# Patient Record
Sex: Male | Born: 1955
Health system: Southern US, Community
[De-identification: ages and names within clinical notes are randomized; demographics above are authoritative.]

## PROBLEM LIST (undated history)

## (undated) DIAGNOSIS — M4150 Other secondary scoliosis, site unspecified: Secondary | ICD-10-CM

## (undated) DIAGNOSIS — C439 Malignant melanoma of skin, unspecified: Secondary | ICD-10-CM

## (undated) DIAGNOSIS — M48061 Spinal stenosis, lumbar region without neurogenic claudication: Secondary | ICD-10-CM

## (undated) DIAGNOSIS — N200 Calculus of kidney: Secondary | ICD-10-CM

## (undated) DIAGNOSIS — K219 Gastro-esophageal reflux disease without esophagitis: Secondary | ICD-10-CM

## (undated) DIAGNOSIS — M48062 Spinal stenosis, lumbar region with neurogenic claudication: Secondary | ICD-10-CM

## (undated) DIAGNOSIS — IMO0002 Reserved for concepts with insufficient information to code with codable children: Secondary | ICD-10-CM

## (undated) DIAGNOSIS — Z87442 Personal history of urinary calculi: Secondary | ICD-10-CM

## (undated) DIAGNOSIS — N529 Male erectile dysfunction, unspecified: Secondary | ICD-10-CM

## (undated) DIAGNOSIS — K5790 Diverticulosis of intestine, part unspecified, without perforation or abscess without bleeding: Secondary | ICD-10-CM

## (undated) HISTORY — DX: Male erectile dysfunction, unspecified: N52.9

## (undated) HISTORY — PX: LUMBAR DISC SURGERY: SHX700

## (undated) HISTORY — DX: Reserved for concepts with insufficient information to code with codable children: IMO0002

## (undated) HISTORY — PX: MELANOMA EXCISION: SHX5266

## (undated) HISTORY — DX: Spinal stenosis, lumbar region with neurogenic claudication: M48.062

## (undated) HISTORY — DX: Spinal stenosis, lumbar region without neurogenic claudication: M48.061

## (undated) HISTORY — DX: Other secondary scoliosis, site unspecified: M41.50

## (undated) HISTORY — DX: Malignant melanoma of skin, unspecified: C43.9

## (undated) HISTORY — DX: Gastro-esophageal reflux disease without esophagitis: K21.9

## (undated) HISTORY — DX: Calculus of kidney: N20.0

## (undated) HISTORY — DX: Diverticulosis of intestine, part unspecified, without perforation or abscess without bleeding: K57.90

---

## 2004-10-05 ENCOUNTER — Encounter
Admission: RE | Admit: 2004-10-05 | Discharge: 2004-10-05 | Payer: Self-pay | Admitting: Physical Medicine and Rehabilitation

## 2005-04-06 ENCOUNTER — Emergency Department (HOSPITAL_COMMUNITY): Admission: EM | Admit: 2005-04-06 | Discharge: 2005-04-06 | Payer: Self-pay | Admitting: Emergency Medicine

## 2005-07-26 ENCOUNTER — Ambulatory Visit: Payer: Self-pay | Admitting: Gastroenterology

## 2005-08-09 ENCOUNTER — Ambulatory Visit: Payer: Self-pay | Admitting: Gastroenterology

## 2005-08-09 LAB — HM COLONOSCOPY

## 2006-02-05 ENCOUNTER — Ambulatory Visit: Payer: Self-pay | Admitting: Family Medicine

## 2006-03-26 HISTORY — PX: KNEE ARTHROSCOPY: SUR90

## 2006-10-04 ENCOUNTER — Encounter: Admission: RE | Admit: 2006-10-04 | Discharge: 2006-10-04 | Payer: Self-pay | Admitting: Orthopedic Surgery

## 2007-01-23 ENCOUNTER — Ambulatory Visit: Payer: Self-pay | Admitting: Family Medicine

## 2007-04-29 ENCOUNTER — Ambulatory Visit: Payer: Self-pay | Admitting: Family Medicine

## 2007-11-18 ENCOUNTER — Ambulatory Visit: Payer: Self-pay | Admitting: Family Medicine

## 2008-06-09 ENCOUNTER — Ambulatory Visit: Payer: Self-pay | Admitting: Family Medicine

## 2008-07-30 ENCOUNTER — Emergency Department (HOSPITAL_COMMUNITY): Admission: EM | Admit: 2008-07-30 | Discharge: 2008-07-30 | Payer: Self-pay | Admitting: Emergency Medicine

## 2008-08-02 ENCOUNTER — Ambulatory Visit: Payer: Self-pay | Admitting: Family Medicine

## 2008-11-15 ENCOUNTER — Ambulatory Visit: Payer: Self-pay | Admitting: Family Medicine

## 2009-03-11 ENCOUNTER — Ambulatory Visit: Payer: Self-pay | Admitting: Family Medicine

## 2009-04-20 ENCOUNTER — Ambulatory Visit: Payer: Self-pay | Admitting: Family Medicine

## 2010-04-19 ENCOUNTER — Ambulatory Visit
Admission: RE | Admit: 2010-04-19 | Discharge: 2010-04-19 | Payer: Self-pay | Source: Home / Self Care | Attending: Family Medicine | Admitting: Family Medicine

## 2010-07-04 LAB — CBC
HCT: 35.5 % — ABNORMAL LOW (ref 39.0–52.0)
Hemoglobin: 12.4 g/dL — ABNORMAL LOW (ref 13.0–17.0)
MCHC: 34.8 g/dL (ref 30.0–36.0)
MCV: 92.9 fL (ref 78.0–100.0)
Platelets: 288 10*3/uL (ref 150–400)
RBC: 3.82 MIL/uL — ABNORMAL LOW (ref 4.22–5.81)
RDW: 13.2 % (ref 11.5–15.5)
WBC: 12.9 10*3/uL — ABNORMAL HIGH (ref 4.0–10.5)

## 2010-07-04 LAB — URINALYSIS, ROUTINE W REFLEX MICROSCOPIC
Bilirubin Urine: NEGATIVE
Glucose, UA: NEGATIVE mg/dL
Ketones, ur: 40 mg/dL — AB
Leukocytes, UA: NEGATIVE
Nitrite: NEGATIVE
Protein, ur: NEGATIVE mg/dL
Specific Gravity, Urine: 1.023 (ref 1.005–1.030)
Urobilinogen, UA: 0.2 mg/dL (ref 0.0–1.0)
pH: 6 (ref 5.0–8.0)

## 2010-07-04 LAB — DIFFERENTIAL
Basophils Absolute: 0 10*3/uL (ref 0.0–0.1)
Basophils Relative: 0 % (ref 0–1)
Eosinophils Absolute: 0 10*3/uL (ref 0.0–0.7)
Eosinophils Relative: 0 % (ref 0–5)
Lymphocytes Relative: 8 % — ABNORMAL LOW (ref 12–46)
Lymphs Abs: 1 10*3/uL (ref 0.7–4.0)
Monocytes Absolute: 0.4 10*3/uL (ref 0.1–1.0)
Monocytes Relative: 3 % (ref 3–12)
Neutro Abs: 11.5 10*3/uL — ABNORMAL HIGH (ref 1.7–7.7)
Neutrophils Relative %: 89 % — ABNORMAL HIGH (ref 43–77)

## 2010-07-04 LAB — COMPREHENSIVE METABOLIC PANEL
ALT: 26 U/L (ref 0–53)
AST: 35 U/L (ref 0–37)
Albumin: 4.2 g/dL (ref 3.5–5.2)
Alkaline Phosphatase: 73 U/L (ref 39–117)
BUN: 13 mg/dL (ref 6–23)
CO2: 23 mEq/L (ref 19–32)
Calcium: 9 mg/dL (ref 8.4–10.5)
Chloride: 104 mEq/L (ref 96–112)
Creatinine, Ser: 1.31 mg/dL (ref 0.4–1.5)
GFR calc Af Amer: >60 mL/min (ref >60)
GFR calc non Af Amer: 57 mL/min — ABNORMAL LOW (ref >60)
Glucose, Bld: 144 mg/dL — ABNORMAL HIGH (ref 70–99)
Potassium: 4.3 mEq/L (ref 3.5–5.1)
Sodium: 134 mEq/L — ABNORMAL LOW (ref 135–145)
Total Bilirubin: 0.8 mg/dL (ref 0.3–1.2)
Total Protein: 6.3 g/dL (ref 6.0–8.3)

## 2010-07-04 LAB — URINE MICROSCOPIC-ADD ON

## 2011-01-18 ENCOUNTER — Encounter: Payer: Self-pay | Admitting: Family Medicine

## 2011-01-30 ENCOUNTER — Ambulatory Visit (INDEPENDENT_AMBULATORY_CARE_PROVIDER_SITE_OTHER): Payer: 59 | Admitting: Family Medicine

## 2011-01-30 ENCOUNTER — Encounter: Payer: Self-pay | Admitting: Family Medicine

## 2011-01-30 VITALS — BP 120/82 | HR 62 | Wt 186.0 lb

## 2011-01-30 DIAGNOSIS — M545 Low back pain: Secondary | ICD-10-CM

## 2011-01-30 MED ORDER — TRAMADOL HCL 50 MG PO TABS: 50.0000 mg | ORAL_TABLET | Freq: Four times a day (QID) | ORAL | Status: DC | PRN

## 2011-01-30 MED ORDER — CARISOPRODOL 350 MG PO TABS: 350.0000 mg | ORAL_TABLET | Freq: Three times a day (TID) | ORAL | Status: AC | PRN

## 2011-01-30 NOTE — Progress Notes (Signed)
  Subjective:    [name], male    DOB: 1956-02-06, 55 y.o.   MRN: [id number]  HPI Is here for evaluation of continued difficulty with a several month history of low back pain with radiation down his right leg. He has been through physical therapy. He also was seen by Dr. Charlett Blake and given a steroid dose pack. He continues to have back pain radiating down his right leg. He was diagnosed with scoliosis. The pain has gotten much worse in the last month he also complains of decreased sensation over the dorsum of his right foot but no motor weakness to he has a previous history of back surgery for left-sided HNP. In the last week his symptoms have been worse with increased pain with position changes. He continues on Celebrex but states it is not helping much.  Review of Systems     Objective:   Physical Exam Exam of the back shows no pain on palpation with pain on motion of his back. Negative straight leg raising. Decreased sensation over the dorsum of his foot. Ankle reflex on the right is normal in gone on the left. Normal strength.      Assessment & Plan:   1. Low back pain radiating to right leg  MR Lumbar Spine W Wo Contrast   I will also give him tramadol and soma. Probable referral to neurosurgery based on the MRI.

## 2011-02-08 ENCOUNTER — Ambulatory Visit
Admission: RE | Admit: 2011-02-08 | Discharge: 2011-02-08 | Disposition: A | Discharge status: Home / Self Care | Payer: 59 | Source: Ambulatory Visit | Attending: Family Medicine | Admitting: Family Medicine

## 2011-02-08 DIAGNOSIS — M545 Low back pain: Secondary | ICD-10-CM

## 2011-02-08 MED ORDER — GADOBENATE DIMEGLUMINE 529 MG/ML IV SOLN
17.0000 mL | Freq: Once | INTRAVENOUS | Status: AC | PRN
  Administered 2011-02-08: 17 mL via INTRAVENOUS

## 2011-02-10 NOTE — Progress Notes (Signed)
I can refer him to someone else,however most surgeons are reluctant to operate on someone who has already had an operation by another surgeon. Dr Dutch Quint is a good doc but if he wants, I'll refer to someone else and I'll talk to him if he wants

## 2011-03-22 ENCOUNTER — Telehealth: Payer: Self-pay | Admitting: Internal Medicine

## 2011-03-22 MED ORDER — CARISOPRODOL 350 MG PO TABS: 350.0000 mg | ORAL_TABLET | Freq: Three times a day (TID) | ORAL | Status: DC | PRN

## 2011-03-22 NOTE — Telephone Encounter (Signed)
Called soma in per jcl

## 2011-03-28 ENCOUNTER — Telehealth: Payer: Self-pay | Admitting: Internal Medicine

## 2011-03-28 MED ORDER — CARISOPRODOL 350 MG PO TABS: 350.0000 mg | ORAL_TABLET | Freq: Three times a day (TID) | ORAL | Status: AC | PRN

## 2011-03-28 MED ORDER — CELECOXIB 200 MG PO CAPS: 200.0000 mg | ORAL_CAPSULE | Freq: Two times a day (BID) | ORAL | Status: DC

## 2011-03-28 NOTE — Telephone Encounter (Signed)
CALLED MED IN  

## 2011-03-28 NOTE — Telephone Encounter (Signed)
CALLED MED IN PER JCL 

## 2011-03-28 NOTE — Telephone Encounter (Signed)
Call in both of these medications with 5 refills

## 2011-04-20 ENCOUNTER — Encounter: Payer: Self-pay | Admitting: Internal Medicine

## 2011-04-24 ENCOUNTER — Encounter: Payer: 59 | Admitting: Family Medicine

## 2011-04-26 ENCOUNTER — Encounter: Payer: Self-pay | Admitting: Family Medicine

## 2011-04-26 ENCOUNTER — Ambulatory Visit (INDEPENDENT_AMBULATORY_CARE_PROVIDER_SITE_OTHER): Payer: 59 | Admitting: Family Medicine

## 2011-04-26 VITALS — BP 122/80 | HR 76 | Ht >= 80 in | Wt 183.0 lb

## 2011-04-26 DIAGNOSIS — Z0000 Encounter for general adult medical examination without abnormal findings: Secondary | ICD-10-CM

## 2011-04-26 DIAGNOSIS — K219 Gastro-esophageal reflux disease without esophagitis: Secondary | ICD-10-CM

## 2011-04-26 DIAGNOSIS — G8929 Other chronic pain: Secondary | ICD-10-CM | POA: Insufficient documentation

## 2011-04-26 DIAGNOSIS — Z23 Encounter for immunization: Secondary | ICD-10-CM

## 2011-04-26 DIAGNOSIS — N529 Male erectile dysfunction, unspecified: Secondary | ICD-10-CM

## 2011-04-26 DIAGNOSIS — M549 Dorsalgia, unspecified: Secondary | ICD-10-CM

## 2011-04-26 DIAGNOSIS — Z87442 Personal history of urinary calculi: Secondary | ICD-10-CM

## 2011-04-26 LAB — POC HEMOCCULT BLD/STL (OFFICE/1-CARD/DIAGNOSTIC): Fecal Occult Blood, POC: NEGATIVE

## 2011-04-26 MED ORDER — CARISOPRODOL 350 MG PO TABS: 350.0000 mg | ORAL_TABLET | Freq: Four times a day (QID) | ORAL | Status: DC | PRN

## 2011-04-26 MED ORDER — CELECOXIB 200 MG PO CAPS: 200.0000 mg | ORAL_CAPSULE | Freq: Two times a day (BID) | ORAL | Status: DC

## 2011-04-26 MED ORDER — TADALAFIL 20 MG PO TABS: 20.0000 mg | ORAL_TABLET | Freq: Every day | ORAL | Status: DC | PRN

## 2011-04-26 NOTE — Progress Notes (Signed)
  Subjective:    [name], male    DOB: Jun 17, 1955, 56 y.o.   MRN: [id number]  HPI He is here for complete examination. He is having some URI symptoms with cough, nasal congestion, or rhinorrhea. His reflux is giving him very little difficulty. He would like to have a refill on his Cialis. He has a remote history of kidney stones. He continues to have difficulty with back pain and finds good relief with Celebrex and occasional use of soma. He continues on Propecia for his hair loss. Sees a dermatologist for this. Review of his record indicates his last lab work looked good including PSA   Review of Systems  Constitutional: Negative.   HENT: Negative.   Eyes: Negative.   Respiratory: Negative.   Gastrointestinal: Negative.   Genitourinary: Negative.   Musculoskeletal: Positive for back pain.  Neurological: Negative.   Hematological: Negative.   Psychiatric/Behavioral: Negative.        Objective:   Physical Exam BP 122/80  Pulse 76  Ht 6\' 8"  (2.032 m)  Wt 183 lb (83.008 kg)  BMI 20.10 kg/m2  General Appearance:    Alert, cooperative, no distress, appears stated age  Head:    Normocephalic, without obvious abnormality, atraumatic  Eyes:    PERRL, conjunctiva/corneas clear, EOM's intact, fundi    benign  Ears:    Normal TM's and external ear canals  Nose:   Nares normal, mucosa normal, no drainage or sinus   tenderness  Throat:   Lips, mucosa, and tongue normal; teeth and gums normal  Neck:   Supple, no lymphadenopathy;  thyroid:  no   enlargement/tenderness/nodules; no carotid   bruit or JVD  Back:    Spine nontender, no curvature, ROM normal, no CVA     tenderness  Lungs:     Clear to auscultation bilaterally without wheezes, rales or     ronchi; respirations unlabored  Chest Wall:    No tenderness or deformity   Heart:    Regular rate and rhythm, S1 and S2 normal, no murmur, rub   or gallop  Breast Exam:    No chest wall tenderness, masses or gynecomastia    Abdomen:     Soft, non-tender, nondistended, normoactive bowel sounds,    no masses, no hepatosplenomegaly  Genitalia:    Normal male external genitalia without lesions.  Testicles without masses.  No inguinal hernias.  Rectal:    Normal sphincter tone, no masses or tenderness; guaiac negative stool.  Prostate smooth, no nodules, not enlarged.  Extremities:   No clubbing, cyanosis or edema  Pulses:   2+ and symmetric all extremities  Skin:   Skin color, texture, turgor normal, no rashes or lesions  Lymph nodes:   Cervical, supraclavicular, and axillary nodes normal  Neurologic:   CNII-XII intact, normal strength, sensation and gait; reflexes 2+ and symmetric throughout          Psych:   Normal mood, affect, hygiene and grooming.           Assessment & Plan:   1. Routine general medical examination at a health care facility   2. GERD (gastroesophageal reflux disease)   3. ED (erectile dysfunction)   4. History of kidney stones   5. Chronic back pain   6  URI

## 2011-04-26 NOTE — Patient Instructions (Signed)
Continue taking good care of yourself

## 2011-05-17 ENCOUNTER — Encounter: Payer: Self-pay | Admitting: Family Medicine

## 2011-05-17 ENCOUNTER — Ambulatory Visit (INDEPENDENT_AMBULATORY_CARE_PROVIDER_SITE_OTHER): Payer: 59 | Admitting: Family Medicine

## 2011-05-17 VITALS — BP 120/82 | HR 80 | Temp 98.0°F | Wt 190.0 lb

## 2011-05-17 DIAGNOSIS — J209 Acute bronchitis, unspecified: Secondary | ICD-10-CM

## 2011-05-17 MED ORDER — AZITHROMYCIN 500 MG PO TABS: 500.0000 mg | ORAL_TABLET | Freq: Every day | ORAL | Status: AC

## 2011-05-17 NOTE — Progress Notes (Signed)
  Subjective:    [name], male    DOB: October 27, 1955, 56 y.o.   MRN: [id number]  HPI He has a 4 week history of sneezing,, hoarse voice, nasal congestion and dry cough. No fever, chills, headache or sore throat. He does not smoke.   Review of Systems     Objective:   Physical Exam alert and in no distress. Tympanic membranes and canals are normal. Throat is clear. Tonsils are normal. Neck is supple without adenopathy or thyromegaly. Cardiac exam shows a regular sinus rhythm without murmurs or gallops. Lungs are clear to auscultation.        Assessment & Plan:   1. Bronchitis, acute    I will treat him with azithromycin. He is to call if not entirely better.

## 2011-05-17 NOTE — Patient Instructions (Signed)
Take the antibiotic for 3 days but is still having difficulty 10 days down the road call me . Keep drinking plenty of fluids.

## 2011-09-24 ENCOUNTER — Other Ambulatory Visit: Payer: Self-pay | Admitting: Family Medicine

## 2011-11-06 ENCOUNTER — Other Ambulatory Visit: Payer: Self-pay | Admitting: Medical

## 2011-11-06 ENCOUNTER — Other Ambulatory Visit: Payer: Self-pay | Admitting: Family Medicine

## 2011-11-06 ENCOUNTER — Ambulatory Visit (INDEPENDENT_AMBULATORY_CARE_PROVIDER_SITE_OTHER): Payer: 59 | Admitting: Medical

## 2011-11-06 ENCOUNTER — Encounter: Payer: Self-pay | Admitting: Medical

## 2011-11-06 VITALS — BP 118/80 | HR 78 | Temp 98.3°F | Resp 16 | Wt 194.0 lb

## 2011-11-06 DIAGNOSIS — L0291 Cutaneous abscess, unspecified: Secondary | ICD-10-CM

## 2011-11-06 DIAGNOSIS — M7030 Other bursitis of elbow, unspecified elbow: Secondary | ICD-10-CM

## 2011-11-06 DIAGNOSIS — L0390 Cellulitis, unspecified: Secondary | ICD-10-CM

## 2011-11-06 DIAGNOSIS — M7020 Olecranon bursitis, unspecified elbow: Secondary | ICD-10-CM

## 2011-11-06 MED ORDER — SULFAMETHOXAZOLE-TRIMETHOPRIM 800-160 MG PO TABS: 1.0000 | ORAL_TABLET | Freq: Two times a day (BID) | ORAL | Status: AC

## 2011-11-06 NOTE — Progress Notes (Signed)
Subjective:   HPI  Joseph [name] is a 56 y.o. male who presents for c/o swollen elbow, low grade x 2wk, but worse in the last 2 days.  Was trimming hedges with hand clipper the last 2 days, and now the elbow is swollen and red.   Thinks its bursitis.   Has had similar in the past, thinks he was on steroid in the past for this.  Is swollen and tender, can't rest the elbow on anything.  Using nothing for symptoms.  He does use Celebrex routinely for joint pains but its not helping.  No other aggravating or relieving factors.  No recent trauma or injury.  He reports seeing dermatology in the last month for scalp lesions was treated with 2 rounds of doxycyline for staph infection.  No other c/o.  The following portions of the patient's history were reviewed and updated as appropriate: allergies, current medications, past family history, past medical history, past social history, past surgical history and problem list.  Past Medical History  Diagnosis Date  . DDD (degenerative disc disease)   . Myeloma   . GERD (gastroesophageal reflux disease)   . Alopecia   . ED (erectile dysfunction)   . Diverticulosis   . Renal stone     Allergies  Allergen Reactions  . Daypro (Oxaprozin)     GASTRIC PROBLEMS  . Methadone Itching     Review of Systems Gen: no fever, chills GI: no NVD MSK: did notice some drainage ROS reviewed and was negative other than noted in HPI or above.    Objective:   Physical Exam  General appearance: alert, no distress, WD/WN Skin: left elbow with scaling, generalized warmth and erythema MSK: swollen left elbow with fluctuance, pain with elbow flexion, tender over same area, forearm seems somewhat swollen compared to right, otherwise arm nontender, unremarkable Neuro: normal left arm strength and sensation LE pulses normal   Assessment and Plan :     Encounter Diagnoses  Name Primary?  . Bursitis of elbow Yes  . Cellulitis    Discussed case with Dr.  Susann Givens who also examined pt.  Discussed diagnosis, treatment options.   We suggested aspiration.   discussed risks/benefits and he agrees to aspiration.   Cleaned and prepped the left elbow in usual sterile fashion.  Use 18 gauge needle to aspirate approx 3cc of serous fluid from the left elbow fluctuance area over the bursa.   Covered with bandage and pt tolerated procedure well.   Procedure supervised by Dr. Susann Givens.  Begin Bactrim, use warm compresses, elevation, and compression with ACE wrap OTC.  wil send fluid for culture and cytology.  F/u in 2-3 days, call sooner if worse.

## 2011-11-07 LAB — SYNOVIAL CELL COUNT + DIFF, W/ CRYSTALS
Eosinophils-Synovial: 0 % (ref 0–1)
Lymphocytes-Synovial Fld: 9 % (ref 0–20)
Monocyte/Macrophage: 6 % — ABNORMAL LOW (ref 50–90)
Neutrophil, Synovial: 85 % — ABNORMAL HIGH (ref 0–25)
WBC, Synovial: 850 cu mm — ABNORMAL HIGH (ref 0–200)

## 2011-11-08 ENCOUNTER — Ambulatory Visit (INDEPENDENT_AMBULATORY_CARE_PROVIDER_SITE_OTHER): Payer: 59 | Admitting: Medical

## 2011-11-08 ENCOUNTER — Encounter: Payer: Self-pay | Admitting: Medical

## 2011-11-08 VITALS — BP 112/70 | HR 88 | Temp 98.0°F | Resp 16

## 2011-11-08 DIAGNOSIS — L0390 Cellulitis, unspecified: Secondary | ICD-10-CM

## 2011-11-08 DIAGNOSIS — M7020 Olecranon bursitis, unspecified elbow: Secondary | ICD-10-CM

## 2011-11-08 DIAGNOSIS — L0291 Cutaneous abscess, unspecified: Secondary | ICD-10-CM

## 2011-11-08 NOTE — Progress Notes (Signed)
Subjective:   HPI  Joseph [name] is a 56 y.o. male who presents for recheck.  I saw him 2 days ago for swollen left elbow suggesting cellulitis and inflamed bursa.  He has used heat a few times, has taken 5 doses of Bactrim now, but has not used compression or elevation.   He feels like it is somewhat improved.  still swollen and a little red.  No other aggravating or relieving factors.  No recent trauma or injury.   No other c/o.  The following portions of the patient's history were reviewed and updated as appropriate: allergies, current medications, past family history, past medical history, past social history, past surgical history and problem list.  Past Medical History  Diagnosis Date  . DDD (degenerative disc disease)   . Myeloma   . GERD (gastroesophageal reflux disease)   . Alopecia   . ED (erectile dysfunction)   . Diverticulosis   . Renal stone     Allergies  Allergen Reactions  . Daypro (Oxaprozin)     GASTRIC PROBLEMS  . Methadone Itching     Review of Systems Gen: no fever, chills GI: no NVD MSK: did notice some drainage ROS reviewed and was negative other than noted in HPI or above.    Objective:   Physical Exam  General appearance: alert, no distress, WD/WN Skin: left elbow with scaling, less warmth and erythema today, more pinkish appearing today MSK: moderately swollen left elbow with fluctuance over the olecranon bursa, mild pain with elbow flexion, tender over same area, forearm seems somewhat swollen compared to right, otherwise arm nontender, unremarkable Neuro: normal left arm strength and sensation LE pulses normal   Assessment and Plan :     Encounter Diagnoses  Name Primary?  Marland Kitchen Olecranon bursitis Yes  . Cellulitis    Improving.  I reviewed the cytology report.  Still awaiting culture.   c/t Bactrim, use warm compresses, elevation, and compression with ACE wrap OTC.  I applied the ACE wrap today.   If not improved in 24 hours, call back.   Otherwise, stay the course and f/u pending culture results or return if worse sooner.

## 2011-11-10 LAB — BODY FLUID CULTURE
Gram Stain: NONE SEEN
Gram Stain: NONE SEEN
Organism ID, Bacteria: NO GROWTH

## 2012-03-03 ENCOUNTER — Other Ambulatory Visit: Payer: Self-pay | Admitting: Family Medicine

## 2012-03-03 NOTE — Telephone Encounter (Signed)
Is this okay?

## 2012-03-03 NOTE — Telephone Encounter (Signed)
ok 

## 2012-03-04 NOTE — Telephone Encounter (Signed)
Called in med 

## 2012-03-29 ENCOUNTER — Other Ambulatory Visit: Payer: Self-pay | Admitting: Family Medicine

## 2012-03-31 NOTE — Telephone Encounter (Signed)
IS THIS OK 

## 2012-04-08 ENCOUNTER — Encounter: Payer: Self-pay | Admitting: Family Medicine

## 2012-04-08 ENCOUNTER — Ambulatory Visit (INDEPENDENT_AMBULATORY_CARE_PROVIDER_SITE_OTHER): Payer: 59 | Admitting: Family Medicine

## 2012-04-08 VITALS — BP 112/78 | HR 86 | Wt 203.0 lb

## 2012-04-08 DIAGNOSIS — M722 Plantar fascial fibromatosis: Secondary | ICD-10-CM

## 2012-04-08 NOTE — Progress Notes (Signed)
  Subjective:    [name], male    DOB: Nov 18, 1955, 57 y.o.   MRN: [id number]  HPI He complains of a two-week history of right heel pain. He murmurs no particular injury but did note that this occurred after he did some brisk walking for short period of time. He notes the pain is worse in the morning when he gets out of bed or if he sits for long period of time.  Review of Systems     Objective:   Physical Exam No tenderness to palpation of the Achilles tendon, retrocalcaneal area. Negative Homan's sign. Tenderness to palpation over the calcaneal spur. Good motion of the foot and forefoot.      Assessment & Plan:   1. Plantar fasciitis of right foot    instructions on the heel cord stretching, kneeding it in the calcaneal area, using heel cups and if no pubic arch supports. He will return here if continued difficulty.

## 2012-04-08 NOTE — Patient Instructions (Signed)

## 2012-09-30 ENCOUNTER — Encounter: Payer: Self-pay | Admitting: Medical

## 2012-09-30 ENCOUNTER — Ambulatory Visit (INDEPENDENT_AMBULATORY_CARE_PROVIDER_SITE_OTHER): Payer: 59 | Admitting: Medical

## 2012-09-30 VITALS — BP 110/80 | HR 78 | Temp 98.3°F | Resp 16 | Wt 202.0 lb

## 2012-09-30 DIAGNOSIS — J069 Acute upper respiratory infection, unspecified: Secondary | ICD-10-CM

## 2012-09-30 NOTE — Progress Notes (Signed)
Subjective:  Joseph [name] is a 57 y.o. male who presents for 3 wk hx/o cough, intermittent, some nasal congestion, some sneezing, phlegm in throat.  No fever, NVD, wheezing, SOB, ear pressure, sore throat.  Does have cat in the home.  Has had some sick contacts.   Treatment to date: none.   No other aggravating or relieving factors.  No other c/o.  Past Medical History  Diagnosis Date  . DDD (degenerative disc disease)   . Myeloma   . GERD (gastroesophageal reflux disease)   . Alopecia   . ED (erectile dysfunction)   . Diverticulosis   . Renal stone    ROS as in subjective  Objective:  Filed Vitals:   09/30/12 1438  BP: 110/80  Pulse: 78  Temp: 98.3 F (36.8 C)  Resp: 16    General appearance: Alert, WD/WN, no distress                             Skin: warm, no rash                           Head: no sinus tenderness                            Eyes: conjunctiva normal, corneas clear, PERRLA                            Ears: pearly TMs, external ear canals normal                          Nose: septum midline, turbinates normal, with mild erythema and crusted dry discharge             Mouth/throat: MMM, tongue normal, mild pharyngeal erythema                           Neck: supple, no adenopathy, no thyromegaly, nontender                          Heart: RRR, normal S1, S2, no murmurs                         Lungs: CTA bilaterally, no wheezes, rales, or rhonchi     Assessment and Plan: Encounter Diagnosis  Name Primary?  Marland Kitchen Upper respiratory infection Yes    Discussed diagnosis and treatment of URI.  Suggested symptomatic OTC remedies, begin OTC mucinex DM, nasal saline flush.  Call or return in 2-3 days if symptoms aren't resolving.

## 2012-10-13 ENCOUNTER — Other Ambulatory Visit: Payer: Self-pay | Admitting: Family Medicine

## 2012-12-12 ENCOUNTER — Other Ambulatory Visit: Payer: Self-pay | Admitting: Family Medicine

## 2012-12-15 NOTE — Telephone Encounter (Signed)
IS THIS OK 

## 2012-12-15 NOTE — Telephone Encounter (Signed)
Okay to renew this

## 2013-02-12 ENCOUNTER — Ambulatory Visit (INDEPENDENT_AMBULATORY_CARE_PROVIDER_SITE_OTHER): Payer: 59 | Admitting: Family Medicine

## 2013-02-12 ENCOUNTER — Encounter: Payer: Self-pay | Admitting: Family Medicine

## 2013-02-12 VITALS — BP 122/86 | HR 72 | Temp 97.9°F | Ht 77.5 in | Wt 203.0 lb

## 2013-02-12 DIAGNOSIS — Z23 Encounter for immunization: Secondary | ICD-10-CM

## 2013-02-12 DIAGNOSIS — J069 Acute upper respiratory infection, unspecified: Secondary | ICD-10-CM

## 2013-02-12 NOTE — Patient Instructions (Signed)
  Drink plenty of fluids Continue to use guaifenesin as expectorant to loosen phlegm. Use nasal saline to keep mucus membranes moist and prevent bleeding. Consider use of sinus rinses once or twice daily. Use decongestants only if you develop sinus pain. Okay to continue dextromethorphan as cough suppressant (Nyquil at bedtime is fine, but don't use along with the guaif-DM combination or you will be getting too much DM).  Call next week if symptoms persist/worsen  Return for re-evaluation if high fevers, shortness of breath, chest pain, or other new concerns develop

## 2013-02-12 NOTE — Progress Notes (Signed)
Chief Complaint  Patient presents with  . Cough    hoarseness, sneezing, has mucus that is yellow-green and sometimes blood tinged. Some real milky white stuff when he blows his nose.    He has been sick for a week with cough, yellow-green phlegm with occasional streak of blood.  He has had hoarse voice, sneezing, nasal congestion.  Nasal drainage is milky white along with yellow-green mucus., sometimes with bloody streaks.  +PND.  He has had pressure over his temples.  Denies fevers, ear pain, sore throat, wheezing, shortness of breath.  No myalgias.  No sinus pain.  Over the last week symptoms haven't improved much, hasn't worsened, some days are better than others.  Feels better today, slept well last night.  He has h/o pneumonia, last was a few years ago. +sick contacts at work  He has been taking Mucinex-DM during day, and Nyquil (generic, containing just doxylamine and dextromethorphan) at night  Past Medical History  Diagnosis Date  . DDD (degenerative disc disease)   . Melanoma     sees Dr. Margo Aye.  back and chest  . GERD (gastroesophageal reflux disease)   . Alopecia   . ED (erectile dysfunction)   . Diverticulosis   . Renal stone    Past Surgical History  Procedure Laterality Date  . Knee arthroscopy  2008    RIGHT  . Melanoma excision    . Lumbar disc surgery     History   Social History  . Marital Status: Married    Spouse Name: N/A    Number of Children: N/A  . Years of Education: N/A   Occupational History  . Not on file.   Social History Main Topics  . Smoking status: Former Smoker -- 1.00 packs/day    Quit date: 04/25/1994  . Smokeless tobacco: Not on file  . Alcohol Use: 2.4 oz/week    4 Cans of beer per week  . Drug Use: No  . Sexual Activity: Yes   Other Topics Concern  . Not on file   Social History Narrative  . No narrative on file     Current outpatient prescriptions:carisoprodol (SOMA) 350 MG tablet, TAKE 1 TABLET BY MOUTH 3 TIMES A DAY  AS NEEDED FOR MUSCLE SPASM, Disp: 30 tablet, Rfl: 2;  CELEBREX 200 MG capsule, TAKE 1 CAPSULE BY MOUTH TWICE DAILY, Disp: 60 capsule, Rfl: 5;  cholecalciferol (VITAMIN D) 1000 UNITS tablet, Take 1,000 Units by mouth daily., Disp: , Rfl:  dextromethorphan (DELSYM) 30 MG/5ML liquid, Take 30 mg by mouth at bedtime as needed for cough., Disp: , Rfl: ;  dextromethorphan-guaiFENesin (MUCINEX DM) 30-600 MG per 12 hr tablet, Take 1 tablet by mouth 2 (two) times daily., Disp: , Rfl: ;  finasteride (PROPECIA) 1 MG tablet, Take 1 mg by mouth daily. 1/2 TABLET DAILY , Disp: , Rfl: ;  fish oil-omega-3 fatty acids 1000 MG capsule, Take 2 g by mouth daily.  , Disp: , Rfl:  glucosamine-chondroitin 500-400 MG tablet, Take 1 tablet by mouth 3 (three) times daily.  , Disp: , Rfl: ;  Multiple Vitamins-Minerals (MULTIVITAMIN WITH MINERALS) tablet, Take 1 tablet by mouth daily.  , Disp: , Rfl: ;  selenium 50 MCG TABS, Take 50 mcg by mouth daily.  , Disp: , Rfl: ;  vitamin C (ASCORBIC ACID) 500 MG tablet, Take 500 mg by mouth daily., Disp: , Rfl: ;  vitamin E 100 UNIT capsule, Take 100 Units by mouth daily., Disp: , Rfl:  ferrous sulfate  325 (65 FE) MG tablet, Take 325 mg by mouth daily with breakfast.  , Disp: , Rfl:  (not taking iron)   Allergies  Allergen Reactions  . Daypro [Oxaprozin]     GASTRIC PROBLEMS  . Methadone Itching   ROS:  No fevers, chills, chest pain, shortness of breath, nausea, vomiting, diarrhea or abdominal pain. No skin rashes, chest pain, palpitations, edema or other concerns.  PHYSICAL EXAM:  BP 122/86  Pulse 72  Temp(Src) 97.9 F (36.6 C) (Oral)  Ht 6' 5.5" (1.969 m)  Wt 203 lb (92.08 kg)  BMI 23.75 kg/m2  Well developed, pleasant male with slightly hoarse voice, no coughing, in no distress HEENT: PERRL, EOMI, conjunctiva clear.  TM's and EAC's normal. Nasal mucosa mildly edematous, with irritation and recent bleed at septum on right.  Thick white mucus in left nares.  Sinuses nontender.   OP clear Neck: no lymphadenopathy, thyromegaly Heart: regular rate and rhythm without murmur Lungs: clear bilaterally, no wheezes, rales or ronchi.  No wheeze with forced expiration Skin: no rash Neuro: alert and oriented.  Cranial nerves intact. Normal strength, gait  ASSESSMENT/PLAN:  Acute upper respiratory infections of unspecified site  Need for prophylactic vaccination and inoculation against influenza - Plan: Flu Vaccine QUAD 36+ mos PF IM (Fluarix)  Drink plenty of fluids Continue to use guaifenesin as expectorant to loosen phlegm. Use nasal saline to keep mucus membranes moist and prevent bleeding. Consider use of sinus rinses once or twice daily. Use decongestants only if you develop sinus pain. Okay to continue dextromethorphan as cough suppressant (Nyquil at bedtime is fine, but don't use along with the guaif-DM combination or you will be getting too much DM).  Call next week if symptoms persist/worsen  Return for re-evaluation if high fevers, shortness of breath, chest pain, or other new concerns develop

## 2013-03-23 ENCOUNTER — Ambulatory Visit (INDEPENDENT_AMBULATORY_CARE_PROVIDER_SITE_OTHER): Payer: 59 | Admitting: Medical

## 2013-03-23 ENCOUNTER — Encounter: Payer: Self-pay | Admitting: Medical

## 2013-03-23 VITALS — BP 130/80 | HR 68 | Temp 98.0°F | Resp 16 | Wt 203.0 lb

## 2013-03-23 DIAGNOSIS — R05 Cough: Secondary | ICD-10-CM

## 2013-03-23 MED ORDER — ALBUTEROL SULFATE HFA 108 (90 BASE) MCG/ACT IN AERS: 2.0000 | INHALATION_SPRAY | Freq: Four times a day (QID) | RESPIRATORY_TRACT | Status: DC | PRN

## 2013-03-23 NOTE — Progress Notes (Signed)
Subjective: Here for cough x 2 wk.  Was seen here the week of Thanksgiving for cough, got over this, then about 2 weeks later started getting cough again.  He is a nonsmoker. Currently he is having cough throughout the day, at times bad cough spells, some nasal congestion, occasional sneezing. Does get up some phlegm.  He denies fever, sore throat, ear pain, nausea or vomiting. At times feels a little wheezy.  Denies chest pain, leg swelling.  No sick contacts. 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  Past Medical History  Diagnosis Date  . DDD (degenerative disc disease)   . Melanoma     sees Dr. Margo Aye.  back and chest  . GERD (gastroesophageal reflux disease)   . Alopecia   . ED (erectile dysfunction)   . Diverticulosis   . Renal stone    Review of systems as in subjective  Objective: Filed Vitals:   03/23/13 1153  BP: 130/80  Pulse: 68  Temp: 98 F (36.7 C)  Resp: 16    General appearance: alert, no distress, WD/WN HEENT: normocephalic, sclerae anicteric, TMs pearly, nares patent, no discharge or erythema, pharynx normal Oral cavity: MMM, no lesions Neck: supple, no lymphadenopathy, no thyromegaly, no masses Heart: RRR, normal S1, S2, no murmurs Lungs: CTA bilaterally, no wheezes, rhonchi, or rales Pulses: 2+ symmetric, upper and lower extremities, normal cap refill Extremities: No edema  Assessment: Encounter Diagnosis  Name Primary?  . Cough Yes    Plan: We discussed the differential for cough. At this time no obvious pneumonia, lung exam is clear, and based on environmental exposures, this could be allergen induced.  He will continue over-the-counter antihistamine for the next few weeks, he can still use Mucinex the next several days, increase water intake, and short-term will have him use a trial of albuterol inhaler.  Will treat presumptively for allergic bronchitis/reactive airway.  I gave him an inhaler and demonstrated  proper use today. He is to call back by Friday give me an update, however if worse in the meantime, call or return.

## 2013-03-23 NOTE — Addendum Note (Signed)
Addended by: Jac Canavan on: 03/23/2013 01:36 PM   Modules accepted: Orders

## 2013-04-14 ENCOUNTER — Other Ambulatory Visit: Payer: Self-pay | Admitting: Family Medicine

## 2013-04-16 NOTE — Telephone Encounter (Signed)
Is this okay to fill? 

## 2013-08-31 ENCOUNTER — Ambulatory Visit (INDEPENDENT_AMBULATORY_CARE_PROVIDER_SITE_OTHER): Payer: 59 | Admitting: Family Medicine

## 2013-08-31 DIAGNOSIS — R209 Unspecified disturbances of skin sensation: Secondary | ICD-10-CM

## 2013-08-31 DIAGNOSIS — R201 Hypoesthesia of skin: Secondary | ICD-10-CM

## 2013-08-31 DIAGNOSIS — IMO0002 Reserved for concepts with insufficient information to code with codable children: Secondary | ICD-10-CM

## 2013-08-31 DIAGNOSIS — T07XXXA Unspecified multiple injuries, initial encounter: Secondary | ICD-10-CM

## 2013-08-31 NOTE — Patient Instructions (Signed)
Treat the abrasions normally. Keep benign on the thumb and index anger sensations and if they don't improve over the next several weeks come back
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
## 2013-10-14 ENCOUNTER — Other Ambulatory Visit: Payer: Self-pay | Admitting: Family Medicine

## 2013-10-14 NOTE — Telephone Encounter (Signed)
Is this okay to refill? 

## 2013-11-30 ENCOUNTER — Other Ambulatory Visit: Payer: Self-pay | Admitting: Family Medicine

## 2013-12-01 NOTE — Telephone Encounter (Signed)
Okay to renew with 5 refills

## 2013-12-01 NOTE — Telephone Encounter (Signed)
IS THIS OKAY 

## 2014-01-05 ENCOUNTER — Ambulatory Visit (INDEPENDENT_AMBULATORY_CARE_PROVIDER_SITE_OTHER): Payer: 59 | Admitting: Family Medicine

## 2014-01-05 ENCOUNTER — Encounter: Payer: Self-pay | Admitting: Family Medicine

## 2014-01-05 VITALS — BP 120/80 | HR 80 | Wt 209.0 lb

## 2014-01-05 DIAGNOSIS — M7022 Olecranon bursitis, left elbow: Secondary | ICD-10-CM

## 2014-01-05 MED ORDER — AMOXICILLIN-POT CLAVULANATE 875-125 MG PO TABS: 1.0000 | ORAL_TABLET | Freq: Two times a day (BID) | ORAL | Status: DC

## 2014-01-05 NOTE — Patient Instructions (Signed)
Take the antibiotic and if it doesn't go away call me

## 2014-01-05 NOTE — Progress Notes (Signed)
   Subjective:    [name], male    DOB: [birth year], 58 y.o.   MRN: [id number]  HPI He is here for evaluation of left elbow pain and swelling for the last week. He does state that the swelling has diminished slightly. No history of injury.   Review of Systems     Objective:   Physical Exam Left olecranon bursa is warm, red and slightly tender. Full motion of the elbow.       Assessment & Plan:  Olecranon bursitis, left - Plan: amoxicillin-clavulanate (AUGMENTIN) 875-125 MG per tablet  I will initially treat with an antibiotic. If the lesion does not continue to shrink in size, I warned him that surgical intervention will be needed. He is comfortable with that. He will call if it does not continue to resolve.

## 2014-04-19 ENCOUNTER — Other Ambulatory Visit: Payer: Self-pay | Admitting: Family Medicine

## 2014-04-19 NOTE — Telephone Encounter (Signed)
Is this okay?

## 2014-05-24 ENCOUNTER — Ambulatory Visit (INDEPENDENT_AMBULATORY_CARE_PROVIDER_SITE_OTHER): Payer: 59 | Admitting: Family Medicine

## 2014-05-24 ENCOUNTER — Encounter: Payer: Self-pay | Admitting: Family Medicine

## 2014-05-24 VITALS — BP 116/80 | HR 92 | Wt 205.0 lb

## 2014-05-24 DIAGNOSIS — R7302 Impaired glucose tolerance (oral): Secondary | ICD-10-CM | POA: Insufficient documentation

## 2014-05-24 LAB — POCT GLYCOSYLATED HEMOGLOBIN (HGB A1C): Hemoglobin A1C: 5.8

## 2014-05-24 NOTE — Addendum Note (Signed)
Addended by: Randel Books on: 05/24/2014 12:27 PM   Modules accepted: Orders

## 2014-05-24 NOTE — Progress Notes (Signed)
   Subjective:    [name], male    DOB: [birth year], 59 y.o.   MRN: [id number]  HPI He is here for consult concerning recent biometric testing which did show elevated blood pressure and blood sugar. The blood sugar testing was done roughly one hour after he ate a high carbohydrate meal. Review of his record and occasional blood pressure readings.   Review of Systems     Objective:   Physical Exam Alert and in no distress. Blood pressure today is normal. Fasting blood sugar was 108. Hemoglobin A1c is 5.8.       Assessment & Plan:  Glucose intolerance (impaired glucose tolerance)  I explained that his blood pressure is entirely normal and no further intervention necessary. Discussed his slightly elevated hemoglobin A1c in regard to risk for diabetes. Recommend monitoring this. Also encouraged him to become more physically active.

## 2014-08-30 ENCOUNTER — Other Ambulatory Visit: Payer: Self-pay | Admitting: Family Medicine

## 2014-08-30 ENCOUNTER — Other Ambulatory Visit: Payer: Self-pay

## 2014-08-30 NOTE — Telephone Encounter (Signed)
Is this okay?

## 2014-08-30 NOTE — Telephone Encounter (Signed)
Okay to renew

## 2014-10-19 ENCOUNTER — Ambulatory Visit (INDEPENDENT_AMBULATORY_CARE_PROVIDER_SITE_OTHER): Payer: 59 | Admitting: Family Medicine

## 2014-10-19 ENCOUNTER — Encounter: Payer: Self-pay | Admitting: Family Medicine

## 2014-10-19 VITALS — BP 120/80 | HR 60 | Wt 191.0 lb

## 2014-10-19 DIAGNOSIS — N528 Other male erectile dysfunction: Secondary | ICD-10-CM

## 2014-10-19 DIAGNOSIS — G4726 Circadian rhythm sleep disorder, shift work type: Secondary | ICD-10-CM

## 2014-10-19 MED ORDER — ZOLPIDEM TARTRATE 10 MG PO TABS: 10.0000 mg | ORAL_TABLET | Freq: Every evening | ORAL | Status: DC | PRN

## 2014-10-19 MED ORDER — TADALAFIL 20 MG PO TABS: 20.0000 mg | ORAL_TABLET | Freq: Every day | ORAL | Status: DC | PRN

## 2014-10-19 NOTE — Progress Notes (Signed)
   Subjective:    [name], male    DOB: 1956-03-21, 59 y.o.   MRN: [id number]  HPI He recently had a job change and now he is working one week on and one-week off. The schedule necessitates him sometimes being awake at all hours of the evening interfering with his sleep for the next day or so. The weekends are usually less of a concern still potentially him being awake for long periods of time. He would also like a refill on his Cialis. He has given this in the past and finds it does work well.  Review of Systems     Objective:   Physical Exam Alert and in no distress otherwise not examined. Medication list was reviewed.       Assessment & Plan:  Sleep disorder, shift work - Plan: zolpidem (AMBIEN) 10 MG tablet  Other male erectile dysfunction - Plan: tadalafil (CIALIS) 20 MG tablet  I discussed the use of Ambien and possible side effects including insomnia. He will let me know how this works. We also discussed the possible use of other meds if needed.

## 2014-10-22 ENCOUNTER — Other Ambulatory Visit: Payer: Self-pay | Admitting: Family Medicine

## 2014-10-22 NOTE — Telephone Encounter (Signed)
Is this okay to refill? 

## 2014-11-22 ENCOUNTER — Encounter: Payer: Self-pay | Admitting: Family Medicine

## 2014-11-22 ENCOUNTER — Ambulatory Visit (INDEPENDENT_AMBULATORY_CARE_PROVIDER_SITE_OTHER): Payer: 59 | Admitting: Family Medicine

## 2014-11-22 ENCOUNTER — Ambulatory Visit
Admission: RE | Admit: 2014-11-22 | Discharge: 2014-11-22 | Disposition: A | Discharge status: Home / Self Care | Payer: 59 | Source: Ambulatory Visit | Attending: Family Medicine | Admitting: Family Medicine

## 2014-11-22 VITALS — BP 130/80 | HR 68 | Temp 97.5°F | Wt 189.6 lb

## 2014-11-22 DIAGNOSIS — R634 Abnormal weight loss: Secondary | ICD-10-CM

## 2014-11-22 DIAGNOSIS — G8929 Other chronic pain: Secondary | ICD-10-CM

## 2014-11-22 DIAGNOSIS — K219 Gastro-esophageal reflux disease without esophagitis: Secondary | ICD-10-CM

## 2014-11-22 DIAGNOSIS — M542 Cervicalgia: Secondary | ICD-10-CM

## 2014-11-22 DIAGNOSIS — M549 Dorsalgia, unspecified: Secondary | ICD-10-CM | POA: Diagnosis not present

## 2014-11-22 DIAGNOSIS — Z23 Encounter for immunization: Secondary | ICD-10-CM

## 2014-11-22 LAB — COMPREHENSIVE METABOLIC PANEL
ALT: 21 U/L (ref 9–46)
AST: 28 U/L (ref 10–35)
Albumin: 4.2 g/dL (ref 3.6–5.1)
Alkaline Phosphatase: 71 U/L (ref 40–115)
BUN: 17 mg/dL (ref 7–25)
CO2: 22 mmol/L (ref 20–31)
Calcium: 8.9 mg/dL (ref 8.6–10.3)
Chloride: 105 mmol/L (ref 98–110)
Creat: 1.02 mg/dL (ref 0.70–1.25)
Glucose, Bld: 94 mg/dL (ref 65–99)
Potassium: 4.5 mmol/L (ref 3.5–5.3)
Sodium: 136 mmol/L (ref 135–146)
Total Bilirubin: 0.3 mg/dL (ref 0.2–1.2)
Total Protein: 6.5 g/dL (ref 6.1–8.1)

## 2014-11-22 LAB — CBC WITH DIFFERENTIAL/PLATELET
Basophils Absolute: 0.1 10*3/uL (ref 0.0–0.1)
Basophils Relative: 1 % (ref 0–1)
Eosinophils Absolute: 0.1 10*3/uL (ref 0.0–0.7)
Eosinophils Relative: 2 % (ref 0–5)
HCT: 38.1 % — ABNORMAL LOW (ref 39.0–52.0)
Hemoglobin: 13 g/dL (ref 13.0–17.0)
Lymphocytes Relative: 22 % (ref 12–46)
Lymphs Abs: 1.3 10*3/uL (ref 0.7–4.0)
MCH: 31.2 pg (ref 26.0–34.0)
MCHC: 34.1 g/dL (ref 30.0–36.0)
MCV: 91.4 fL (ref 78.0–100.0)
MPV: 10.3 fL (ref 8.6–12.4)
Monocytes Absolute: 0.9 10*3/uL (ref 0.1–1.0)
Monocytes Relative: 14 % — ABNORMAL HIGH (ref 3–12)
Neutro Abs: 3.7 10*3/uL (ref 1.7–7.7)
Neutrophils Relative %: 61 % (ref 43–77)
Platelets: 328 10*3/uL (ref 150–400)
RBC: 4.17 MIL/uL — ABNORMAL LOW (ref 4.22–5.81)
RDW: 13.5 % (ref 11.5–15.5)
WBC: 6.1 10*3/uL (ref 4.0–10.5)

## 2014-11-22 MED ORDER — TRAMADOL HCL 50 MG PO TABS: 50.0000 mg | ORAL_TABLET | Freq: Three times a day (TID) | ORAL | Status: DC | PRN

## 2014-11-22 NOTE — Progress Notes (Signed)
Subjective:    [name], male    DOB: March 19, 1956, 59 y.o.   MRN: [id number]  HPI He is here for a 2 week history of bilateral neck pain, non-radiating and worse with movement. He has several other complaints today as well. He states his neck pain started after he was doing some yard work that involved a lot of digging in the ground and lifting. He reports the pain is worse with certain movements. He does not report any numbness tingling or weakness to the upper extremities. Has been taking SOMA, tylenol, Celebrex, heat therapy, and a home traction device with minimal some pain relief. He has also continued to do yard work and states he trimmed some hedges yesterday and he thinks this may have flared up his neck pain even more.  Denies fever, chills, fatigue, GI or GU issues. He does report about a 20 pound unintentional weight loss over past 9 months or so. States he has a new job, increased stress and has been eating "poorly". He reports having good energy level and being very active. States he has a "good" appetite but also has some nausea and stomach feeling queasy after eating. This has been ongoing for several months, he cannot pinpoint date it began. He continues to take his reflux medication and states he thinks this is helping.  He reports a having chronic back pain and does not have anything out of his normal pain today in his back.  He also reports taking Ambien for sleep and states he is only able to get 5 hours of sleep using this medication. Would like to discuss sleep medications further.   Reviewed allergies, medications, past medical, social and family history.    Review of Systems Pertinent positives and negatives in the history of present illness, systems otherwise negative.    Objective:   Physical Exam  Constitutional: He is oriented to person, place, and time. He appears well-developed and well-nourished. No distress.  HENT:  Head: Normocephalic and  atraumatic.  Neck: Normal range of motion. Neck supple. No muscular tenderness present. No edema, no erythema and normal range of motion present.  Pulmonary/Chest: Effort normal and breath sounds normal.  Lymphadenopathy:    He has no cervical adenopathy.    He has no axillary adenopathy.  Neurological: He is alert and oriented to person, place, and time. He has normal strength. He displays no atrophy and no tremor. No sensory deficit.  Skin: Skin is warm, dry and intact. No rash noted. No pallor.  Psychiatric: He has a normal mood and affect. His speech is normal and behavior is normal. Judgment and thought content normal. Cognition and memory are normal.          Assessment & Plan:  Neck pain, bilateral posterior - Plan: DG Cervical Spine Complete  Loss of weight - Plan: CBC with Differential/Platelet, Comprehensive metabolic panel  Need for prophylactic vaccination and inoculation against influenza - Plan: CANCELED: Flu Vaccine QUAD 36+ mos PF IM (Fluarix & Fluzone Quad PF)  Chronic back pain  Gastroesophageal reflux disease, esophagitis presence not specified  Suspect that his neck pain is due to musculoskeletal issues but will get an x-ray of his neck.  He will try tramadol for his neck pain in addition to his usual pain medication for chronic pain. Encouraged him to also continue heating for 20 minutes and to add some stretches for his neck, which I demonstrated in the office. Advised him to lay off the  traction temporarily. Since he has a 20 pound unexplained weight loss over the past few months, we will pursue an explanation for this and start by ordering some blood work. He is also due for his colonoscopy next year and discussed that it might be good to go ahead and schedule this. We also discussed the stress he is under with his job even though it is a good stress, it is still stress. He states he has a good support network and has a happy home life with his wife. Recommend that  he continue taking his PPI for reflux.   Advised him that no adjustment would be made today to his sleep medications and that he should follow up for this with Dr. Redmond School. He will not get a flu shot here since he gets this for free at his job.

## 2014-11-22 NOTE — Patient Instructions (Addendum)
We will see what your x-ray and lab results look like. Try applying moist heat for 20 minutes 2-3 times per day followed by slow controlled stretching of your neck. Take the Tramadol with food as prescribed. Schedule your complete physical as soon as convenient. You are also due for your colonoscopy in the next few months.  You received your flu shot today.

## 2014-11-23 NOTE — Addendum Note (Signed)
Addended by: Minette Headland A on: 11/23/2014 04:12 PM   Modules accepted: Orders

## 2014-11-30 ENCOUNTER — Telehealth: Payer: Self-pay | Admitting: Family Medicine

## 2014-11-30 ENCOUNTER — Other Ambulatory Visit: Payer: Self-pay | Admitting: Family Medicine

## 2014-11-30 ENCOUNTER — Telehealth: Payer: Self-pay

## 2014-11-30 ENCOUNTER — Encounter: Payer: Self-pay | Admitting: Physician Assistant

## 2014-11-30 MED ORDER — TRAMADOL HCL 50 MG PO TABS: 50.0000 mg | ORAL_TABLET | Freq: Three times a day (TID) | ORAL | Status: DC | PRN

## 2014-11-30 NOTE — Telephone Encounter (Signed)
Pt is wanting to take lyrica as he googled that and thinks it would benefit to his neck pain

## 2014-11-30 NOTE — Telephone Encounter (Signed)
Please let him know that I discussed him with Dr. Redmond School and he should definitely go to Physical therapy, we will refer today. Let him know we will call in pain medicine Voltaren, take it with food. He can start using traction if he wants.  He also needs to schedule his colonoscopy since he has this weight loss issue.

## 2014-11-30 NOTE — Telephone Encounter (Signed)
Pt is going to try [facility] at [facility] first for 3-4 weeks and if not better then we can refer to neurosurgery for second opinion

## 2014-11-30 NOTE — Telephone Encounter (Signed)
Needs referral to Dr. Tomasita Crumble office on [address]. Phone# [phone]. Needs the referral for his neck problems, he saw Vickie for this on 08/29. He needs the recent neck x-ray from [facility] imaging and any notes from his visit with [place] on the 29th, and a demographics sheet sent to them as well.

## 2014-11-30 NOTE — Telephone Encounter (Signed)
Pt was agreeable with sticking with tramadol and doing PT first and then if PT is not working try neurosurgery. Called out med to [facility] and [facility]

## 2014-11-30 NOTE — Telephone Encounter (Signed)
Pt states that Tramadol is not working well . Can he get more pain med. Maybe Prednisone or something? Also would like to take the physical therapy referral now as well. Can he try his traction machine that he has at home? This has worked in the past for him.

## 2014-11-30 NOTE — Telephone Encounter (Signed)
Also, I see he has gastric upset with NSAIDS in past. Please clarify this with him before I will prescribe Voltaren.

## 2014-11-30 NOTE — Telephone Encounter (Signed)
I consulted with Dr. Redmond School, he and I are in agreement that Lyrica is not indicated for the type of pain he is having. Lyrica is for nerve root or "neuropathic" pain and unless his symptoms have changed, he did not appear have this type of pain. If he has numbness, tingling or weakness then we recommend that he comes in and sees Dr. Redmond School for this. Otherwise, we will call in more Tramadol and he can try physical therapy. We are willing to refer him back to Dr. Ellene Route, neuro if he wants after trying PT

## 2014-11-30 NOTE — Telephone Encounter (Signed)
Please take care of this. Thanks  

## 2014-11-30 NOTE — Telephone Encounter (Signed)
I will refill his tramadol. Tell him we will do this instead of adding another NSAID. He can keep taking his Celebrex.

## 2014-11-30 NOTE — Telephone Encounter (Signed)
i have faxed over referral info to [facility] @ [phone] @ [place] [address] and put on there to get him in as soon as possible. Pt wants to know can he take the voltaren and tramadol together or can he take voltaren and celexbrex together. He just has an intolerance to the Daypro in his allergies. Pt states the tramadol helps some if he take 3 a day. Pt is going to call today about his colonoscopy. Please advise.

## 2014-12-01 NOTE — Telephone Encounter (Signed)
Pt has an appt with [facility] on September 12th @ 11:00am with Dr. Gaylan Gerold. Address: [address] [place], [place]. Phone # [phone]

## 2014-12-01 NOTE — Telephone Encounter (Signed)
Pt has an appt with [facility] on September 12th @ 11:00am with Dr. Gaylan Gerold. Address: [address] [place], [place]. Phone # [phone]

## 2014-12-09 ENCOUNTER — Telehealth: Payer: Self-pay | Admitting: Family Medicine

## 2014-12-09 NOTE — Telephone Encounter (Signed)
Have him come in for an appointment. Explain that I him reluctant to put him on long-term use of codeine

## 2014-12-09 NOTE — Telephone Encounter (Signed)
Pt called and was seen for neck pain and was prescibed tramadol, he was wanting to see if he could get something else that the tramadol wasn't helping as good, needs something to help control his pain better, is in pt for the neck pain, pt can be reached at [phone], pt uses [facility] [address]

## 2014-12-10 ENCOUNTER — Encounter: Payer: Self-pay | Admitting: Family Medicine

## 2014-12-10 ENCOUNTER — Ambulatory Visit (INDEPENDENT_AMBULATORY_CARE_PROVIDER_SITE_OTHER): Payer: 59 | Admitting: Family Medicine

## 2014-12-10 VITALS — BP 124/80 | HR 74 | Ht 77.0 in | Wt 190.0 lb

## 2014-12-10 DIAGNOSIS — M5481 Occipital neuralgia: Secondary | ICD-10-CM

## 2014-12-10 MED ORDER — TRAMADOL HCL 50 MG PO TABS: 50.0000 mg | ORAL_TABLET | Freq: Three times a day (TID) | ORAL | Status: DC | PRN

## 2014-12-10 NOTE — Progress Notes (Signed)
   Subjective:    [name], male    DOB: 1955-07-03, 59 y.o.   MRN: [id number]  HPI He has a previous history of difficulty with intermittent chronic neck pain. Over the last several weeks he has noted increased difficulty with neck pain usually at the base of the skull that does radiate to the forehead and occasionally distally. He has been taking Celebrex, tramadol, heat and stretching as well as going to physical therapy. He has received very little relief of his symptoms. No numbness, tingling or weakness in his arms.   Review of Systems     Objective:   Physical Exam Alert and in no distress. Some is comfort is noted at the [facility] of the skull. Normal motor sensory DTRs.       Assessment & Plan:  Bilateral occipital neuralgia - Plan: traMADol (ULTRAM) 50 MG tablet, Ambulatory referral to Neurology Since he is really not responded to other therapies, I think possible injections might help.To continue with heat and stretching as well as tramadol until he can be seen.

## 2014-12-17 ENCOUNTER — Ambulatory Visit (INDEPENDENT_AMBULATORY_CARE_PROVIDER_SITE_OTHER): Payer: 59 | Admitting: Physician Assistant

## 2014-12-17 ENCOUNTER — Encounter: Payer: Self-pay | Admitting: Physician Assistant

## 2014-12-17 VITALS — BP 100/70 | HR 76 | Ht 77.0 in | Wt 187.8 lb

## 2014-12-17 DIAGNOSIS — R630 Anorexia: Secondary | ICD-10-CM | POA: Diagnosis not present

## 2014-12-17 DIAGNOSIS — R634 Abnormal weight loss: Secondary | ICD-10-CM

## 2014-12-17 DIAGNOSIS — R109 Unspecified abdominal pain: Secondary | ICD-10-CM

## 2014-12-17 MED ORDER — NA SULFATE-K SULFATE-MG SULF 17.5-3.13-1.6 GM/177ML PO SOLN: 1.0000 | ORAL | Status: DC

## 2014-12-17 NOTE — Patient Instructions (Signed)
You have been scheduled for a CT scan of the abdomen and pelvis at [facility] ([address]---this is in the same building as [facility]).   You are scheduled on 12/23/14 at 11 am. You should arrive 15 minutes prior to your appointment time for registration. Please follow the written instructions below on the day of your exam:  WARNING: IF YOU ARE ALLERGIC TO IODINE/X-RAY DYE, PLEASE NOTIFY RADIOLOGY IMMEDIATELY AT [phone]! YOU WILL BE GIVEN A 13 HOUR PREMEDICATION PREP.  1) Do not eat or drink anything after 7 am (4 hours prior to your test) 2) You have been given 2 bottles of oral contrast to drink. The solution may taste better if refrigerated, but do NOT add ice or any other liquid to this solution. Shake well before drinking.    Drink 1 bottle of contrast @ 9 am (2 hours prior to your exam)  Drink 1 bottle of contrast @ 10 am (1 hour prior to your exam)  You may take any medications as prescribed with a small amount of water except for the following: Metformin, Glucophage, Glucovance, Avandamet, Riomet, Fortamet, Actoplus Met, Janumet, Glumetza or Metaglip. The above medications must be held the day of the exam AND 48 hours after the exam.  The purpose of you drinking the oral contrast is to aid in the visualization of your intestinal tract. The contrast solution may cause some diarrhea. Before your exam is started, you will be given a small amount of fluid to drink. Depending on your individual set of symptoms, you may also receive an intravenous injection of x-ray contrast/dye. Plan on being at [facility] for 30 minutes or long, depending on the type of exam you are having performed.  This test typically takes 30-45 minutes to complete.  If you have any questions regarding your exam or if you need to reschedule, you may call the CT department at [phone] between the hours of 8:00 am and 5:00 pm,  Monday-Friday.  ________________________________________________________________________  Joseph [name] have been scheduled for a colonoscopy. Please follow written instructions given to you at your visit today.  Please pick up your prep supplies at the pharmacy within the next 1-3 days. If you use inhalers (even only as needed), please bring them with you on the day of your procedure. Your physician has requested that you go to www.startemmi.com and enter the access code given to you at your visit today. This web site gives a general overview about your procedure. However, you should still follow specific instructions given to you by our office regarding your preparation for the procedure.

## 2014-12-17 NOTE — Progress Notes (Signed)
[name], male   DOB: [birth year], 59 y.o.   MRN: [id number]   Subjective:    [name], male    DOB: 04-08-1955, 59 y.o.   MRN: [id number]  HPI  Joseph [name] is a pleasant 59 year old white male referred today by Joseph [name] for evaluation of recent weight loss of 20 pounds over the past 9 months. Patient known to Joseph [name] from remote colonoscopy done in 2007 which was normal with the exception of a single diverticulum. Patient has had problems with chronic back pain and is currently dealing with cervical disc pain. 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 He says that has altered his sleep pattern and eating quite a bit. He says when he is working is very busy frequently skips meals-has been more active on his week soft. He hasn't changed what he has been eating significantly however. His appetite is okay. 6-8 weeks ago he had some sharp pains in his left abdomen which were brief but recurred for about a week particularly noticeable with and after bowel movements. He hasn't had any pain in the past couple of weeks. He denies any diarrhea melena or hematochezia occasionally sees a small amount of bright red blood on the tissue. No dysphagia or odynophagia regular heartburn or indigestion. He has on occasion had some cramping in his upper abdomen after eating. Recent labs done 11/22/2014 CBC normal and CMET within normal limits.  Review of Systems Pertinent positive and negative review of systems were noted in the above HPI section.  All other review of systems was otherwise negative.  Outpatient Encounter Prescriptions as of 12/17/2014  Medication Sig  . carisoprodol (SOMA) 350 MG tablet TAKE 1 TABLET 3 TIMES A DAY AS NEEDED FOR MUSCLE SPASM  . celecoxib (CELEBREX) 200 MG capsule TAKE 1 CAPSULE BY MOUTH TWICE DAILY  . cholecalciferol (VITAMIN D) 1000 UNITS tablet  Take 1,000 Units by mouth daily.  . ferrous sulfate 325 (65 FE) MG tablet Take 325 mg by mouth daily with breakfast.    . finasteride (PROPECIA) 1 MG tablet Take 1 mg by mouth daily. 1/4 TABLET DAILY  . fish oil-omega-3 fatty acids 1000 MG capsule Take 2 g by mouth daily.    Marland Kitchen glucosamine-chondroitin 500-400 MG tablet Take 1 tablet by mouth 3 (three) times daily.    . Multiple Vitamins-Minerals (MULTIVITAMIN WITH MINERALS) tablet Take 1 tablet by mouth daily.    . PredniSONE 5 MG TBEC Take 1 tablet by mouth daily.  Marland Kitchen selenium 50 MCG TABS Take 50 mcg by mouth daily.    . tadalafil (CIALIS) 20 MG tablet Take 1 tablet (20 mg total) by mouth daily as needed for erectile dysfunction.  . traMADol (ULTRAM) 50 MG tablet Take 1 tablet (50 mg total) by mouth every 8 (eight) hours as needed. (Patient taking differently: Take 1-2 tablets by mouth every 6-8 hours)  . vitamin B-12 (CYANOCOBALAMIN) 1000 MCG tablet Take 1,000 mcg by mouth daily.  . vitamin C (ASCORBIC ACID) 500 MG tablet Take 500 mg by mouth daily as needed.   . vitamin E 100 UNIT capsule Take 100 Units by mouth daily.  . Na Sulfate-K Sulfate-Mg Sulf (SUPREP BOWEL PREP) SOLN Take 1 kit by mouth as directed.  . zolpidem (AMBIEN) 10 MG tablet Take 1 tablet (10 mg total) by mouth at bedtime  as needed for sleep.   No facility-administered encounter medications on file as of 12/17/2014.   Allergies  Allergen Reactions  . Daypro [Oxaprozin]     GASTRIC PROBLEMS  . Methadone Itching   Patient Active Problem List   Diagnosis Date Noted  . Glucose intolerance (impaired glucose tolerance) 05/24/2014  . Chronic back pain 04/26/2011  . History of kidney stones 04/26/2011  . ED (erectile dysfunction) 04/26/2011  . GERD (gastroesophageal reflux disease) 04/26/2011   Social History   Social History  . Marital Status: Married    Spouse Name: N/A  . Number of Children: N/A  . Years of Education: N/A   Occupational History  . Not on file.    Social History Main Topics  . Smoking status: Former Smoker -- 1.00 packs/day    Quit date: 04/25/1994  . Smokeless tobacco: Never Used  . Alcohol Use: 2.4 oz/week    4 Cans of beer per week     Comment: less than 1 drink daily  . Drug Use: No  . Sexual Activity: Yes   Other Topics Concern  . Not on file   Social History Narrative    Joseph [name] family history includes Bladder Cancer in his father; Celiac disease in his mother; Multiple myeloma in his father; Transient ischemic attack in his father. There is no history of Colon cancer, Esophageal cancer, Liver disease, Kidney disease, or Colon polyps.      Objective:    Filed Vitals:   12/17/14 1028  BP: 100/70  Pulse: 76    Physical Exam   well-developed white male in no acute distress, pleasant blood pressure 100/70 pulse 76 height 6 foot 5 weight 187, BMI 22.2. HEENT ;nontraumatic normocephalic EOMI PERRLA sclera anicteric, Cardiovascular; regular rate and rhythm with S1-S2 no murmur or gallop, Pulmonary; clear bilaterally, Abdomen ;and basically nontender there is no palpable mass or hepatosplenomegaly bowel sounds are present, Rectal; exam not done, Extremities ;no clubbing cyanosis or edema skin warm and dry, Neuropsych; mood and affect appropriate      Assessment & Plan:   #1 59 yo male with unintentional 20 pound weight loss over 9 months Possible this is due to change in work schedule ,but with significant weight loss need to r/o occult malignancy  #2 recent intermittent LLQ pain #3 chronic back pain  Plan; Ct abd/pelvis with contrast Schedule for colonoscopy with Joseph [name]- procedure discussed in detail with pt and he is agreeable to proceed. Further plans pending results of above   Joseph Ferguson PA-C 12/17/2014   Cc: Joseph Lung, MD

## 2014-12-21 ENCOUNTER — Other Ambulatory Visit: Payer: Self-pay | Admitting: Family Medicine

## 2014-12-22 NOTE — Telephone Encounter (Signed)
Called in med to pharmacy  

## 2014-12-22 NOTE — Telephone Encounter (Signed)
Okay to renew

## 2014-12-22 NOTE — Telephone Encounter (Signed)
Is this okay to refill? 

## 2014-12-23 ENCOUNTER — Ambulatory Visit (INDEPENDENT_AMBULATORY_CARE_PROVIDER_SITE_OTHER)
Admission: RE | Admit: 2014-12-23 | Discharge: 2014-12-23 | Disposition: A | Discharge status: Home / Self Care | Payer: 59 | Source: Ambulatory Visit | Attending: Physician Assistant | Admitting: Physician Assistant

## 2014-12-23 DIAGNOSIS — R634 Abnormal weight loss: Secondary | ICD-10-CM | POA: Diagnosis not present

## 2014-12-23 DIAGNOSIS — R109 Unspecified abdominal pain: Secondary | ICD-10-CM

## 2014-12-23 DIAGNOSIS — R630 Anorexia: Secondary | ICD-10-CM

## 2014-12-23 MED ORDER — IOHEXOL 300 MG/ML  SOLN
100.0000 mL | Freq: Once | INTRAMUSCULAR | Status: AC | PRN
  Administered 2014-12-23: 100 mL via INTRAVENOUS

## 2014-12-31 ENCOUNTER — Other Ambulatory Visit: Payer: Self-pay | Admitting: Family Medicine

## 2014-12-31 NOTE — Telephone Encounter (Signed)
DR.LALONDE IS ON VACATION SO I WENT AHEAD AND CALLED IN

## 2015-01-03 NOTE — Progress Notes (Signed)
Reviewed and agree with management. Robert D. Kaplan, M.D., FACG  

## 2015-01-05 ENCOUNTER — Other Ambulatory Visit: Payer: Self-pay | Admitting: Family Medicine

## 2015-01-06 ENCOUNTER — Encounter: Payer: 59 | Admitting: Gastroenterology

## 2015-01-06 NOTE — Telephone Encounter (Signed)
Is this okay to call in? 

## 2015-01-06 NOTE — Telephone Encounter (Signed)
Last OV note with Dr. Redmond School said something about this pain medication buying him time til he can be seen.  Who is he suppose to be seeing, physical therapy? Specialist?    Dr. Redmond School is out until Monday, and I got the refill request, so trying to determine if refill appropriate.

## 2015-01-06 NOTE — Telephone Encounter (Signed)
He has an appt with neuro the 27th. Should I refill it

## 2015-01-07 NOTE — Telephone Encounter (Signed)
Call out #[phone] 

## 2015-01-10 NOTE — Telephone Encounter (Signed)
Recv'd another RF request for Tramadol

## 2015-01-11 NOTE — Telephone Encounter (Signed)
Called in.

## 2015-01-17 ENCOUNTER — Ambulatory Visit (INDEPENDENT_AMBULATORY_CARE_PROVIDER_SITE_OTHER): Payer: 59 | Admitting: Family Medicine

## 2015-01-17 ENCOUNTER — Encounter: Payer: Self-pay | Admitting: Family Medicine

## 2015-01-17 VITALS — BP 124/88 | HR 83 | Ht 77.0 in | Wt 189.0 lb

## 2015-01-17 DIAGNOSIS — I868 Varicose veins of other specified sites: Secondary | ICD-10-CM | POA: Diagnosis not present

## 2015-01-17 DIAGNOSIS — Z87442 Personal history of urinary calculi: Secondary | ICD-10-CM

## 2015-01-17 DIAGNOSIS — Z0000 Encounter for general adult medical examination without abnormal findings: Secondary | ICD-10-CM | POA: Diagnosis not present

## 2015-01-17 DIAGNOSIS — M549 Dorsalgia, unspecified: Secondary | ICD-10-CM

## 2015-01-17 DIAGNOSIS — G8929 Other chronic pain: Secondary | ICD-10-CM

## 2015-01-17 DIAGNOSIS — Z23 Encounter for immunization: Secondary | ICD-10-CM | POA: Diagnosis not present

## 2015-01-17 DIAGNOSIS — Z85820 Personal history of malignant melanoma of skin: Secondary | ICD-10-CM | POA: Diagnosis not present

## 2015-01-17 DIAGNOSIS — N5201 Erectile dysfunction due to arterial insufficiency: Secondary | ICD-10-CM | POA: Diagnosis not present

## 2015-01-17 DIAGNOSIS — K219 Gastro-esophageal reflux disease without esophagitis: Secondary | ICD-10-CM

## 2015-01-17 DIAGNOSIS — I491 Atrial premature depolarization: Secondary | ICD-10-CM | POA: Insufficient documentation

## 2015-01-17 DIAGNOSIS — Z1159 Encounter for screening for other viral diseases: Secondary | ICD-10-CM | POA: Diagnosis not present

## 2015-01-17 DIAGNOSIS — I8390 Asymptomatic varicose veins of unspecified lower extremity: Secondary | ICD-10-CM

## 2015-01-17 DIAGNOSIS — R7302 Impaired glucose tolerance (oral): Secondary | ICD-10-CM | POA: Diagnosis not present

## 2015-01-17 DIAGNOSIS — I8393 Asymptomatic varicose veins of bilateral lower extremities: Secondary | ICD-10-CM | POA: Insufficient documentation

## 2015-01-17 LAB — LIPID PANEL
Cholesterol: 173 mg/dL (ref 125–200)
HDL: 38 mg/dL — ABNORMAL LOW (ref >=40)
LDL Cholesterol: 115 mg/dL (ref <130)
Total CHOL/HDL Ratio: 4.6 Ratio (ref <=5.0)
Triglycerides: 101 mg/dL (ref <150)
VLDL: 20 mg/dL (ref <30)

## 2015-01-17 LAB — POCT URINALYSIS DIPSTICK
Bilirubin, UA: NEGATIVE
Blood, UA: NEGATIVE
Glucose, UA: NEGATIVE
Ketones, UA: NEGATIVE
Leukocytes, UA: NEGATIVE
Nitrite, UA: NEGATIVE
Protein, UA: NEGATIVE
Spec Grav, UA: 1.02
Urobilinogen, UA: NEGATIVE
pH, UA: 6

## 2015-01-17 NOTE — Progress Notes (Signed)
Subjective:    [name], male    DOB: May 19, 1955, 59 y.o.   MRN: [id number]  HPI He is here for a complete examination. He continues in physical therapy to help with his neck pain. He also has an underlying history of chronic back pain which has been giving him very little difficulty. He also has a history of varicose veins and has been wearing support hose for several years concerning this. He sees dermatology regularly for evaluation of a previous history of melanoma. He does have reflux disease but has this under good control. He's had no nausea, vomiting, abdominal pain. He does have underlying ED and gets good results with his present medication. Does hav e a previous history of kidney stones and apparently does have a 2 mm stone. His work and home life are going well. Family and social history as well as health maintenance and immunizations were reviewed.  he is scheduled for colonoscopy to help further evaluate for recent weight loss.  Review of Systems  All other systems reviewed and are negative.      Objective:   Physical Exam BP 124/88 mmHg  Pulse 83  Ht 6\' 5"  (1.956 m)  Wt 189 lb (85.73 kg)  BMI 22.41 kg/m2  SpO2 97%  General Appearance:    Alert, cooperative, no distress, appears stated age  Head:    Normocephalic, without obvious abnormality, atraumatic  Eyes:    PERRL, conjunctiva/corneas clear, EOM's intact, fundi    benign  Ears:    Normal TM's and external ear canals  Nose:   Nares normal, mucosa normal, no drainage or sinus   tenderness  Throat:   Lips, mucosa, and tongue normal; teeth and gums normal  Neck:   Supple, no lymphadenopathy;  thyroid:  no   enlargement/tenderness/nodules; no carotid   bruit or JVD  Back:    Spine nontender, no curvature, ROM normal, no CVA     tenderness  Lungs:     Clear to auscultation bilaterally without wheezes, rales or     ronchi; respirations unlabored  Chest Wall:    No tenderness or deformity   Heart:     , irregular rhythm with pauses were noted S1 and S2 normal, no murmur, rub   or gallop  Breast Exam:    No chest wall tenderness, masses or gynecomastia  Abdomen:     Soft, non-tender, nondistended, normoactive bowel sounds,    no masses, no hepatosplenomegaly        Extremities:   No clubbing, cyanosis or edema  Pulses:   2+ and symmetric all extremities  Skin:   Skin color, texture, turgor normal, no rashes or lesions  Lymph nodes:   Cervical, supraclavicular, and axillary nodes normal  Neurologic:   CNII-XII intact, normal strength, sensation and gait; reflexes 2+ and symmetric throughout          Psych:   Normal mood, affect, hygiene and grooming.    EKG shows PACs      Assessment & Plan:  Routine general medical examination at a health care facility - Plan: POCT Urinalysis Dipstick, Lipid panel  Varicose veins  Gastroesophageal reflux disease without esophagitis  Glucose intolerance (impaired glucose tolerance)  Erectile dysfunction due to arterial insufficiency  Chronic back pain  History of kidney stones  Need for Zostavax administration - Plan: Varicella-zoster vaccine subcutaneous  Need for prophylactic vaccination and inoculation against influenza - Plan: Flu Vaccine QUAD 36+ mos IM  Need for hepatitis  C screening test - Plan: Hepatitis C Antibody  Premature atrial contractions - Plan: EKG 12-Lead  History of melanoma  he will continue to be followed by dermatology. Encouraged him to continue with physical therapy to help with his neck and his back. Continue on present medications. I explained that the EKG changes are of no great concern and did get a copy to take with him to the colonoscopy. Encouraged him to remain physically active. Also discussed possibility of retirement which she is considering in the next several years.

## 2015-01-18 ENCOUNTER — Ambulatory Visit (AMBULATORY_SURGERY_CENTER): Payer: 59 | Admitting: Gastroenterology

## 2015-01-18 ENCOUNTER — Encounter: Payer: Self-pay | Admitting: Gastroenterology

## 2015-01-18 VITALS — BP 148/98 | HR 78 | Temp 96.7°F | Resp 15 | Ht 77.0 in | Wt 187.0 lb

## 2015-01-18 DIAGNOSIS — D129 Benign neoplasm of anus and anal canal: Secondary | ICD-10-CM

## 2015-01-18 DIAGNOSIS — D128 Benign neoplasm of rectum: Secondary | ICD-10-CM

## 2015-01-18 DIAGNOSIS — R634 Abnormal weight loss: Secondary | ICD-10-CM | POA: Diagnosis not present

## 2015-01-18 DIAGNOSIS — K621 Rectal polyp: Secondary | ICD-10-CM

## 2015-01-18 DIAGNOSIS — R1032 Left lower quadrant pain: Secondary | ICD-10-CM

## 2015-01-18 LAB — HEPATITIS C ANTIBODY: HCV Ab: NEGATIVE

## 2015-01-18 MED ORDER — SODIUM CHLORIDE 0.9 % IV SOLN: 500.0000 mL | INTRAVENOUS | Status: DC

## 2015-01-18 NOTE — Progress Notes (Signed)
Called to room to assist during endoscopic procedure.  Patient ID and intended procedure confirmed with present staff. Received instructions for my participation in the procedure from the performing physician.  

## 2015-01-18 NOTE — Progress Notes (Signed)
Patient awakening,vss,report to rn 

## 2015-01-18 NOTE — Patient Instructions (Signed)
YOU HAD AN ENDOSCOPIC PROCEDURE TODAY AT THE [facility]:   Refer to the procedure report that was given to you for any specific questions about what was found during the examination.  If the procedure report does not answer your questions, please call your gastroenterologist to clarify.  If you requested that your care partner not be given the details of your procedure findings, then the procedure report has been included in a sealed envelope for you to review at your convenience later.  YOU SHOULD EXPECT: Some feelings of bloating in the abdomen. Passage of more gas than usual.  Walking can help get rid of the air that was put into your GI tract during the procedure and reduce the bloating. If you had a lower endoscopy (such as a colonoscopy or flexible sigmoidoscopy) you may notice spotting of blood in your stool or on the toilet paper. If you underwent a bowel prep for your procedure, you may not have a normal bowel movement for a few days.  Please Note:  You might notice some irritation and congestion in your nose or some drainage.  This is from the oxygen used during your procedure.  There is no need for concern and it should clear up in a day or so.  SYMPTOMS TO REPORT IMMEDIATELY:   Following lower endoscopy (colonoscopy or flexible sigmoidoscopy):  Excessive amounts of blood in the stool  Significant tenderness or worsening of abdominal pains  Swelling of the abdomen that is new, acute  Fever of 100F or higher   For urgent or emergent issues, a gastroenterologist can be reached at any hour by calling [phone].   DIET: Your first meal following the procedure should be a small meal and then it is ok to progress to your normal diet. Heavy or fried foods are harder to digest and may make you feel nauseous or bloated.  Likewise, meals heavy in dairy and vegetables can increase bloating.  Drink plenty of fluids but you should avoid alcoholic beverages for 24  hours.  ACTIVITY:  You should plan to take it easy for the rest of today and you should NOT DRIVE or use heavy machinery until tomorrow (because of the sedation medicines used during the test).    FOLLOW UP: Our staff will call the number listed on your records the next business day following your procedure to check on you and address any questions or concerns that you may have regarding the information given to you following your procedure. If we do not reach you, we will leave a message.  However, if you are feeling well and you are not experiencing any problems, there is no need to return our call.  We will assume that you have returned to your regular daily activities without incident.  If any biopsies were taken you will be contacted by phone or by letter within the next 1-3 weeks.  Please call us at [phone] if you have not heard about the biopsies in 3 weeks.    SIGNATURES/CONFIDENTIALITY: You and/or your care partner have signed paperwork which will be entered into your electronic medical record.  These signatures attest to the fact that that the information above on your After Visit Summary has been reviewed and is understood.  Full responsibility of the confidentiality of this discharge information lies with you and/or your care-partner. 

## 2015-01-18 NOTE — Op Note (Signed)
[place]  [address] [place], [address]   COLONOSCOPY PROCEDURE REPORT  PATIENT: [name], Joseph  MR#: [id number] BIRTHDATE: July 15, 1955 , 81  yrs. old GENDER: male ENDOSCOPIST: Ladene Artist, MD, [facility] REFERRED LK:JZPH Redmond School, M.D. PROCEDURE DATE:  01/18/2015 PROCEDURE:   Colonoscopy, diagnostic and Colonoscopy with biopsy First Screening Colonoscopy - Avg.  risk and is 50 yrs.  old or older - No.  Prior Negative Screening - Now for repeat screening. N/A  History of Adenoma - Now for follow-up colonoscopy & has been > or = to 3 yrs.  N/A  Polyps removed today? Yes ASA CLASS:   Class II INDICATIONS:  Weight loss, LLQ pain. MEDICATIONS: Monitored anesthesia care and Propofol 250 mg IV DESCRIPTION OF PROCEDURE:   After the risks benefits and alternatives of the procedure were thoroughly explained, informed consent was obtained.  The digital rectal exam revealed no abnormalities of the rectum.   The LB [id number]-[id number]  endoscope was introduced through the anus and advanced to the cecum, which was identified by both the appendix and ileocecal valve. No adverse events experienced.   The quality of the prep was excellent. (Suprep was used)  The instrument was then slowly withdrawn as the colon was fully examined. Estimated blood loss is zero unless otherwise noted in this procedure report.    COLON FINDINGS: A sessile polyp measuring 5 mm in size was found in the rectum.  A polypectomy was performed with cold forceps.  The resection was complete, the polyp tissue was completely retrieved and sent to histology.   The colonic mucosa appeared normal at the splenic flexure, in the transverse colon, sigmoid colon, descending colon, at the ileocecal valve, cecum, hepatic flexure, and in the ascending colon.  Retroflexed views revealed no abnormalities. The time to cecum = 2.0 Withdrawal time = 11.4   The scope was withdrawn and the procedure  completed. COMPLICATIONS: There were no immediate complications.  ENDOSCOPIC IMPRESSION: 1.   Sessile polyp in the rectum; polypectomy performed with cold forceps 2.   The colonic mucosa otherwise appeared normal  RECOMMENDATIONS: 1.  Await pathology results 2.  Repeat colonoscopy in 5 years if polyp adenomatous; otherwise 10 years 3.  High fiber diet with liberal fluid intake.  eSigned:  Ladene Artist, MD, [facility] 01/18/2015 8:32 AM

## 2015-01-19 ENCOUNTER — Telehealth: Payer: Self-pay | Admitting: *Deleted

## 2015-01-19 NOTE — Telephone Encounter (Signed)
  Follow up Call-  Call back number 01/18/2015 01/18/2015 01/18/2015  Post procedure Call Back phone  # [phone] -  Permission to leave phone message - - Yes     No answer and answering machine did not pick up to leave message.

## 2015-01-20 ENCOUNTER — Encounter: Payer: Self-pay | Admitting: Neurology

## 2015-01-20 ENCOUNTER — Ambulatory Visit (INDEPENDENT_AMBULATORY_CARE_PROVIDER_SITE_OTHER): Payer: 59 | Admitting: Neurology

## 2015-01-20 VITALS — BP 124/70 | HR 94 | Ht 77.0 in | Wt 189.0 lb

## 2015-01-20 DIAGNOSIS — M5090 Cervical disc disorder, unspecified, unspecified cervical region: Secondary | ICD-10-CM

## 2015-01-20 DIAGNOSIS — R51 Headache: Secondary | ICD-10-CM

## 2015-01-20 DIAGNOSIS — G4486 Cervicogenic headache: Secondary | ICD-10-CM

## 2015-01-20 MED ORDER — NORTRIPTYLINE HCL 10 MG PO CAPS: 10.0000 mg | ORAL_CAPSULE | Freq: Every day | ORAL | Status: DC

## 2015-01-20 NOTE — Patient Instructions (Signed)
1.  Start nortriptyline 10mg  at bedtime.  Call in 4 weeks with update regarding headache.   2.  Stop tramadol 3.  Will get report of cervical MRI.  Please bring disc to me for review 4.  Follow up with Dr. Arnoldo Morale 5.  Follow up in 3 months

## 2015-01-20 NOTE — Progress Notes (Signed)
NEUROLOGY CONSULTATION NOTE  Dhruva Orndoff Sprick MRN: [id number] DOB: [birth year]  Referring provider: Dr. Redmond School Primary care provider: Dr. Redmond School  Reason for consult:  Headache, neck pain  HISTORY OF PRESENT ILLNESS: Joseph [name] is a 59 year old right-handed male who presents for bilateral occipital neuralgia.  History obtained by patient and PCP note.  Cervical spine plain films personally reviewed.  In August, he began experiencing bilateral posterior neck pain.  It radiates up to the upper cervical region and down to between the shoulder blades.  He describes it as an aching pain.  About 3 days a week, it is accompanied by a headache in a band-like distribution.  He also reports burning sensation at the C7 spinal process.  He reports that a week prior to onset, he performed manual labor in the yard, such as chopping wood and pushing a wheel barrel of dirt.  He has undergone 5 weeks of PT with moderate relief.  The neck pain seems a little worse after a session.  Sometimes he notes numbness in the left arm and hand.  He has tried tramadol and Soma which were ineffective.  He was recently prescribed cyclobenzaparine and hydrocodone.  Plain films of cervical spine demonstrated multilevel degenerative disc disease with multiple osseous neural foraminal narrowing, most pronounced at C4-5, C5-6 and C6-7.  Craniocervical junctuion was unremarkable.  He saw his orthopedist who performed an MRI of the cervical spine, which is not available to me.  It reportedly showed stenosis and pinched nerves.  He was referred to Dr. Arnoldo Morale, a neurosurgeon.   He has history of lumbar disc disease and has undergone surgery.    PAST MEDICAL HISTORY: Past Medical History  Diagnosis Date  . DDD (degenerative disc disease)   . Melanoma [facility])     sees Dr. Nevada Crane.  back and chest  . GERD (gastroesophageal reflux disease)   . Alopecia   . ED (erectile dysfunction)   . Diverticulosis   . Renal stone      PAST SURGICAL HISTORY: Past Surgical History  Procedure Laterality Date  . Knee arthroscopy  2008    RIGHT  . Melanoma excision    . Lumbar disc surgery      MEDICATIONS: Current Outpatient Prescriptions on File Prior to Visit  Medication Sig Dispense Refill  . carisoprodol (SOMA) 350 MG tablet TAKE 1 TABLET 3 TIMES A DAY AS NEEDED FOR MUSCLE SPASM 30 tablet 5  . celecoxib (CELEBREX) 200 MG capsule TAKE 1 CAPSULE BY MOUTH TWICE DAILY 60 capsule 5  . cholecalciferol (VITAMIN D) 1000 UNITS tablet Take 1,000 Units by mouth daily.    . cyclobenzaprine (FLEXERIL) 10 MG tablet Take 10 mg by mouth 2 (two) times daily as needed for muscle spasms.    . ferrous sulfate 325 (65 FE) MG tablet Take 325 mg by mouth daily with breakfast.      . finasteride (PROPECIA) 1 MG tablet Take 1 mg by mouth daily. 1/4 TABLET DAILY    . fish oil-omega-3 fatty acids 1000 MG capsule Take 2 g by mouth daily.      Marland Kitchen glucosamine-chondroitin 500-400 MG tablet Take 1 tablet by mouth 3 (three) times daily.      . Multiple Vitamins-Minerals (MULTIVITAMIN WITH MINERALS) tablet Take 1 tablet by mouth daily.      Marland Kitchen selenium 50 MCG TABS Take 50 mcg by mouth daily.      . tadalafil (CIALIS) 20 MG tablet Take 1 tablet (20 mg total) by  mouth daily as needed for erectile dysfunction. 10 tablet 11  . vitamin B-12 (CYANOCOBALAMIN) 1000 MCG tablet Take 1,000 mcg by mouth daily.    . vitamin C (ASCORBIC ACID) 500 MG tablet Take 500 mg by mouth daily as needed.     . vitamin E 100 UNIT capsule Take 100 Units by mouth daily.    Marland Kitchen zolpidem (AMBIEN) 10 MG tablet TAKE 1 TABLET BY MOUTH AT BEDTIME AS NEEDED FOR SLEEP 30 tablet 1   No current facility-administered medications on file prior to visit.    ALLERGIES: Allergies  Allergen Reactions  . Daypro [Oxaprozin]     GASTRIC PROBLEMS  . Methadone Itching    FAMILY HISTORY: Family History  Problem Relation Age of Onset  . Transient ischemic attack Father   . Celiac  disease Mother   . Multiple myeloma Father   . Colon cancer Neg Hx   . Esophageal cancer Neg Hx   . Bladder Cancer Father   . Liver disease Neg Hx   . Kidney disease Neg Hx   . Colon polyps Neg Hx   . Scoliosis Mother     SOCIAL HISTORY: Social History   Social History  . Marital Status: Married    Spouse Name: N/A  . Number of Children: N/A  . Years of Education: N/A   Occupational History  . Not on file.   Social History Main Topics  . Smoking status: Former Smoker -- 1.00 packs/day    Quit date: 04/25/1994  . Smokeless tobacco: Never Used  . Alcohol Use: 2.4 oz/week    4 Cans of beer per week     Comment: less than 1 drink daily  . Drug Use: No  . Sexual Activity: Yes   Other Topics Concern  . Not on file   Social History Narrative    REVIEW OF SYSTEMS: Constitutional: No fevers, chills, or sweats, no generalized fatigue, change in appetite Eyes: No visual changes, double vision, eye pain Ear, nose and throat: No hearing loss, ear pain, nasal congestion, sore throat Cardiovascular: No chest pain, palpitations Respiratory:  No shortness of breath at rest or with exertion, wheezes GastrointestinaI: No nausea, vomiting, diarrhea, abdominal pain, fecal incontinence Genitourinary:  No dysuria, urinary retention or frequency Musculoskeletal:  No neck pain, back pain Integumentary: No rash, pruritus, skin lesions Neurological: as above Psychiatric: No depression, insomnia, anxiety Endocrine: No palpitations, fatigue, diaphoresis, mood swings, change in appetite, change in weight, increased thirst Hematologic/Lymphatic:  No anemia, purpura, petechiae. Allergic/Immunologic: no itchy/runny eyes, nasal congestion, recent allergic reactions, rashes  PHYSICAL EXAM: Filed Vitals:   01/20/15 0750  BP: 124/70  Pulse: 94   General: No acute distress.  Patient appears well-groomed.  Head:  Normocephalic/atraumatic Eyes:  fundi unremarkable, without vessel changes,  exudates, hemorrhages or papilledema. Neck: supple, no paraspinal tenderness, full range of motion Back: No paraspinal tenderness Heart: regular rate and rhythm Lungs: Clear to auscultation bilaterally. Vascular: No carotid bruits. Neurological Exam: Mental status: alert and oriented to person, place, and time, recent and remote memory intact, fund of knowledge intact, attention and concentration intact, speech fluent and not dysarthric, language intact. Cranial nerves: CN I: not tested CN II: pupils equal, round and reactive to light, visual fields intact, fundi unremarkable, without vessel changes, exudates, hemorrhages or papilledema. CN III, IV, VI:  full range of motion, no nystagmus, no ptosis CN V: facial sensation intact CN VII: upper and lower face symmetric CN VIII: hearing intact CN IX, X: gag intact,  uvula midline CN XI: sternocleidomastoid and trapezius muscles intact CN XII: tongue midline Bulk & Tone: normal, no fasciculations. Motor:  5/5 throughout  Sensation:  Pinprick sensation intact.  Decreased vibration sensation in toes. Deep Tendon Reflexes:  2+ throughout, toes downgoing.  Finger to nose testing:  Without dysmetria.  Heel to shin:  Without dysmetria.  Gait:  Normal station and stride.  Able to turn and tandem walk. Romberg negative.  IMPRESSION: Cervicogenic headache Cervical disc disease  PLAN: 1.  I asked for the images of recent cervical MRI for review 2.  He should follow up with Dr. Arnoldo Morale, as scheduled.  One option would be interventional pain management. 3.  We will try nortriptyline 44m at bedtime.  He will call in 4 weeks with update.  Advised to stop Tramadol as it is ineffective. 4.  Follow up in 3 months.  Thank you for allowing me to take part in the care of this patient.  AMetta Clines DO  CC:  JJill Alexanders MD

## 2015-01-27 ENCOUNTER — Encounter: Payer: 59 | Admitting: Gastroenterology

## 2015-01-27 ENCOUNTER — Encounter: Payer: Self-pay | Admitting: Gastroenterology

## 2015-01-31 ENCOUNTER — Other Ambulatory Visit: Payer: Self-pay

## 2015-01-31 ENCOUNTER — Other Ambulatory Visit: Payer: Self-pay | Admitting: Family Medicine

## 2015-01-31 NOTE — Telephone Encounter (Signed)
Okay to renew

## 2015-01-31 NOTE — Telephone Encounter (Signed)
Is this okay?

## 2015-02-07 ENCOUNTER — Other Ambulatory Visit: Payer: Self-pay

## 2015-02-07 ENCOUNTER — Other Ambulatory Visit: Payer: Self-pay | Admitting: Family Medicine

## 2015-02-07 NOTE — Telephone Encounter (Signed)
Is this okay?

## 2015-02-20 ENCOUNTER — Other Ambulatory Visit: Payer: Self-pay | Admitting: Family Medicine

## 2015-02-21 ENCOUNTER — Other Ambulatory Visit: Payer: Self-pay

## 2015-02-21 NOTE — Telephone Encounter (Signed)
Okay to renew

## 2015-02-21 NOTE — Telephone Encounter (Signed)
Is this okay?

## 2015-03-14 ENCOUNTER — Other Ambulatory Visit: Payer: Self-pay | Admitting: Family Medicine

## 2015-03-14 NOTE — Telephone Encounter (Signed)
Is this ok to refill?  

## 2015-03-14 NOTE — Telephone Encounter (Signed)
Called in.

## 2015-03-14 NOTE — Telephone Encounter (Signed)
Okay to renew

## 2015-03-16 ENCOUNTER — Other Ambulatory Visit: Payer: Self-pay | Admitting: Neurology

## 2015-03-17 NOTE — Telephone Encounter (Signed)
01/20/15 05/05/15 

## 2015-03-22 ENCOUNTER — Telehealth: Payer: Self-pay

## 2015-03-22 NOTE — Telephone Encounter (Signed)
Imaging CD mailed back to patient.

## 2015-03-23 ENCOUNTER — Other Ambulatory Visit: Payer: Self-pay | Admitting: Family Medicine

## 2015-03-23 NOTE — Telephone Encounter (Signed)
Ok to refill 

## 2015-03-23 NOTE — Telephone Encounter (Signed)
Okay to renew

## 2015-04-08 ENCOUNTER — Other Ambulatory Visit: Payer: Self-pay | Admitting: Family Medicine

## 2015-04-08 NOTE — Telephone Encounter (Signed)
Okay to call in. 

## 2015-04-08 NOTE — Telephone Encounter (Signed)
Is this okay to call in? 

## 2015-04-11 NOTE — Telephone Encounter (Signed)
OK 

## 2015-04-11 NOTE — Telephone Encounter (Signed)
Check with the pharmacy and see whether he is gotten all 3 refills

## 2015-04-12 ENCOUNTER — Other Ambulatory Visit: Payer: Self-pay | Admitting: *Deleted

## 2015-04-12 MED ORDER — TRAMADOL HCL 50 MG PO TABS: 50.0000 mg | ORAL_TABLET | ORAL | Status: DC | PRN

## 2015-04-22 ENCOUNTER — Other Ambulatory Visit: Payer: Self-pay | Admitting: Family Medicine

## 2015-04-22 ENCOUNTER — Other Ambulatory Visit: Payer: Self-pay | Admitting: Neurology

## 2015-04-22 NOTE — Telephone Encounter (Signed)
Is this okay to refill? 

## 2015-04-22 NOTE — Telephone Encounter (Signed)
Last OV: 01/20/15 Next OV: 05/05/15 We will try nortriptyline 10mg  at bedtime

## 2015-04-25 ENCOUNTER — Other Ambulatory Visit: Payer: Self-pay | Admitting: Family Medicine

## 2015-05-05 ENCOUNTER — Ambulatory Visit (INDEPENDENT_AMBULATORY_CARE_PROVIDER_SITE_OTHER): Payer: 59 | Admitting: Neurology

## 2015-05-05 ENCOUNTER — Encounter: Payer: Self-pay | Admitting: Neurology

## 2015-05-05 VITALS — BP 108/60 | HR 90 | Ht 77.0 in | Wt 185.0 lb

## 2015-05-05 DIAGNOSIS — M5090 Cervical disc disorder, unspecified, unspecified cervical region: Secondary | ICD-10-CM | POA: Diagnosis not present

## 2015-05-05 DIAGNOSIS — R51 Headache: Secondary | ICD-10-CM

## 2015-05-05 DIAGNOSIS — G4486 Cervicogenic headache: Secondary | ICD-10-CM

## 2015-05-05 MED ORDER — NORTRIPTYLINE HCL 25 MG PO CAPS: 25.0000 mg | ORAL_CAPSULE | Freq: Every day | ORAL | Status: DC

## 2015-05-05 NOTE — Patient Instructions (Signed)
1.  Increase nortriptyline to 25mg  at bedtime.  Call in 4 weeks with update. 2.  Try not to use tramadol while on nortriptyline 3.  Continue pain management as per your other doctors (hydrocodone, Soma, cyclobenzaprine) 4.  Follow up

## 2015-05-05 NOTE — Progress Notes (Signed)
NEUROLOGY FOLLOW UP OFFICE NOTE  Joseph [name] [id number]  HISTORY OF PRESENT ILLNESS: Joseph [name] is a 60 year old right-handed male who follows up for cervicogenic headache with cervical spondylosis.  Imaging of cervical MRI and orthopedist notes reviewed.  UPDATE: For headache, he was started on nortriptyline 90m at bedtime.  For neck pain, he takes cyclobenzaprine, hydrocodone, Soma and Tramadol.  He was evaluated by neurosurgery who sent him to pain management.  He received an epidural which was helpful for a little bit, but got worse after he strained himself shoveling snow.  Headaches occur about 3 days per week.  HISTORY: In August, he began experiencing bilateral posterior neck pain.  It radiates up to the upper cervical region and down to between the shoulder blades.  He describes it as an aching pain.  About 3 days a week, it is accompanied by a headache in a band-like distribution.  He also reports burning sensation at the C7 spinal process.  He reports that a week prior to onset, he performed manual labor in the yard, such as chopping wood and pushing a wheel barrel of dirt.  He has undergone 5 weeks of PT with moderate relief.  The neck pain seems a little worse after a session.  Sometimes he notes numbness in the left arm and hand.  He has tried tramadol and Soma which were ineffective.  He was recently prescribed cyclobenzaparine and hydrocodone.  Plain films of cervical spine demonstrated multilevel degenerative disc disease with multiple osseous neural foraminal narrowing, most pronounced at C4-5, C5-6 and C6-7.  Craniocervical junctuion was unremarkable.  MRI of cervical spine from 01/14/15.  It reveals multilevel spondylosis moderate left C3-4 foraminal stenosis, moderate to severe bilateral C5-6 foraminal narrowing and severe left and moderate right C6-7 foraminal stenosis.  There is mild central stenosis with moderate left central stenosis at C6-7.    PAST MEDICAL  HISTORY: Past Medical History  Diagnosis Date  . DDD (degenerative disc disease)   . Melanoma ([facility]     sees Dr. HNevada Crane  back and chest  . GERD (gastroesophageal reflux disease)   . Alopecia   . ED (erectile dysfunction)   . Diverticulosis   . Renal stone     MEDICATIONS: Current Outpatient Prescriptions on File Prior to Visit  Medication Sig Dispense Refill  . carisoprodol (SOMA) 350 MG tablet TAKE 1 TABLET 3 TIMES A DAY AS NEEDED FOR MUSCLE SPASM 30 tablet 1  . celecoxib (CELEBREX) 200 MG capsule TAKE 1 CAPSULE BY MOUTH TWICE DAILY 60 capsule 5  . cholecalciferol (VITAMIN D) 1000 UNITS tablet Take 1,000 Units by mouth daily.    . cyclobenzaprine (FLEXERIL) 10 MG tablet Take 10 mg by mouth 2 (two) times daily as needed for muscle spasms.    . ferrous sulfate 325 (65 FE) MG tablet Take 325 mg by mouth daily with breakfast.      . finasteride (PROPECIA) 1 MG tablet Take 1 mg by mouth daily. 1/4 TABLET DAILY    . glucosamine-chondroitin 500-400 MG tablet Take 1 tablet by mouth 3 (three) times daily.      .Marland KitchenHYDROcodone-acetaminophen (NORCO/VICODIN) 5-325 MG tablet     . Multiple Vitamins-Minerals (MULTIVITAMIN WITH MINERALS) tablet Take 1 tablet by mouth daily.      .Marland Kitchenselenium 50 MCG TABS Take 50 mcg by mouth daily.      . tadalafil (CIALIS) 20 MG tablet Take 1 tablet (20 mg total) by mouth daily as needed for  erectile dysfunction. 10 tablet 11  . traMADol (ULTRAM) 50 MG tablet Take 1-2 tablets (50-100 mg total) by mouth every 4 (four) hours as needed. for pain 30 tablet 3  . vitamin B-12 (CYANOCOBALAMIN) 1000 MCG tablet Take 1,000 mcg by mouth daily.    . vitamin C (ASCORBIC ACID) 500 MG tablet Take 500 mg by mouth daily as needed.     . vitamin E 100 UNIT capsule Take 100 Units by mouth daily.    Marland Kitchen zolpidem (AMBIEN) 10 MG tablet TAKE 1 TABLET BY MOUTH AT BEDTIME AS NEEDED FOR SLEEP 30 tablet 1  . fish oil-omega-3 fatty acids 1000 MG capsule Take 2 g by mouth daily. Reported on 05/05/2015      No current facility-administered medications on file prior to visit.    ALLERGIES: Allergies  Allergen Reactions  . Daypro [Oxaprozin]     GASTRIC PROBLEMS  . Methadone Itching    FAMILY HISTORY: Family History  Problem Relation Age of Onset  . Transient ischemic attack Father   . Celiac disease Mother   . Multiple myeloma Father   . Colon cancer Neg Hx   . Esophageal cancer Neg Hx   . Bladder Cancer Father   . Liver disease Neg Hx   . Kidney disease Neg Hx   . Colon polyps Neg Hx   . Scoliosis Mother     SOCIAL HISTORY: Social History   Social History  . Marital Status: Married    Spouse Name: N/A  . Number of Children: N/A  . Years of Education: N/A   Occupational History  . Not on file.   Social History Main Topics  . Smoking status: Former Smoker -- 1.00 packs/day    Quit date: 04/25/1994  . Smokeless tobacco: Never Used  . Alcohol Use: 2.4 oz/week    4 Cans of beer per week     Comment: less than 1 drink daily  . Drug Use: No  . Sexual Activity: Yes   Other Topics Concern  . Not on file   Social History Narrative    REVIEW OF SYSTEMS: Constitutional: No fevers, chills, or sweats, no generalized fatigue, change in appetite Eyes: No visual changes, double vision, eye pain Ear, nose and throat: No hearing loss, ear pain, nasal congestion, sore throat Cardiovascular: No chest pain, palpitations Respiratory:  No shortness of breath at rest or with exertion, wheezes GastrointestinaI: No nausea, vomiting, diarrhea, abdominal pain, fecal incontinence Genitourinary:  No dysuria, urinary retention or frequency Musculoskeletal:  Neck pain Integumentary: No rash, pruritus, skin lesions Neurological: as above Psychiatric: No depression, insomnia, anxiety Endocrine: No palpitations, fatigue, diaphoresis, mood swings, change in appetite, change in weight, increased thirst Hematologic/Lymphatic:  No anemia, purpura, petechiae. Allergic/Immunologic: no  itchy/runny eyes, nasal congestion, recent allergic reactions, rashes  PHYSICAL EXAM: Filed Vitals:   05/05/15 1508  BP: 108/60  Pulse: 90   General: No acute distress.  Patient appears well-groomed.  normal body habitus. Head:  Normocephalic/atraumatic Eyes:  Fundoscopic exam unremarkable without vessel changes, exudates, hemorrhages or papilledema. Neck: supple, no paraspinal tenderness, full range of motion Heart:  Regular rate and rhythm Lungs:  Clear to auscultation bilaterally Back: No paraspinal tenderness Neurological Exam: alert and oriented to person, place, and time. Attention span and concentration intact, recent and remote memory intact, fund of knowledge intact.  Speech fluent and not dysarthric, language intact.  CN II-XII intact. Fundoscopic exam unremarkable without vessel changes, exudates, hemorrhages or papilledema.  Bulk and tone normal, muscle strength  5/5 throughout.  Sensation to light touch, temperature and vibration intact.  Deep tendon reflexes 2+ throughout, toes downgoing.  Finger to nose and heel to shin testing intact.  Gait normal, Romberg negative.  IMPRESSION: Cervicogenic headache Cervical spondylosis with stenosis  PLAN: Increase nortriptyline to 18m at bedtime Advised to stop Tramadol while on nortriptyline and use the other pain relievers instead. Follow up in 4 months.  He is to call in 4 weeks with update.  15 minutes spent face to face with patient, over 50% spent discussing management.  AMetta Clines DO  CC:  JJill Alexanders MD

## 2015-05-23 ENCOUNTER — Other Ambulatory Visit: Payer: Self-pay | Admitting: Family Medicine

## 2015-06-01 ENCOUNTER — Telehealth: Payer: Self-pay

## 2015-06-01 MED ORDER — NORTRIPTYLINE HCL 50 MG PO CAPS: 50.0000 mg | ORAL_CAPSULE | Freq: Every day | ORAL | Status: DC

## 2015-06-01 NOTE — Telephone Encounter (Signed)
Detailed message left on Vm for pt at his requested during previous telephone call. RX sent in.

## 2015-06-01 NOTE — Telephone Encounter (Signed)
Yes.  Increase to 50mg  at bedtime with another update in 4 weeks.

## 2015-06-01 NOTE — Telephone Encounter (Signed)
Pt called with 4 week update on nortriptyline 25 mg. It was increased from 10 mg. States he feels like medication is helping, but he is still experiencing 2-3 headaches a week. Would like to know if possible to increase further. Please advise.   [phone] - Vm okay.

## 2015-06-06 ENCOUNTER — Other Ambulatory Visit: Payer: Self-pay | Admitting: Family Medicine

## 2015-06-06 NOTE — Telephone Encounter (Signed)
Okay to renew

## 2015-06-06 NOTE — Telephone Encounter (Signed)
Is this okay to refill? Just filled on 04/25/15 for #30 with 1 refill.

## 2015-07-21 ENCOUNTER — Other Ambulatory Visit: Payer: Self-pay | Admitting: Family Medicine

## 2015-07-21 NOTE — Telephone Encounter (Signed)
Is this okay to refill? 

## 2015-07-22 NOTE — Telephone Encounter (Signed)
ok 

## 2015-08-01 ENCOUNTER — Other Ambulatory Visit: Payer: Self-pay | Admitting: Neurology

## 2015-08-29 ENCOUNTER — Other Ambulatory Visit: Payer: Self-pay | Admitting: Family Medicine

## 2015-08-29 NOTE — Telephone Encounter (Signed)
Is this okay to refill? 

## 2015-08-29 NOTE — Telephone Encounter (Signed)
Okay to renew

## 2015-08-29 NOTE — Telephone Encounter (Signed)
Called in.

## 2015-09-12 ENCOUNTER — Encounter: Payer: Self-pay | Admitting: Neurology

## 2015-09-12 ENCOUNTER — Ambulatory Visit (INDEPENDENT_AMBULATORY_CARE_PROVIDER_SITE_OTHER): Payer: 59 | Admitting: Neurology

## 2015-09-12 VITALS — BP 118/84 | HR 120 | Ht 77.0 in | Wt 189.9 lb

## 2015-09-12 DIAGNOSIS — R51 Headache: Secondary | ICD-10-CM | POA: Diagnosis not present

## 2015-09-12 DIAGNOSIS — G4486 Cervicogenic headache: Secondary | ICD-10-CM

## 2015-09-12 DIAGNOSIS — M5090 Cervical disc disorder, unspecified, unspecified cervical region: Secondary | ICD-10-CM | POA: Diagnosis not present

## 2015-09-12 NOTE — Progress Notes (Signed)
NEUROLOGY FOLLOW UP OFFICE NOTE  Joseph [name] [id number]  HISTORY OF PRESENT ILLNESS: Joseph [name] is a 60 year old right-handed male who follows up for cervicogenic headache with cervical spondylosis.    UPDATE: For headache, he is taking nortriptyline 91m at bedtime.  For neck pain, he takes cyclobenzaprine, hydrocodone, and Soma.  He uses TENS unit and home stretching exercises.  Headaches have resolved but still notes pain between his shoulder blades.  HISTORY: In August, he began experiencing bilateral posterior neck pain.  It radiates up to the upper cervical region and down to between the shoulder blades.  He describes it as an aching pain.  About 3 days a week, it is accompanied by a headache in a band-like distribution.  He also reports burning sensation at the C7 spinal process.  He reports that a week prior to onset, he performed manual labor in the yard, such as chopping wood and pushing a wheel barrel of dirt.  He has undergone 5 weeks of PT with moderate relief.  The neck pain seems a little worse after a session.  Sometimes he notes numbness in the left arm and hand.  He has tried tramadol and Soma which were ineffective.  He was recently prescribed cyclobenzaparine and hydrocodone.  Plain films of cervical spine demonstrated multilevel degenerative disc disease with multiple osseous neural foraminal narrowing, most pronounced at C4-5, C5-6 and C6-7.  Craniocervical junctuion was unremarkable.  MRI of cervical spine from 01/14/15.  It reveals multilevel spondylosis moderate left C3-4 foraminal stenosis, moderate to severe bilateral C5-6 foraminal narrowing and severe left and moderate right C6-7 foraminal stenosis.  There is mild central stenosis with moderate left central stenosis at C6-7.   He was evaluated by spine surgeon who sent him to pain management.  He received an epidural which was helpful  PAST MEDICAL HISTORY: Past Medical History  Diagnosis Date  . DDD  (degenerative disc disease)   . Melanoma ([facility]     sees Dr. HNevada Crane  back and chest  . GERD (gastroesophageal reflux disease)   . Alopecia   . ED (erectile dysfunction)   . Diverticulosis   . Renal stone     MEDICATIONS: Current Outpatient Prescriptions on File Prior to Visit  Medication Sig Dispense Refill  . carisoprodol (SOMA) 350 MG tablet TAKE 1 TABLET 3 TIMES A DAY AS NEEDED FOR MUSCLE SPASMS 30 tablet 1  . celecoxib (CELEBREX) 200 MG capsule TAKE 1 CAPSULE BY MOUTH TWICE DAILY 60 capsule 5  . cholecalciferol (VITAMIN D) 1000 UNITS tablet Take 1,000 Units by mouth daily.    . cyclobenzaprine (FLEXERIL) 10 MG tablet Take 10 mg by mouth 2 (two) times daily as needed for muscle spasms.    . ferrous sulfate 325 (65 FE) MG tablet Take 325 mg by mouth daily with breakfast.      . finasteride (PROPECIA) 1 MG tablet Take 1 mg by mouth daily. 1/4 TABLET DAILY    . fish oil-omega-3 fatty acids 1000 MG capsule Take 2 g by mouth daily. Reported on 05/05/2015    . glucosamine-chondroitin 500-400 MG tablet Take 1 tablet by mouth 3 (three) times daily.      .Marland KitchenHYDROcodone-acetaminophen (NORCO/VICODIN) 5-325 MG tablet     . Multiple Vitamins-Minerals (MULTIVITAMIN WITH MINERALS) tablet Take 1 tablet by mouth daily.      . nortriptyline (PAMELOR) 50 MG capsule TAKE 1 CAPSULE (50 MG TOTAL) BY MOUTH AT BEDTIME. 30 capsule 3  . selenium 50  MCG TABS Take 50 mcg by mouth daily.      . tadalafil (CIALIS) 20 MG tablet Take 1 tablet (20 mg total) by mouth daily as needed for erectile dysfunction. 10 tablet 11  . traMADol (ULTRAM) 50 MG tablet Take 1-2 tablets (50-100 mg total) by mouth every 4 (four) hours as needed. for pain 30 tablet 3  . vitamin B-12 (CYANOCOBALAMIN) 1000 MCG tablet Take 1,000 mcg by mouth daily.    . vitamin C (ASCORBIC ACID) 500 MG tablet Take 500 mg by mouth daily as needed.     . vitamin E 100 UNIT capsule Take 100 Units by mouth daily.    Marland Kitchen zolpidem (AMBIEN) 10 MG tablet TAKE 1 TABLET  AT BEDTIME 30 tablet 0   No current facility-administered medications on file prior to visit.    ALLERGIES: Allergies  Allergen Reactions  . Daypro [Oxaprozin]     GASTRIC PROBLEMS  . Methadone Itching    FAMILY HISTORY: Family History  Problem Relation Age of Onset  . Transient ischemic attack Father   . Celiac disease Mother   . Multiple myeloma Father   . Colon cancer Neg Hx   . Esophageal cancer Neg Hx   . Bladder Cancer Father   . Liver disease Neg Hx   . Kidney disease Neg Hx   . Colon polyps Neg Hx   . Scoliosis Mother     SOCIAL HISTORY: Social History   Social History  . Marital Status: Married    Spouse Name: N/A  . Number of Children: N/A  . Years of Education: N/A   Occupational History  . Not on file.   Social History Main Topics  . Smoking status: Former Smoker -- 1.00 packs/day    Quit date: 04/25/1994  . Smokeless tobacco: Never Used  . Alcohol Use: 2.4 oz/week    4 Cans of beer per week     Comment: less than 1 drink daily  . Drug Use: No  . Sexual Activity: Yes   Other Topics Concern  . Not on file   Social History Narrative    REVIEW OF SYSTEMS: Constitutional: No fevers, chills, or sweats, no generalized fatigue, change in appetite Eyes: No visual changes, double vision, eye pain Ear, nose and throat: No hearing loss, ear pain, nasal congestion, sore throat Cardiovascular: No chest pain, palpitations Respiratory:  No shortness of breath at rest or with exertion, wheezes GastrointestinaI: No nausea, vomiting, diarrhea, abdominal pain, fecal incontinence Genitourinary:  No dysuria, urinary retention or frequency Musculoskeletal:  No neck pain, back pain Integumentary: No rash, pruritus, skin lesions Neurological: as above Psychiatric: No depression, insomnia, anxiety Endocrine: No palpitations, fatigue, diaphoresis, mood swings, change in appetite, change in weight, increased thirst Hematologic/Lymphatic:  No purpura,  petechiae. Allergic/Immunologic: no itchy/runny eyes, nasal congestion, recent allergic reactions, rashes  PHYSICAL EXAM: Filed Vitals:   09/12/15 1426  BP: 118/84  Pulse: 120   General: No acute distress.  Patient appears well-groomed.  normal body habitus. Head:  Normocephalic/atraumatic Eyes:  Fundi examined but not visualized Neck: supple, no paraspinal tenderness, full range of motion Heart:  Regular rate and rhythm Lungs:  Clear to auscultation bilaterally Back: No paraspinal tenderness Neurological Exam: alert and oriented to person, place, and time. Attention span and concentration intact, recent and remote memory intact, fund of knowledge intact.  Speech fluent and not dysarthric, language intact.  CN II-XII intact. Bulk and tone normal, muscle strength 5/5 throughout.  Sensation to light touch, temperature and  vibration intact.  Deep tendon reflexes 2+ throughout, toes downgoing.  Finger to nose and heel to shin testing intact.  Gait normal, Romberg negative.  IMPRESSION: Cervicogenic headache Cervical spondylosis with stenosis  PLAN: 1.  Nortriptyline 29m at bedtime 2.  Treatment of neck pain with muscle relaxants, TENS, and stretching exercises as prescribed by other doctors. 3.  Follow up in 6 months.  15 minutes spent face to face with patient, over 50% spent discussing management.  AMetta Clines DO  CC:  JJill Alexanders MD

## 2015-09-12 NOTE — Patient Instructions (Addendum)
1.  Continue nortriptyline 50mg  at bedtime 2.  Continue muscle relaxants, TENS unit and home exercises for upper back and neck as prescribed by your other doctors. 3.  Try to maintain good posture. 4.  Follow up in 6 months.

## 2015-09-21 ENCOUNTER — Other Ambulatory Visit: Payer: Self-pay | Admitting: Family Medicine

## 2015-09-21 ENCOUNTER — Other Ambulatory Visit: Payer: Self-pay

## 2015-09-21 NOTE — Telephone Encounter (Signed)
Called in per jcl 

## 2015-09-21 NOTE — Telephone Encounter (Signed)
Is this okay to refill? 

## 2015-09-21 NOTE — Telephone Encounter (Signed)
Okay to renew

## 2015-10-06 ENCOUNTER — Other Ambulatory Visit: Payer: Self-pay | Admitting: Family Medicine

## 2015-10-07 NOTE — Telephone Encounter (Signed)
Is this ok to refill? Last refilled 08/29/15

## 2015-10-07 NOTE — Telephone Encounter (Signed)
Phoned in.

## 2015-10-07 NOTE — Telephone Encounter (Signed)
ok 

## 2015-10-17 ENCOUNTER — Other Ambulatory Visit: Payer: Self-pay | Admitting: Family Medicine

## 2015-10-17 NOTE — Telephone Encounter (Signed)
Is this okay to refill? 

## 2015-10-28 ENCOUNTER — Encounter (HOSPITAL_COMMUNITY): Payer: Self-pay

## 2015-10-28 ENCOUNTER — Emergency Department (HOSPITAL_COMMUNITY): Payer: 59

## 2015-10-28 ENCOUNTER — Emergency Department (HOSPITAL_COMMUNITY)
Admission: EM | Admit: 2015-10-28 | Discharge: 2015-10-28 | Disposition: A | Discharge status: Home / Self Care | Payer: 59 | Attending: Emergency Medicine | Admitting: Emergency Medicine

## 2015-10-28 DIAGNOSIS — Z87891 Personal history of nicotine dependence: Secondary | ICD-10-CM | POA: Insufficient documentation

## 2015-10-28 DIAGNOSIS — W1839XA Other fall on same level, initial encounter: Secondary | ICD-10-CM | POA: Insufficient documentation

## 2015-10-28 DIAGNOSIS — S43015A Anterior dislocation of left humerus, initial encounter: Secondary | ICD-10-CM

## 2015-10-28 DIAGNOSIS — Y999 Unspecified external cause status: Secondary | ICD-10-CM | POA: Diagnosis not present

## 2015-10-28 DIAGNOSIS — S4991XA Unspecified injury of right shoulder and upper arm, initial encounter: Secondary | ICD-10-CM | POA: Diagnosis present

## 2015-10-28 DIAGNOSIS — Y9389 Activity, other specified: Secondary | ICD-10-CM | POA: Diagnosis not present

## 2015-10-28 DIAGNOSIS — Y92009 Unspecified place in unspecified non-institutional (private) residence as the place of occurrence of the external cause: Secondary | ICD-10-CM | POA: Diagnosis not present

## 2015-10-28 DIAGNOSIS — Z79899 Other long term (current) drug therapy: Secondary | ICD-10-CM | POA: Diagnosis not present

## 2015-10-28 DIAGNOSIS — S43004A Unspecified dislocation of right shoulder joint, initial encounter: Secondary | ICD-10-CM | POA: Insufficient documentation

## 2015-10-28 MED ORDER — HYDROCODONE-ACETAMINOPHEN 5-325 MG PO TABS
1.0000 | ORAL_TABLET | Freq: Once | ORAL | Status: AC
  Administered 2015-10-28: 1 via ORAL
  Filled 2015-10-28: qty 1

## 2015-10-28 MED ORDER — HYDROCODONE-ACETAMINOPHEN 5-325 MG PO TABS: 1.0000 | ORAL_TABLET | Freq: Four times a day (QID) | ORAL | 0 refills | Status: DC | PRN

## 2015-10-28 MED ORDER — FENTANYL CITRATE (PF) 100 MCG/2ML IJ SOLN
100.0000 ug | Freq: Once | INTRAMUSCULAR | Status: DC
  Filled 2015-10-28: qty 2

## 2015-10-28 MED ORDER — KETAMINE HCL-SODIUM CHLORIDE 100-0.9 MG/10ML-% IV SOSY
1.0000 mg/kg | PREFILLED_SYRINGE | Freq: Once | INTRAVENOUS | Status: AC
  Administered 2015-10-28: 86 mg via INTRAVENOUS
  Filled 2015-10-28: qty 10

## 2015-10-28 MED ORDER — PROPOFOL 10 MG/ML IV BOLUS
1.0000 mg/kg | Freq: Once | INTRAVENOUS | Status: AC
  Administered 2015-10-28: 86.2 mg via INTRAVENOUS
  Filled 2015-10-28: qty 20

## 2015-10-28 NOTE — ED Provider Notes (Signed)
[name] DEPT Provider Note   CSN: [id number] Arrival date & time: 10/28/15  2876  First Provider Contact:  First MD Initiated Contact with Patient 10/28/15 0344        History   Chief Complaint No chief complaint on file.   HPI Joseph [name] is a 60 y.o. male.  HPI Pt comes in with cc of fall. Pt reports fall prior to ER arrival while he was fixing something at his home. Pt fell on to his R side and believes he dislocated the shoulder. Pt has severe pain. + tingling in his fingers. Pt has no hx of shoulder injury. He has no pain elsewhere. Pt didn't strike his hands. Last meal was at 10 pm. Pt has no hx of severe reaction to anesthesia in the past.  Past Medical History:  Diagnosis Date  . Alopecia   . DDD (degenerative disc disease)   . Diverticulosis   . ED (erectile dysfunction)   . GERD (gastroesophageal reflux disease)   . Melanoma [facility])    sees Dr. [place] Crane.  back and chest  . Renal stone     Patient Active Problem List   Diagnosis Date Noted  . Cervical disc disease 01/20/2015  . Premature atrial contractions 01/17/2015  . Varicose veins 01/17/2015  . History of melanoma 01/17/2015  . Glucose intolerance (impaired glucose tolerance) 05/24/2014  . Chronic back pain 04/26/2011  . History of kidney stones 04/26/2011  . ED (erectile dysfunction) 04/26/2011  . GERD (gastroesophageal reflux disease) 04/26/2011    Past Surgical History:  Procedure Laterality Date  . KNEE ARTHROSCOPY  2008   RIGHT  . LUMBAR DISC SURGERY    . MELANOMA EXCISION         Home Medications    Prior to Admission medications   Medication Sig Start Date End Date Taking? Authorizing Provider  carisoprodol (SOMA) 350 MG tablet TAKE 1 TABLET THREE TIMES A DAY AS NEEDED FOR MUSCLE SPASMS 09/21/15  Yes Denita Lung, MD  celecoxib (CELEBREX) 200 MG capsule TAKE ONE CAPSULE BY MOUTH TWICE A DAY 10/17/15  Yes Denita Lung, MD  cyclobenzaprine (FLEXERIL) 10 MG tablet Take 10 mg by  mouth 2 (two) times daily as needed for muscle spasms.   Yes Historical Provider, MD  finasteride (PROPECIA) 1 MG tablet Take 1 mg by mouth daily. 1/4 TABLET DAILY   Yes Historical Provider, MD  glucosamine-chondroitin 500-400 MG tablet Take 1 tablet by mouth 3 (three) times daily.     Yes Historical Provider, MD  HYDROcodone-acetaminophen (NORCO/VICODIN) 5-325 MG tablet Take 1 tablet by mouth 2 (two) times daily.  01/19/15  Yes Historical Provider, MD  Multiple Vitamins-Minerals (MULTIVITAMIN WITH MINERALS) tablet Take 1 tablet by mouth daily.     Yes Historical Provider, MD  nortriptyline (PAMELOR) 50 MG capsule TAKE 1 CAPSULE (50 MG TOTAL) BY MOUTH AT BEDTIME. 08/01/15  Yes Adam Telford Nab, DO  zolpidem (AMBIEN) 10 MG tablet TAKE 1 TABLET BY MOUTH AT BEDTIME AS NEEDED FOR SLEEP 10/07/15  Yes Denita Lung, MD  tadalafil (CIALIS) 20 MG tablet Take 1 tablet (20 mg total) by mouth daily as needed for erectile dysfunction. Patient not taking: Reported on 10/28/2015 10/19/14   Denita Lung, MD  traMADol (ULTRAM) 50 MG tablet Take 1-2 tablets (50-100 mg total) by mouth every 4 (four) hours as needed. for pain Patient not taking: Reported on 10/28/2015 04/12/15   Denita Lung, MD    Family History Family History  Problem Relation Age of Onset  . Transient ischemic attack Father   . Multiple myeloma Father   . Bladder Cancer Father   . Celiac disease Mother   . Scoliosis Mother   . Colon cancer Neg Hx   . Esophageal cancer Neg Hx   . Liver disease Neg Hx   . Kidney disease Neg Hx   . Colon polyps Neg Hx     Social History Social History  Substance Use Topics  . Smoking status: Former Smoker    Packs/day: 1.00    Quit date: 04/25/1994  . Smokeless tobacco: Never Used  . Alcohol use 2.4 oz/week    4 Cans of beer per week     Comment: less than 1 drink daily     Allergies   Daypro [oxaprozin] and Methadone   Review of Systems Review of Systems  Musculoskeletal: Positive for  arthralgias.  Neurological: Positive for numbness.  All other systems reviewed and are negative.    Physical Exam Updated Vital Signs BP 142/92   Pulse 88   Temp 97.8 F (36.6 C) (Oral)   Resp 13   Ht 6' 5"  (1.956 m)   Wt 190 lb (86.2 kg)   SpO2 100%   BMI 22.53 kg/m   Physical Exam  Constitutional: He is oriented to person, place, and time. He appears well-developed.  HENT:  Head: Normocephalic and atraumatic.  Eyes: Conjunctivae and EOM are normal. Pupils are equal, round, and reactive to light.  Neck: Normal range of motion. Neck supple.  Cardiovascular: Normal rate, regular rhythm and normal heart sounds.   Pulmonary/Chest: Effort normal and breath sounds normal. No respiratory distress. He has no wheezes.  Abdominal: Soft. Bowel sounds are normal. He exhibits no distension. There is no tenderness. There is no rebound and no guarding.  Musculoskeletal:  Shoulder swelling and deformity  Neurological: He is alert and oriented to person, place, and time.  No numbness over the deltoid region, gross sensory exam is normal.  Skin: Skin is warm.     ED Treatments / Results  Labs (all labs ordered are listed, but only abnormal results are displayed) Labs Reviewed - No data to display  EKG  EKG Interpretation None       Radiology Dg Shoulder Right  Result Date: 10/28/2015 CLINICAL DATA:  60 year old male with fall and right shoulder dislocation. EXAM: RIGHT SHOULDER - 2+ VIEW COMPARISON:  None. FINDINGS: There is anterior dislocation of the right shoulder. No obvious fracture identified. However evaluation for fracture is very limited due to patient's body habitus and attenuation of the osseous structures by overlying soft tissue. IMPRESSION: Anterior dislocation of the right shoulder. Electronically Signed   By: Anner Crete M.D.   On: 10/28/2015 04:08    Procedures .Sedation Date/Time: 10/28/2015 6:22 AM Performed by: Varney Biles Authorized by: Varney Biles   Consent:    Consent obtained:  Written   Consent given by:  Patient   Risks discussed:  Allergic reaction, dysrhythmia, prolonged hypoxia resulting in organ damage, respiratory compromise necessitating ventilatory assistance and intubation and vomiting   Alternatives discussed:  Analgesia without sedation Indications:    Procedure performed:  Dislocation reduction   Procedure necessitating sedation performed by:  Physician performing sedation   Intended level of sedation:  Deep Pre-sedation assessment:    Time since last food or drink:  7 hours   ASA classification: class 2 - patient with mild systemic disease     Neck mobility: reduced  Mouth opening:  3 or more finger widths   Mallampati score:  II - soft palate, uvula, fauces visible   Pre-sedation assessments completed and reviewed: airway patency, cardiovascular function, hydration status, mental status, nausea/vomiting and respiratory function     Pre-sedation assessments completed and reviewed: pain level not reviewed     History of difficult intubation: no     Pre-sedation assessment completed:  10/28/2015 4:24 AM Immediate pre-procedure details:    Reassessment: Patient reassessed immediately prior to procedure     Reviewed: vital signs     Verified: bag valve mask available, emergency equipment available, intubation equipment available, IV patency confirmed, oxygen available and suction available   Procedure details (see MAR for exact dosages):    Sedation start time:  10/28/2015 4:30 AM   Preoxygenation:  Nasal cannula   Sedation:  Propofol   Analgesia:  Fentanyl (ketamine)   Intra-procedure monitoring:  Blood pressure monitoring, continuous capnometry, continuous pulse oximetry, frequent LOC assessments, frequent vital sign checks and cardiac monitor   Intra-procedure events: none     Sedation end time:  10/28/2015 4:55 AM   Total sedation time (minutes):  25 Post-procedure details:    Post-sedation assessment  completed:  10/28/2015 5:35 AM   Attendance: Constant attendance by certified staff until patient recovered     Recovery: Patient returned to pre-procedure baseline     Estimated blood loss (see I/O flowsheets): no     Patient is stable for discharge or admission: yes     Patient tolerance:  Tolerated well, no immediate complications Reduction of dislocation Date/Time: 10/28/2015 4:40 AM Performed by: Varney Biles Authorized by: Varney Biles  Consent: Written consent obtained. Risks and benefits: risks, benefits and alternatives were discussed Consent given by: patient Patient understanding: patient states understanding of the procedure being performed Patient consent: the patient's understanding of the procedure matches consent given Procedure consent: procedure consent matches procedure scheduled Relevant documents: relevant documents present and verified Test results: test results available and properly labeled Site marked: the operative site was marked Imaging studies: imaging studies available Patient identity confirmed: arm band Time out: Immediately prior to procedure a "time out" was called to verify the correct patient, procedure, equipment, support staff and site/side marked as required. Patient tolerance: Patient tolerated the procedure well with no immediate complications    (including critical care time)  Medications Ordered in ED Medications  fentaNYL (SUBLIMAZE) injection 100 mcg (not administered)  ketamine 100 mg in normal saline 10 mL (82m/mL) syringe (86 mg Intravenous Given 10/28/15 0436)  propofol (DIPRIVAN) 10 mg/mL bolus/IV push 86.2 mg (86.2 mg Intravenous Given 10/28/15 0436)     Initial Impression / Assessment and Plan / ED Course  I have reviewed the triage vital signs and the nursing notes.  Pertinent labs & imaging results that were available during my care of the patient were reviewed by me and considered in my medical decision making (see chart for  details).  Clinical Course   Pt comes in post fall with shoulder dislocation. Shoulder reduced in the ER. Pt tolerated the procedure well. He has no proximal numbness - just some tingling in his digits.    Final Clinical Impressions(s) / ED Diagnoses   Final diagnoses:  Anterior shoulder dislocation, left, initial encounter    New Prescriptions New Prescriptions   No medications on file     AVarney Biles MD 10/28/15 [phone]

## 2015-10-28 NOTE — ED Notes (Signed)
Bed: WA06 Expected date:  Expected time:  Means of arrival:  Comments: 

## 2015-10-28 NOTE — ED Notes (Signed)
EMS gave 250 mcg fentanyl and 750mg  of NS in route

## 2015-10-28 NOTE — ED Notes (Signed)
Pt was trying to get a bug off the ceiling and lost his balance and fell and caught himself with his right arm, pt has obvious deformity to right shoulder

## 2015-11-02 ENCOUNTER — Ambulatory Visit (INDEPENDENT_AMBULATORY_CARE_PROVIDER_SITE_OTHER): Payer: 59 | Admitting: Medical

## 2015-11-02 VITALS — BP 120/80 | HR 95 | Resp 18 | Wt 190.8 lb

## 2015-11-02 DIAGNOSIS — T148XXA Other injury of unspecified body region, initial encounter: Secondary | ICD-10-CM

## 2015-11-02 DIAGNOSIS — Z23 Encounter for immunization: Secondary | ICD-10-CM

## 2015-11-02 DIAGNOSIS — S43004D Unspecified dislocation of right shoulder joint, subsequent encounter: Secondary | ICD-10-CM | POA: Diagnosis not present

## 2015-11-02 DIAGNOSIS — T148 Other injury of unspecified body region: Secondary | ICD-10-CM

## 2015-11-02 DIAGNOSIS — L089 Local infection of the skin and subcutaneous tissue, unspecified: Secondary | ICD-10-CM

## 2015-11-02 MED ORDER — MUPIROCIN 2 % EX OINT: 1.0000 "application " | TOPICAL_OINTMENT | Freq: Two times a day (BID) | CUTANEOUS | 0 refills | Status: DC

## 2015-11-02 MED ORDER — CEPHALEXIN 500 MG PO CAPS: 500.0000 mg | ORAL_CAPSULE | Freq: Two times a day (BID) | ORAL | 0 refills | Status: DC

## 2015-11-02 NOTE — Addendum Note (Signed)
Addended by: Arley Phenix L on: 11/02/2015 03:27 PM   Modules accepted: Orders

## 2015-11-02 NOTE — Progress Notes (Signed)
Subjective: Chief Complaint  Patient presents with  . Abrasion    on right elbow- reports fall last Friday with shoulder dislocation and has a sore on Right elbow.   . Gastroesophageal Reflux   Here for possible infection.   Friday 5 days ago was trying to get a spider of his ceiling, fell off the bed and dislocated his shoulder.   Went to the ED, had the shoulder reset with conscious sedation.  Somehow on the way down his his elbow and scraped it in 2 places.   Over last few days has gotten redness, some pain, some pus drainage.   No fever.  The elbow wound is a little better today than last 2 days.   Has f/u with ortho this Friday, [facility].  Using arm sling.   No other aggravating or relieving factors. No other complaint.  Past Medical History:  Diagnosis Date  . Alopecia   . DDD (degenerative disc disease)   . Diverticulosis   . ED (erectile dysfunction)   . GERD (gastroesophageal reflux disease)   . Melanoma ([facility])    sees Dr. [place] Crane.  back and chest  . Renal stone    ROS as in subjective   Objective: BP 120/80   Pulse 95   Resp 18   Wt 190 lb 12.8 oz (86.5 kg)   SpO2 97%   BMI 22.63 kg/m   Gen: wd, wn, nad Right medial elbow region with 2 abrasions and surrounding erythema and swelling.   No induration, no fluctuance.   There is some purulent drainage on the gauze the has.   Elbow ROM ok.  He is wearing sling and limiting shoulder ROM.  Has purplish yellow bruising of anterior and lateral and medial right upper arm. Arm with normal pulses, cap refill, strength seems to be WNL of forearm, hand    Assessment: Encounter Diagnoses  Name Primary?  . Skin infection Yes  . Shoulder dislocation, right, subsequent encounter   . Abrasion     Plan: Keep abrasion/wound clean with soap and water, change bandage daily, begin medications below, f/u with ortho on Friday.  If worse or not improving of the redness and wound in the next 3-4 days, then call or recheck.   Tonio  was seen today for abrasion and gastroesophageal reflux.  Diagnoses and all orders for this visit:  Skin infection  Shoulder dislocation, right, subsequent encounter  Abrasion  Other orders -     mupirocin ointment (BACTROBAN) 2 %; Apply 1 application topically 2 (two) times daily. -     cephALEXin (KEFLEX) 500 MG capsule; Take 1 capsule (500 mg total) by mouth 2 (two) times daily.

## 2015-11-29 ENCOUNTER — Other Ambulatory Visit: Payer: Self-pay | Admitting: Family Medicine

## 2015-11-29 ENCOUNTER — Other Ambulatory Visit: Payer: Self-pay | Admitting: Neurology

## 2015-11-29 NOTE — Telephone Encounter (Signed)
Okay to renew

## 2015-11-29 NOTE — Telephone Encounter (Signed)
Is this okay to refill? 

## 2015-11-29 NOTE — Telephone Encounter (Signed)
Called in ambien 

## 2016-01-05 ENCOUNTER — Other Ambulatory Visit: Payer: Self-pay | Admitting: Family Medicine

## 2016-01-05 NOTE — Telephone Encounter (Signed)
ok 

## 2016-01-05 NOTE — Telephone Encounter (Signed)
Is this okay to refill? 

## 2016-01-05 NOTE — Telephone Encounter (Signed)
Called in.

## 2016-02-06 ENCOUNTER — Other Ambulatory Visit: Payer: Self-pay | Admitting: Family Medicine

## 2016-02-06 NOTE — Telephone Encounter (Signed)
Is this okay to refill? 

## 2016-02-06 NOTE — Telephone Encounter (Signed)
ok 

## 2016-02-06 NOTE — Telephone Encounter (Signed)
Called in per jcl 

## 2016-03-14 ENCOUNTER — Other Ambulatory Visit: Payer: Self-pay | Admitting: Family Medicine

## 2016-03-14 ENCOUNTER — Encounter: Payer: Self-pay | Admitting: Neurology

## 2016-03-14 ENCOUNTER — Ambulatory Visit (INDEPENDENT_AMBULATORY_CARE_PROVIDER_SITE_OTHER): Payer: 59 | Admitting: Neurology

## 2016-03-14 VITALS — BP 138/86 | HR 107 | Ht 78.0 in | Wt 189.0 lb

## 2016-03-14 DIAGNOSIS — R51 Headache: Secondary | ICD-10-CM | POA: Diagnosis not present

## 2016-03-14 DIAGNOSIS — M47812 Spondylosis without myelopathy or radiculopathy, cervical region: Secondary | ICD-10-CM | POA: Diagnosis not present

## 2016-03-14 DIAGNOSIS — G4486 Cervicogenic headache: Secondary | ICD-10-CM

## 2016-03-14 NOTE — Telephone Encounter (Signed)
Called in [place] per [employer]

## 2016-03-14 NOTE — Telephone Encounter (Signed)
Okay 

## 2016-03-14 NOTE — Progress Notes (Signed)
NEUROLOGY FOLLOW UP OFFICE NOTE  Jose Corvin Lory [id number]  HISTORY OF PRESENT ILLNESS: Joseph [name] is a 60 year old right-handed male who follows up for cervicogenic headache with cervical spondylosis.     UPDATE: For headache, he is taking nortriptyline 34m at bedtime.  For neck pain, he takes cyclobenzaprine, hydrocodone, and Soma.  He uses a traction unit on the neck.  He has had maybe 5 headaches over the past 6 months.   HISTORY: In August, he began experiencing bilateral posterior neck pain.  It radiates up to the upper cervical region and down to between the shoulder blades.  He describes it as an aching pain.  About 3 days a week, it is accompanied by a headache in a band-like distribution.  He also reports burning sensation at the C7 spinal process.  He reports that a week prior to onset, he performed manual labor in the yard, such as chopping wood and pushing a wheel barrel of dirt.  He has undergone 5 weeks of PT with moderate relief.  The neck pain seems a little worse after a session.  Sometimes he notes numbness in the left arm and hand.  He has tried tramadol and Soma which were ineffective.  He was recently prescribed cyclobenzaparine and hydrocodone.   Plain films of cervical spine demonstrated multilevel degenerative disc disease with multiple osseous neural foraminal narrowing, most pronounced at C4-5, C5-6 and C6-7.  Craniocervical junctuion was unremarkable.  MRI of cervical spine from 01/14/15.  It reveals multilevel spondylosis moderate left C3-4 foraminal stenosis, moderate to severe bilateral C5-6 foraminal narrowing and severe left and moderate right C6-7 foraminal stenosis.  There is mild central stenosis with moderate left central stenosis at C6-7.    He was evaluated by spine surgeon who sent him to pain management.  He received an epidural which was helpful  PAST MEDICAL HISTORY: Past Medical History:  Diagnosis Date  . Alopecia   . DDD (degenerative  disc disease)   . Diverticulosis   . ED (erectile dysfunction)   . GERD (gastroesophageal reflux disease)   . Melanoma ([facility]    sees Dr. [place] Crane  back and chest  . Renal stone     MEDICATIONS: Current Outpatient Prescriptions on File Prior to Visit  Medication Sig Dispense Refill  . carisoprodol (SOMA) 350 MG tablet TAKE 1 TABLET THREE TIMES A DAY AS NEEDED FOR MUSCLE SPASMS 30 tablet 5  . celecoxib (CELEBREX) 200 MG capsule TAKE ONE CAPSULE BY MOUTH TWICE A DAY 60 capsule 5  . cyclobenzaprine (FLEXERIL) 10 MG tablet Take 10 mg by mouth 2 (two) times daily as needed for muscle spasms.    . finasteride (PROPECIA) 1 MG tablet Take 1 mg by mouth daily. 1/4 TABLET DAILY    . HYDROcodone-acetaminophen (NORCO/VICODIN) 5-325 MG tablet Take 1 tablet by mouth every 6 (six) hours as needed. 8 tablet 0  . Multiple Vitamins-Minerals (MULTIVITAMIN WITH MINERALS) tablet Take 1 tablet by mouth daily.      . mupirocin ointment (BACTROBAN) 2 % Apply 1 application topically 2 (two) times daily. 22 g 0  . nortriptyline (PAMELOR) 50 MG capsule TAKE 1 CAPSULE (50 MG TOTAL) BY MOUTH AT BEDTIME. 30 capsule 4  . tadalafil (CIALIS) 20 MG tablet Take 1 tablet (20 mg total) by mouth daily as needed for erectile dysfunction. 10 tablet 11   No current facility-administered medications on file prior to visit.     ALLERGIES: Allergies  Allergen Reactions  . Daypro [  Oxaprozin]     GASTRIC PROBLEMS  . Methadone Itching    FAMILY HISTORY: Family History  Problem Relation Age of Onset  . Transient ischemic attack Father   . Multiple myeloma Father   . Bladder Cancer Father   . Celiac disease Mother   . Scoliosis Mother   . Colon cancer Neg Hx   . Esophageal cancer Neg Hx   . Liver disease Neg Hx   . Kidney disease Neg Hx   . Colon polyps Neg Hx     SOCIAL HISTORY: Social History   Social History  . Marital status: Married    Spouse name: N/A  . Number of children: N/A  . Years of education: N/A    Occupational History  . Not on file.   Social History Main Topics  . Smoking status: Former Smoker    Packs/day: 1.00    Quit date: 04/25/1994  . Smokeless tobacco: Never Used  . Alcohol use 2.4 oz/week    4 Cans of beer per week     Comment: less than 1 drink daily  . Drug use: No  . Sexual activity: Yes   Other Topics Concern  . Not on file   Social History Narrative  . No narrative on file    REVIEW OF SYSTEMS: Constitutional: No fevers, chills, or sweats, no generalized fatigue, change in appetite Eyes: No visual changes, double vision, eye pain Ear, nose and throat: No hearing loss, ear pain, nasal congestion, sore throat Cardiovascular: No chest pain, palpitations Respiratory:  No shortness of breath at rest or with exertion, wheezes GastrointestinaI: No nausea, vomiting, diarrhea, abdominal pain, fecal incontinence Genitourinary:  No dysuria, urinary retention or frequency Musculoskeletal:  Neck pain Integumentary: No rash, pruritus, skin lesions Neurological: as above Psychiatric: No depression, insomnia, anxiety Endocrine: No palpitations, fatigue, diaphoresis, mood swings, change in appetite, change in weight, increased thirst Hematologic/Lymphatic:  No purpura, petechiae. Allergic/Immunologic: no itchy/runny eyes, nasal congestion, recent allergic reactions, rashes  PHYSICAL EXAM: Vitals:   03/14/16 1109  BP: 138/86  Pulse: (!) 107   General: No acute distress.  Patient appears well-groomed.   Head:  Normocephalic/atraumatic Eyes:  Fundi examined but not visualized Neck: supple, no paraspinal tenderness, full range of motion Heart:  Regular rate and rhythm Lungs:  Clear to auscultation bilaterally Back: No paraspinal tenderness Neurological Exam: alert and oriented to person, place, and time. Attention span and concentration intact, recent and remote memory intact, fund of knowledge intact.  Speech fluent and not dysarthric, language intact.  CN II-XII  intact. Bulk and tone normal, muscle strength 5/5 throughout.  Sensation to light touch  intact.  Deep tendon reflexes 2+ throughout.  Finger to nose testing intact.  Gait normal  IMPRESSION: Cervicogenic headache Cervical spondylosis with stenosis  PLAN: Nortriptyline 48m at bedtime Treatment of neck pain with muscle relaxants and stretching exercises as prescribed by other doctors. Follow up in one year  15 minutes spent face to face with patient, over 50% spent discussing management.  AMetta Clines DO  CC:  JJill Alexanders MD

## 2016-03-14 NOTE — Telephone Encounter (Signed)
Is this okay to refill? 

## 2016-04-10 ENCOUNTER — Other Ambulatory Visit: Payer: Self-pay | Admitting: Family Medicine

## 2016-04-10 NOTE — Telephone Encounter (Signed)
Is this okay to refill? 

## 2016-04-10 NOTE — Telephone Encounter (Signed)
Called in [facility] per [facility] 

## 2016-04-10 NOTE — Telephone Encounter (Signed)
Okay 

## 2016-04-18 ENCOUNTER — Other Ambulatory Visit: Payer: Self-pay | Admitting: Family Medicine

## 2016-04-18 NOTE — Telephone Encounter (Signed)
Is this okay to refill? 

## 2016-04-30 ENCOUNTER — Other Ambulatory Visit: Payer: Self-pay | Admitting: Neurology

## 2016-05-14 ENCOUNTER — Other Ambulatory Visit: Payer: Self-pay | Admitting: Family Medicine

## 2016-05-14 NOTE — Telephone Encounter (Signed)
Called in [facility] per [facility] 

## 2016-05-14 NOTE — Telephone Encounter (Signed)
ok 

## 2016-05-14 NOTE — Telephone Encounter (Signed)
Is this okay to refill? 

## 2016-05-21 DIAGNOSIS — H52203 Unspecified astigmatism, bilateral: Secondary | ICD-10-CM | POA: Diagnosis not present

## 2016-05-21 DIAGNOSIS — H524 Presbyopia: Secondary | ICD-10-CM | POA: Diagnosis not present

## 2016-05-21 DIAGNOSIS — H5213 Myopia, bilateral: Secondary | ICD-10-CM | POA: Diagnosis not present

## 2016-05-22 DIAGNOSIS — M5412 Radiculopathy, cervical region: Secondary | ICD-10-CM | POA: Diagnosis not present

## 2016-06-01 DIAGNOSIS — D2272 Melanocytic nevi of left lower limb, including hip: Secondary | ICD-10-CM | POA: Diagnosis not present

## 2016-06-01 DIAGNOSIS — Z08 Encounter for follow-up examination after completed treatment for malignant neoplasm: Secondary | ICD-10-CM | POA: Diagnosis not present

## 2016-06-01 DIAGNOSIS — Z85820 Personal history of malignant melanoma of skin: Secondary | ICD-10-CM | POA: Diagnosis not present

## 2016-06-01 DIAGNOSIS — D485 Neoplasm of uncertain behavior of skin: Secondary | ICD-10-CM | POA: Diagnosis not present

## 2016-06-01 DIAGNOSIS — C44219 Basal cell carcinoma of skin of left ear and external auricular canal: Secondary | ICD-10-CM | POA: Diagnosis not present

## 2016-06-01 DIAGNOSIS — Z1283 Encounter for screening for malignant neoplasm of skin: Secondary | ICD-10-CM | POA: Diagnosis not present

## 2016-06-28 ENCOUNTER — Other Ambulatory Visit: Payer: Self-pay | Admitting: Family Medicine

## 2016-06-28 NOTE — Telephone Encounter (Signed)
Called into pharmacy

## 2016-06-28 NOTE — Telephone Encounter (Signed)
Is this okay to refill? 

## 2016-06-28 NOTE — Telephone Encounter (Signed)
ok 

## 2016-07-18 DIAGNOSIS — D485 Neoplasm of uncertain behavior of skin: Secondary | ICD-10-CM | POA: Diagnosis not present

## 2016-07-18 DIAGNOSIS — Z85828 Personal history of other malignant neoplasm of skin: Secondary | ICD-10-CM | POA: Diagnosis not present

## 2016-07-18 DIAGNOSIS — Z08 Encounter for follow-up examination after completed treatment for malignant neoplasm: Secondary | ICD-10-CM | POA: Diagnosis not present

## 2016-07-23 ENCOUNTER — Other Ambulatory Visit: Payer: Self-pay | Admitting: Family Medicine

## 2016-07-23 NOTE — Telephone Encounter (Signed)
ok 

## 2016-07-23 NOTE — Telephone Encounter (Signed)
Is this okay to refill? 

## 2016-07-23 NOTE — Telephone Encounter (Signed)
Called in [facility] per jcl

## 2016-09-06 ENCOUNTER — Encounter: Payer: Self-pay | Admitting: Family Medicine

## 2016-09-06 ENCOUNTER — Ambulatory Visit (INDEPENDENT_AMBULATORY_CARE_PROVIDER_SITE_OTHER): Payer: 59 | Admitting: Family Medicine

## 2016-09-06 VITALS — BP 110/60 | HR 100 | Temp 98.7°F | Wt 184.0 lb

## 2016-09-06 DIAGNOSIS — J209 Acute bronchitis, unspecified: Secondary | ICD-10-CM

## 2016-09-06 DIAGNOSIS — J301 Allergic rhinitis due to pollen: Secondary | ICD-10-CM | POA: Diagnosis not present

## 2016-09-06 MED ORDER — AMOXICILLIN 875 MG PO TABS: 875.0000 mg | ORAL_TABLET | Freq: Two times a day (BID) | ORAL | 0 refills | Status: DC

## 2016-09-06 NOTE — Progress Notes (Signed)
   Subjective:    [name], male    DOB: January 30, 1956, 61 y.o.   MRN: [id number]  HPI He complains of an intermittent three-week history of coughing and now has developed some slight sore throat, PND, some nasal congestion with sneezing. His have seasonal allergies but rarely takes anything for them. He does not smoke.   Review of Systems     Objective:   Physical Exam Alert and in no distress. He is a mucosa is normal with nontender sinuses Tympanic membranes and canals are normal. Pharyngeal area is normal. Neck is supple without adenopathy or thyromegaly. Cardiac exam shows a regular sinus rhythm without murmurs or gallops. Lungs are clear to auscultation.        Assessment & Plan:  Acute bronchitis, unspecified organism - Plan: amoxicillin (AMOXIL) 875 MG tablet  Seasonal allergic rhinitis due to pollen Will call if not entirely better when he finishes the antibiotic. No therapy for his allergies since there not really causing much difficulty.

## 2016-09-20 ENCOUNTER — Other Ambulatory Visit: Payer: Self-pay | Admitting: Neurology

## 2016-10-01 ENCOUNTER — Other Ambulatory Visit: Payer: Self-pay | Admitting: Family Medicine

## 2016-10-01 NOTE — Telephone Encounter (Signed)
ok 

## 2016-10-01 NOTE — Telephone Encounter (Signed)
Is this okay to refill? 

## 2016-10-02 NOTE — Telephone Encounter (Signed)
Called Soma in per [employer]

## 2016-10-21 ENCOUNTER — Other Ambulatory Visit: Payer: Self-pay | Admitting: Family Medicine

## 2016-10-22 NOTE — Telephone Encounter (Signed)
Is this okay to refill? 

## 2016-11-01 ENCOUNTER — Other Ambulatory Visit: Payer: Self-pay | Admitting: Family Medicine

## 2016-11-01 NOTE — Telephone Encounter (Signed)
Called in med per [employer]

## 2016-11-01 NOTE — Telephone Encounter (Signed)
Is this okay to refill? 

## 2016-11-01 NOTE — Telephone Encounter (Signed)
ok 

## 2016-12-13 ENCOUNTER — Other Ambulatory Visit: Payer: Self-pay | Admitting: Family Medicine

## 2016-12-13 NOTE — Telephone Encounter (Signed)
Called in [place]. Joseph [name]

## 2017-02-13 ENCOUNTER — Ambulatory Visit: Payer: 59 | Admitting: Family Medicine

## 2017-02-13 ENCOUNTER — Encounter: Payer: Self-pay | Admitting: Family Medicine

## 2017-02-13 VITALS — BP 110/68 | HR 91 | Temp 98.1°F | Wt 180.0 lb

## 2017-02-13 DIAGNOSIS — R058 Other specified cough: Secondary | ICD-10-CM

## 2017-02-13 DIAGNOSIS — R05 Cough: Secondary | ICD-10-CM

## 2017-02-13 DIAGNOSIS — J069 Acute upper respiratory infection, unspecified: Secondary | ICD-10-CM

## 2017-02-13 MED ORDER — AMOXICILLIN 875 MG PO TABS: 875.0000 mg | ORAL_TABLET | Freq: Two times a day (BID) | ORAL | 0 refills | Status: DC

## 2017-02-13 NOTE — Progress Notes (Signed)
Subjective:  Joseph [name] is a 61 y.o. male who is here for a nurse visit for a flu shot and reported to the nurse that he started feeling poorly yesterday and requested to be seen. He presents for a 2 day history of sneezing, hoarse voice, post nasal drainage, cough that is productive of yellowish sputum for the past 24 hours. Felt lightheaded yesterday.   Denies fever, chills, headache, ear pain, sinus pressure, chest pain, palpitations, shortness of breath, wheezing, abdominal pain, N/V/D, LE edema.   Last antibiotic was in June for acute bronchitis.  History of pneumonia per patient. Does not smoke. He is immunocompetent.   Treatment to date: none.  Denies sick contacts.  No other aggravating or relieving factors.  No other c/o.  ROS as in subjective.   Objective: Vitals:   02/13/17 1147  BP: 110/68  Pulse: 91  Temp: 98.1 F (36.7 C)  SpO2: 97%    General appearance: Alert, WD/WN, no distress, mildly ill appearing                             Skin: warm, no rash                           Head: no sinus tenderness                            Eyes: conjunctiva normal, corneas clear, PERRLA                            Ears: pearly TMs, external ear canals normal                          Nose: septum midline, turbinates swollen, with erythema and clear discharge             Mouth/throat: MMM, tongue normal, mild pharyngeal erythema                           Neck: supple, no adenopathy, no thyromegaly, nontender                          Heart: RRR, normal S1, S2, no murmurs                         Lungs: CTA bilaterally, no wheezes, rales, or rhonchi      Assessment: Acute URI  Cough productive of yellow sputum  Plan: Discussed diagnosis and treatment of URI.  Suggested symptomatic OTC remedies. Amoxil sent to pharmacy due to the long holiday weekend with instructions to hold off on starting the antibiotic.  Nasal saline spray for congestion.  Mucinex for cough. Salt water  gargles for throat pain. Tylenol or Ibuprofen OTC for fever and malaise.  He may start the antibiotic if symptoms worsen or if he is not improving over the next several days. Advised to follow up if he takes the antibiotic and is not back to baseline after completing it.

## 2017-02-13 NOTE — Patient Instructions (Addendum)
I suspect your symptoms are related to a viral illness and recommend treating your symptoms at this point.  Mucinex for congestion and cough, drink extra water, use salt water gargles for throat irritation and Tylenol or Ibuprofen for aches and pains.  I am sending in an antibiotic for you only because this is a long holiday weekend.  Hold off on the antibiotic for now and give this 2-3 more days to see if you improve on your own. If you develop fever or worsening symptoms then you may start the antibiotic.   Return for a flu shot when you feel better.

## 2017-02-22 ENCOUNTER — Other Ambulatory Visit: Payer: Self-pay | Admitting: Neurology

## 2017-02-24 ENCOUNTER — Other Ambulatory Visit: Payer: Self-pay | Admitting: Family Medicine

## 2017-02-26 DIAGNOSIS — M9981 Other biomechanical lesions of cervical region: Secondary | ICD-10-CM | POA: Diagnosis not present

## 2017-02-26 DIAGNOSIS — M5412 Radiculopathy, cervical region: Secondary | ICD-10-CM | POA: Diagnosis not present

## 2017-02-26 DIAGNOSIS — M542 Cervicalgia: Secondary | ICD-10-CM | POA: Diagnosis not present

## 2017-03-06 ENCOUNTER — Other Ambulatory Visit: Payer: Self-pay | Admitting: Neurology

## 2017-03-14 ENCOUNTER — Ambulatory Visit: Payer: 59 | Admitting: Neurology

## 2017-03-14 ENCOUNTER — Encounter: Payer: Self-pay | Admitting: Neurology

## 2017-03-14 VITALS — BP 122/82 | HR 66 | Ht 77.0 in | Wt 187.4 lb

## 2017-03-14 DIAGNOSIS — M47812 Spondylosis without myelopathy or radiculopathy, cervical region: Secondary | ICD-10-CM

## 2017-03-14 DIAGNOSIS — I499 Cardiac arrhythmia, unspecified: Secondary | ICD-10-CM | POA: Diagnosis not present

## 2017-03-14 DIAGNOSIS — G4486 Cervicogenic headache: Secondary | ICD-10-CM

## 2017-03-14 DIAGNOSIS — R51 Headache: Secondary | ICD-10-CM

## 2017-03-14 NOTE — Patient Instructions (Signed)
1.  Continue nortriptyline 50mg  at bedtime. 2.  I want you to follow up with Dr. Redmond School about irregular heart beat.  I will send him a message as well.  This should be evaluated and I wouldn't want to increase nortriptyline until this is evaluated and okay to continue. 3.  Follow up with Dr. Brien Few regarding neck pain 4.  Follow up with me in one year or as needed.

## 2017-03-14 NOTE — Progress Notes (Signed)
NEUROLOGY FOLLOW UP OFFICE NOTE  Joseph [name] [id number]  HISTORY OF PRESENT ILLNESS: Joseph [name] is a 61 year old right-handed male who follows up for cervicogenic headache with cervical spondylosis.     UPDATE: For headache, he is taking nortriptyline 69m at bedtime.  For neck pain, he takes cyclobenzaprine, hydrocodone, and Soma.  He uses a traction unit on the neck.  He very rarely has headache.  Recently, he has had a flareup of neck pain, which was treated conservatively with a steroid taper.     HISTORY: In August, he began experiencing bilateral posterior neck pain.  It radiates up to the upper cervical region and down to between the shoulder blades.  He describes it as an aching pain.  About 3 days a week, it is accompanied by a headache in a band-like distribution.  He also reports burning sensation at the C7 spinal process.  He reports that a week prior to onset, he performed manual labor in the yard, such as chopping wood and pushing a wheel barrel of dirt.  He has undergone 5 weeks of PT with moderate relief.  The neck pain seems a little worse after a session.  Sometimes he notes numbness in the left arm and hand.  He has tried tramadol and Soma which were ineffective.  He was recently prescribed cyclobenzaparine and hydrocodone.   Plain films of cervical spine demonstrated multilevel degenerative disc disease with multiple osseous neural foraminal narrowing, most pronounced at C4-5, C5-6 and C6-7.  Craniocervical junctuion was unremarkable.  MRI of cervical spine from 01/14/15.  It reveals multilevel spondylosis moderate left C3-4 foraminal stenosis, moderate to severe bilateral C5-6 foraminal narrowing and severe left and moderate right C6-7 foraminal stenosis.  There is mild central stenosis with moderate left central stenosis at C6-7.    He was evaluated by spine surgeon who sent him to [name] Fewof pain management.  He received an epidural which was helpful  PAST  MEDICAL HISTORY: Past Medical History:  Diagnosis Date  . Alopecia   . DDD (degenerative disc disease)   . Diverticulosis   . ED (erectile dysfunction)   . GERD (gastroesophageal reflux disease)   . Melanoma ([facility]    sees Dr. HNevada Crane  back and chest  . Renal stone     MEDICATIONS: Current Outpatient Medications on File Prior to Visit  Medication Sig Dispense Refill  . amoxicillin (AMOXIL) 875 MG tablet Take 1 tablet (875 mg total) by mouth 2 (two) times daily. 20 tablet 0  . carisoprodol (SOMA) 350 MG tablet TAKE 1 TABLET BY MOUTH 3 TIMES DAILY AS NEEDED FOR SPASMS 30 tablet 5  . celecoxib (CELEBREX) 200 MG capsule TAKE ONE CAPSULE BY MOUTH TWICE A DAY 60 capsule 5  . cholecalciferol (VITAMIN D) 1000 units tablet Take 1,000 Units by mouth daily.    . cyclobenzaprine (FLEXERIL) 10 MG tablet Take 10 mg by mouth 2 (two) times daily as needed for muscle spasms.    . finasteride (PROPECIA) 1 MG tablet Take 1 mg by mouth daily. 1/4 TABLET DAILY    . glucosamine-chondroitin 500-400 MG tablet Take 1 tablet by mouth 3 (three) times daily.    .Marland KitchenHYDROcodone-acetaminophen (NORCO/VICODIN) 5-325 MG tablet Take 1 tablet by mouth every 6 (six) hours as needed. 8 tablet 0  . Krill Oil 1000 MG CAPS Take by mouth.    . Multiple Vitamins-Minerals (MULTIVITAMIN WITH MINERALS) tablet Take 1 tablet by mouth daily.      .Marland Kitchen  nortriptyline (PAMELOR) 50 MG capsule TAKE 1 CAPSULE BY MOUTH EVERYDAY AT BEDTIME 30 capsule 1  . tadalafil (CIALIS) 20 MG tablet Take 1 tablet (20 mg total) by mouth daily as needed for erectile dysfunction. 10 tablet 11  . vitamin E 100 UNIT capsule Take by mouth daily.    Marland Kitchen zolpidem (AMBIEN) 10 MG tablet TAKE 1 TABLET AT BEDTIME AS NEEDED FOR SLEEP 30 tablet 2   No current facility-administered medications on file prior to visit.     ALLERGIES: Allergies  Allergen Reactions  . Daypro [Oxaprozin]     GASTRIC PROBLEMS  . Methadone Itching    FAMILY HISTORY: Family History    Problem Relation Age of Onset  . Transient ischemic attack Father   . Multiple myeloma Father   . Bladder Cancer Father   . Celiac disease Mother   . Scoliosis Mother   . Colon cancer Neg Hx   . Esophageal cancer Neg Hx   . Liver disease Neg Hx   . Kidney disease Neg Hx   . Colon polyps Neg Hx     SOCIAL HISTORY: Social History   Socioeconomic History  . Marital status: Married    Spouse name: Not on file  . Number of children: Not on file  . Years of education: Not on file  . Highest education level: Not on file  Social Needs  . Financial resource strain: Not on file  . Food insecurity - worry: Not on file  . Food insecurity - inability: Not on file  . Transportation needs - medical: Not on file  . Transportation needs - non-medical: Not on file  Occupational History  . Not on file  Tobacco Use  . Smoking status: Former Smoker    Packs/day: 1.00    Last attempt to quit: 04/25/1994    Years since quitting: 22.9  . Smokeless tobacco: Never Used  Substance and Sexual Activity  . Alcohol use: Yes    Alcohol/week: 2.4 oz    Types: 4 Cans of beer per week    Comment: less than 1 drink daily  . Drug use: No  . Sexual activity: Yes  Other Topics Concern  . Not on file  Social History Narrative  . Not on file    REVIEW OF SYSTEMS: Constitutional: No fevers, chills, or sweats, no generalized fatigue, change in appetite Eyes: No visual changes, double vision, eye pain Ear, nose and throat: No hearing loss, ear pain, nasal congestion, sore throat Cardiovascular: No chest pain, palpitations Respiratory:  No shortness of breath at rest or with exertion, wheezes GastrointestinaI: No nausea, vomiting, diarrhea, abdominal pain, fecal incontinence Genitourinary:  No dysuria, urinary retention or frequency Musculoskeletal:  No neck pain, back pain Integumentary: No rash, pruritus, skin lesions Neurological: as above Psychiatric: No depression, insomnia, anxiety Endocrine:  No palpitations, fatigue, diaphoresis, mood swings, change in appetite, change in weight, increased thirst Hematologic/Lymphatic:  No purpura, petechiae. Allergic/Immunologic: no itchy/runny eyes, nasal congestion, recent allergic reactions, rashes  PHYSICAL EXAM: Vitals:   03/14/17 1142  BP: 122/82  Pulse: 66  SpO2: 98%   General: No acute distress.  Patient appears well-groomed.   Head:  Normocephalic/atraumatic Eyes:  Fundi examined but not visualized Neck: supple, no paraspinal tenderness, full range of motion Heart:  Regular rate, irregular rhythm Lungs:  Clear to auscultation bilaterally Back: No paraspinal tenderness Neurological Exam: alert and oriented to person, place, and time. Attention span and concentration intact, recent and remote memory intact, fund of knowledge intact.  Speech fluent and not dysarthric, language intact.  CN II-XII intact. Bulk and tone normal, muscle strength 5/5 throughout.  Sensation to light touch  intact.  Deep tendon reflexes 2+ throughout.  Finger to nose testing intact.  Gait normal, Romberg negative.  IMPRESSION: 1.  Cervicogenic headache 2.  Cervical spondylosis 3.  Irregular heartbeat.  Irregular heart rhythm is noted on exam.  Review of prior medical notes makes no mention of this.  He denies lightheadedness but reports that he sometimes feels a flutter.   PLAN: 1.  Most importantly, I advised him to contact Dr. Lanice Shirts office for further evaluation of his heart.   2.  He will continue nortriptyline 73m at bedtime.  We discussed increasing dose to help with cervical nerve pain.  However, given his cardiac symptoms, I do not want to make any increase at this time.  If he is cleared from a cardiac standpoint, then we can discuss increasing nortriptyline or consider gabapentin. 3.  Neck pain should continue being treated by [name] Few 4.  Follow up in one year or as needed.  19 minutes spent face to face with patient, over 50% spent  discussing management and exam findings.   AMetta Clines DO  CC:  JJill Alexanders MD

## 2017-03-20 ENCOUNTER — Ambulatory Visit: Payer: 59 | Admitting: Family Medicine

## 2017-03-20 VITALS — BP 128/82 | HR 102 | Ht 77.0 in | Wt 182.2 lb

## 2017-03-20 DIAGNOSIS — Z23 Encounter for immunization: Secondary | ICD-10-CM

## 2017-03-20 DIAGNOSIS — I491 Atrial premature depolarization: Secondary | ICD-10-CM | POA: Diagnosis not present

## 2017-03-20 NOTE — Progress Notes (Signed)
   Subjective:    [name], male    DOB: [birth year], 61 y.o.   MRN: [id number]  HPI He is here for consult concerning his heart rate.  Apparently he was recently had a neurology office and they noted his heart rate initially to be low and then it was recorded in the mid 60 range but irregular.  He has a previous history of PACs.  He has had no chest pain, shortness of breath, PND or DOE.   Review of Systems     Objective:   Physical Exam  Alert and in no distress.  Cardiac exam does show a tachycardia but regular rhythm and regular.       Assessment & Plan:  PAC (premature atrial contraction)  Need for influenza vaccination - Plan: Flu Vaccine QUAD 6+ mos PF IM (Fluarix Quad PF)  I reassured him that his heart rate right now is totally normal.  Demonstrated how to check his pulse and if he does note a low heart rate, faster than 150 heart rate, to let me know otherwise slight irregularity is not unusual with PACs.  Also discussed possible use of medications for this. Flu shot given.  Recommend he come back for Shingrix.  Presently we are out.

## 2017-04-03 DIAGNOSIS — I83813 Varicose veins of bilateral lower extremities with pain: Secondary | ICD-10-CM | POA: Diagnosis not present

## 2017-04-03 DIAGNOSIS — I83893 Varicose veins of bilateral lower extremities with other complications: Secondary | ICD-10-CM | POA: Diagnosis not present

## 2017-04-03 DIAGNOSIS — R600 Localized edema: Secondary | ICD-10-CM | POA: Diagnosis not present

## 2017-04-08 DIAGNOSIS — I83813 Varicose veins of bilateral lower extremities with pain: Secondary | ICD-10-CM | POA: Diagnosis not present

## 2017-04-15 ENCOUNTER — Other Ambulatory Visit: Payer: Self-pay | Admitting: Family Medicine

## 2017-04-15 NOTE — Telephone Encounter (Signed)
Can pt have a refill on meds  

## 2017-04-23 DIAGNOSIS — I8311 Varicose veins of right lower extremity with inflammation: Secondary | ICD-10-CM | POA: Diagnosis not present

## 2017-04-23 DIAGNOSIS — I83893 Varicose veins of bilateral lower extremities with other complications: Secondary | ICD-10-CM | POA: Diagnosis not present

## 2017-04-23 DIAGNOSIS — I8312 Varicose veins of left lower extremity with inflammation: Secondary | ICD-10-CM | POA: Diagnosis not present

## 2017-05-14 ENCOUNTER — Other Ambulatory Visit: Payer: Self-pay | Admitting: Neurology

## 2017-06-15 ENCOUNTER — Other Ambulatory Visit: Payer: Self-pay | Admitting: Family Medicine

## 2017-06-17 NOTE — Telephone Encounter (Signed)
[facility] [address] [facility] on [facility]ien. Thanks [employer]

## 2017-07-09 ENCOUNTER — Telehealth: Payer: Self-pay

## 2017-07-09 ENCOUNTER — Other Ambulatory Visit: Payer: Self-pay | Admitting: Family Medicine

## 2017-07-09 NOTE — Telephone Encounter (Signed)
Called pt to schedule an appt with Dr. Redmond School. Pt did not answer LVM. [place]

## 2017-07-09 NOTE — Telephone Encounter (Signed)
[facility] [address] [facility]. Pt has not been in since dec of 2018 .  call was placed for him to call back and make an appt. [place]

## 2017-07-22 ENCOUNTER — Encounter: Payer: Self-pay | Admitting: Neurology

## 2017-07-24 DIAGNOSIS — I8311 Varicose veins of right lower extremity with inflammation: Secondary | ICD-10-CM | POA: Diagnosis not present

## 2017-08-08 DIAGNOSIS — I8312 Varicose veins of left lower extremity with inflammation: Secondary | ICD-10-CM | POA: Diagnosis not present

## 2017-08-22 DIAGNOSIS — M5412 Radiculopathy, cervical region: Secondary | ICD-10-CM | POA: Diagnosis not present

## 2017-09-02 ENCOUNTER — Ambulatory Visit: Payer: 59 | Admitting: Family Medicine

## 2017-09-02 ENCOUNTER — Telehealth: Payer: Self-pay | Admitting: Family Medicine

## 2017-09-02 ENCOUNTER — Encounter: Payer: Self-pay | Admitting: Family Medicine

## 2017-09-02 VITALS — BP 136/84 | HR 89 | Temp 97.7°F | Wt 181.0 lb

## 2017-09-02 DIAGNOSIS — I83813 Varicose veins of bilateral lower extremities with pain: Secondary | ICD-10-CM

## 2017-09-02 DIAGNOSIS — N5201 Erectile dysfunction due to arterial insufficiency: Secondary | ICD-10-CM | POA: Diagnosis not present

## 2017-09-02 DIAGNOSIS — Z125 Encounter for screening for malignant neoplasm of prostate: Secondary | ICD-10-CM | POA: Diagnosis not present

## 2017-09-02 DIAGNOSIS — R030 Elevated blood-pressure reading, without diagnosis of hypertension: Secondary | ICD-10-CM | POA: Diagnosis not present

## 2017-09-02 DIAGNOSIS — M545 Low back pain: Secondary | ICD-10-CM

## 2017-09-02 DIAGNOSIS — M5090 Cervical disc disorder, unspecified, unspecified cervical region: Secondary | ICD-10-CM | POA: Diagnosis not present

## 2017-09-02 DIAGNOSIS — Z87891 Personal history of nicotine dependence: Secondary | ICD-10-CM | POA: Diagnosis not present

## 2017-09-02 DIAGNOSIS — G4700 Insomnia, unspecified: Secondary | ICD-10-CM | POA: Diagnosis not present

## 2017-09-02 DIAGNOSIS — I491 Atrial premature depolarization: Secondary | ICD-10-CM | POA: Diagnosis not present

## 2017-09-02 DIAGNOSIS — K219 Gastro-esophageal reflux disease without esophagitis: Secondary | ICD-10-CM | POA: Diagnosis not present

## 2017-09-02 DIAGNOSIS — R7302 Impaired glucose tolerance (oral): Secondary | ICD-10-CM | POA: Diagnosis not present

## 2017-09-02 DIAGNOSIS — Z85820 Personal history of malignant melanoma of skin: Secondary | ICD-10-CM | POA: Diagnosis not present

## 2017-09-02 DIAGNOSIS — Z0000 Encounter for general adult medical examination without abnormal findings: Secondary | ICD-10-CM | POA: Diagnosis not present

## 2017-09-02 DIAGNOSIS — G8929 Other chronic pain: Secondary | ICD-10-CM

## 2017-09-02 NOTE — Progress Notes (Signed)
Subjective:    [name], male    DOB: 1955-07-15, 62 y.o.   MRN: [id number]  HPI He is here for complete examination.  He continues to be followed by dermatology for his melanoma.  He does have reflux and does occasionally use Zantac.  He has seen neurology for his cervicogenic headache and seems to be doing well on Neurontin.  He also has chronic back pain that requires muscle relaxers and NSAIDs on occasion.  He is being followed by vascular people for varicose veins and is in the process of getting them taken care of.  His ED is causing very little difficulty.  He does have a history of glucose intolerance.  He also has PACs but is not complaining of any palpitations.  He is a former smoker.  He does have shift work related sleep disturbance.  He sometimes works late into the evening. His family and social history as well as health maintenance and immunizations was reviewed.  Review of Systems  All other systems reviewed and are negative.      Objective:   Physical Exam BP 136/84 (BP Location: Left Arm, Patient Position: Sitting)   Pulse 89   Temp 97.7 F (36.5 C)   Wt 181 lb (82.1 kg)   SpO2 95%   BMI 21.46 kg/m   General Appearance:    Alert, cooperative, no distress, appears stated age  Head:    Normocephalic, without obvious abnormality, atraumatic  Eyes:    PERRL, conjunctiva/corneas clear, EOM's intact, fundi    benign  Ears:    Normal TM's and external ear canals  Nose:   Nares normal, mucosa normal, no drainage or sinus   tenderness  Throat:   Lips, mucosa, and tongue normal; teeth and gums normal  Neck:   Supple, no lymphadenopathy;  thyroid:  no   enlargement/tenderness/nodules; no carotid   bruit or JVD  Back:    Spine nontender, no curvature, ROM normal, no CVA     tenderness  Lungs:     Clear to auscultation bilaterally without wheezes, rales or     ronchi; respirations unlabored      Heart:    Regular rate and rhythm, S1 and S2 normal, no  murmur, rub   or gallop     Abdomen:     Soft, non-tender, nondistended, normoactive bowel sounds,    no masses, no hepatosplenomegaly  Genitalia:   Deferred  Rectal:   Deferred  Extremities:   No clubbing, cyanosis or edema  Pulses:   2+ and symmetric all extremities  Skin:   Skin color, texture, turgor normal, no rashes or lesions  Lymph nodes:   Cervical, supraclavicular, and axillary nodes normal  Neurologic:   CNII-XII intact, normal strength, sensation and gait; reflexes 2+ and symmetric throughout          Psych:   Normal mood, affect, hygiene and grooming.          Assessment & Plan:  Routine general medical examination at a health care facility - Plan: CBC with Differential/Platelet, Comprehensive metabolic panel, Lipid panel  Cervical disc disease  Chronic low back pain without sciatica, unspecified back pain laterality  Erectile dysfunction due to arterial insufficiency  Gastroesophageal reflux disease without esophagitis  Glucose intolerance (impaired glucose tolerance)  History of melanoma  Premature atrial contractions  Varicose veins of both lower extremities with pain  Former smoker  Insomnia, unspecified type He will continue on his present medication regimen.  He  seems to be doing well overall.  I will continue him on his sleep med on an as-needed basis.  He will continue to be followed by the vascular people for his varicose veins.  He is using appropriate amounts of Zantac.  I will follow-up on his glucose intolerance as well as his blood pressure.  Continue to be seen by dermatology.  Discussed the fact that it age 46 we will need to do ultrasound on his abdomen for AAA.  Discussed retirement but at this point he is not interested.

## 2017-09-02 NOTE — Telephone Encounter (Signed)
Dr. Redmond School wants patient to come back in one month to follow up on his bp  Pt has already left the office, left message for pt to call

## 2017-09-03 LAB — COMPREHENSIVE METABOLIC PANEL
ALT: 24 IU/L (ref 0–44)
AST: 29 IU/L (ref 0–40)
Albumin/Globulin Ratio: 1.8 (ref 1.2–2.2)
Albumin: 4.3 g/dL (ref 3.6–4.8)
Alkaline Phosphatase: 90 IU/L (ref 39–117)
BUN/Creatinine Ratio: 10 (ref 10–24)
BUN: 11 mg/dL (ref 8–27)
Bilirubin Total: 0.3 mg/dL (ref 0.0–1.2)
CO2: 23 mmol/L (ref 20–29)
Calcium: 9 mg/dL (ref 8.6–10.2)
Chloride: 97 mmol/L (ref 96–106)
Creatinine, Ser: 1.08 mg/dL (ref 0.76–1.27)
GFR calc Af Amer: 84 mL/min/{1.73_m2} (ref >59)
GFR calc non Af Amer: 73 mL/min/{1.73_m2} (ref >59)
Globulin, Total: 2.4 g/dL (ref 1.5–4.5)
Glucose: 90 mg/dL (ref 65–99)
Potassium: 4.2 mmol/L (ref 3.5–5.2)
Sodium: 134 mmol/L (ref 134–144)
Total Protein: 6.7 g/dL (ref 6.0–8.5)

## 2017-09-03 LAB — CBC WITH DIFFERENTIAL/PLATELET
Basophils Absolute: 0 10*3/uL (ref 0.0–0.2)
Basos: 1 %
EOS (ABSOLUTE): 0.2 10*3/uL (ref 0.0–0.4)
Eos: 4 %
Hematocrit: 38.4 % (ref 37.5–51.0)
Hemoglobin: 12.4 g/dL — ABNORMAL LOW (ref 13.0–17.7)
Immature Grans (Abs): 0 10*3/uL (ref 0.0–0.1)
Immature Granulocytes: 0 %
Lymphocytes Absolute: 1.4 10*3/uL (ref 0.7–3.1)
Lymphs: 30 %
MCH: 29.4 pg (ref 26.6–33.0)
MCHC: 32.3 g/dL (ref 31.5–35.7)
MCV: 91 fL (ref 79–97)
Monocytes Absolute: 0.6 10*3/uL (ref 0.1–0.9)
Monocytes: 14 %
Neutrophils Absolute: 2.3 10*3/uL (ref 1.4–7.0)
Neutrophils: 51 %
Platelets: 410 10*3/uL (ref 150–450)
RBC: 4.22 x10E6/uL (ref 4.14–5.80)
RDW: 13.4 % (ref 12.3–15.4)
WBC: 4.4 10*3/uL (ref 3.4–10.8)

## 2017-09-03 LAB — HEMOGLOBIN A1C
Est. average glucose Bld gHb Est-mCnc: 117 mg/dL
Hgb A1c MFr Bld: 5.7 % — ABNORMAL HIGH (ref 4.8–5.6)

## 2017-09-03 LAB — LIPID PANEL
Chol/HDL Ratio: 3.9 ratio (ref 0.0–5.0)
Cholesterol, Total: 185 mg/dL (ref 100–199)
HDL: 48 mg/dL (ref >39)
LDL Calculated: 118 mg/dL — ABNORMAL HIGH (ref 0–99)
Triglycerides: 93 mg/dL (ref 0–149)
VLDL Cholesterol Cal: 19 mg/dL (ref 5–40)

## 2017-09-03 LAB — PSA: Prostate Specific Ag, Serum: 1.8 ng/mL (ref 0.0–4.0)

## 2017-09-05 DIAGNOSIS — I83892 Varicose veins of left lower extremities with other complications: Secondary | ICD-10-CM | POA: Diagnosis not present

## 2017-09-05 DIAGNOSIS — I8312 Varicose veins of left lower extremity with inflammation: Secondary | ICD-10-CM | POA: Diagnosis not present

## 2017-09-19 DIAGNOSIS — I83811 Varicose veins of right lower extremities with pain: Secondary | ICD-10-CM | POA: Diagnosis not present

## 2017-09-19 DIAGNOSIS — I8311 Varicose veins of right lower extremity with inflammation: Secondary | ICD-10-CM | POA: Diagnosis not present

## 2017-09-27 ENCOUNTER — Telehealth: Payer: Self-pay | Admitting: Internal Medicine

## 2017-09-27 NOTE — Telephone Encounter (Signed)
Called pt to find out if he wanted to get his 1st shingrix shot on Tuesday at his f/u appt. 2 shots saved in refrig

## 2017-10-01 ENCOUNTER — Ambulatory Visit: Payer: 59 | Admitting: Family Medicine

## 2017-10-02 DIAGNOSIS — Z85820 Personal history of malignant melanoma of skin: Secondary | ICD-10-CM | POA: Diagnosis not present

## 2017-10-02 DIAGNOSIS — Z1283 Encounter for screening for malignant neoplasm of skin: Secondary | ICD-10-CM | POA: Diagnosis not present

## 2017-10-02 DIAGNOSIS — C4441 Basal cell carcinoma of skin of scalp and neck: Secondary | ICD-10-CM | POA: Diagnosis not present

## 2017-10-02 DIAGNOSIS — L570 Actinic keratosis: Secondary | ICD-10-CM | POA: Diagnosis not present

## 2017-10-02 DIAGNOSIS — X32XXXD Exposure to sunlight, subsequent encounter: Secondary | ICD-10-CM | POA: Diagnosis not present

## 2017-10-02 DIAGNOSIS — Z08 Encounter for follow-up examination after completed treatment for malignant neoplasm: Secondary | ICD-10-CM | POA: Diagnosis not present

## 2017-10-04 DIAGNOSIS — I83811 Varicose veins of right lower extremities with pain: Secondary | ICD-10-CM | POA: Diagnosis not present

## 2017-10-04 DIAGNOSIS — I8311 Varicose veins of right lower extremity with inflammation: Secondary | ICD-10-CM | POA: Diagnosis not present

## 2017-10-18 ENCOUNTER — Ambulatory Visit: Payer: 59 | Admitting: Family Medicine

## 2017-10-18 ENCOUNTER — Encounter: Payer: Self-pay | Admitting: Family Medicine

## 2017-10-18 VITALS — BP 122/80 | HR 100 | Temp 97.9°F | Wt 183.8 lb

## 2017-10-18 DIAGNOSIS — N528 Other male erectile dysfunction: Secondary | ICD-10-CM | POA: Diagnosis not present

## 2017-10-18 DIAGNOSIS — R030 Elevated blood-pressure reading, without diagnosis of hypertension: Secondary | ICD-10-CM | POA: Diagnosis not present

## 2017-10-18 MED ORDER — TADALAFIL 20 MG PO TABS: 20.0000 mg | ORAL_TABLET | Freq: Every day | ORAL | 11 refills | Status: DC | PRN

## 2017-10-18 NOTE — Progress Notes (Signed)
   Subjective:    [name], male    DOB: 17-Sep-1955, 62 y.o.   MRN: [id number]  HPI He is here for recheck on his blood pressure.  On his last visit it was slightly elevated.  He also would like a refill on his Cialis.   Review of Systems     Objective:   Physical Exam Alert and in no distress.  Blood pressure is recorded.       Assessment & Plan:  Other male erectile dysfunction - Plan: tadalafil (CIALIS) 20 MG tablet  Elevated blood pressure reading I will continue to monitor his blood pressure but do not feel the need to pursue this any further.  We will also renew his Cialis.

## 2017-11-13 ENCOUNTER — Other Ambulatory Visit: Payer: Self-pay | Admitting: Neurology

## 2017-11-13 ENCOUNTER — Other Ambulatory Visit: Payer: Self-pay | Admitting: Family Medicine

## 2017-11-13 NOTE — Telephone Encounter (Signed)
[facility] celexa. Please advise. [place]

## 2017-11-20 DIAGNOSIS — Z08 Encounter for follow-up examination after completed treatment for malignant neoplasm: Secondary | ICD-10-CM | POA: Diagnosis not present

## 2017-11-20 DIAGNOSIS — Z85828 Personal history of other malignant neoplasm of skin: Secondary | ICD-10-CM | POA: Diagnosis not present

## 2017-12-10 ENCOUNTER — Other Ambulatory Visit: Payer: Self-pay | Admitting: Family Medicine

## 2017-12-10 NOTE — Telephone Encounter (Signed)
[facility] ambein. Please advise [facility]

## 2018-01-08 ENCOUNTER — Other Ambulatory Visit: Payer: Self-pay | Admitting: Family Medicine

## 2018-01-09 NOTE — Telephone Encounter (Signed)
[facility]ol. Please advise [facility]

## 2018-02-12 DIAGNOSIS — M5412 Radiculopathy, cervical region: Secondary | ICD-10-CM | POA: Diagnosis not present

## 2018-03-13 ENCOUNTER — Ambulatory Visit: Payer: 59 | Admitting: Neurology

## 2018-04-02 DIAGNOSIS — M5030 Other cervical disc degeneration, unspecified cervical region: Secondary | ICD-10-CM | POA: Diagnosis not present

## 2018-04-02 DIAGNOSIS — M48061 Spinal stenosis, lumbar region without neurogenic claudication: Secondary | ICD-10-CM | POA: Diagnosis not present

## 2018-04-22 ENCOUNTER — Other Ambulatory Visit: Payer: Self-pay | Admitting: Orthopedic Surgery

## 2018-04-22 DIAGNOSIS — M545 Low back pain, unspecified: Secondary | ICD-10-CM

## 2018-04-30 ENCOUNTER — Ambulatory Visit
Admission: RE | Admit: 2018-04-30 | Discharge: 2018-04-30 | Disposition: A | Discharge status: Home / Self Care | Payer: 59 | Source: Ambulatory Visit | Attending: Orthopedic Surgery | Admitting: Orthopedic Surgery

## 2018-04-30 DIAGNOSIS — M545 Low back pain, unspecified: Secondary | ICD-10-CM

## 2018-04-30 DIAGNOSIS — M48061 Spinal stenosis, lumbar region without neurogenic claudication: Secondary | ICD-10-CM | POA: Diagnosis not present

## 2018-05-04 ENCOUNTER — Other Ambulatory Visit: Payer: Self-pay | Admitting: Family Medicine

## 2018-05-05 NOTE — Telephone Encounter (Signed)
[facility] ambien and carisoprodol. Please advise kh

## 2018-05-11 ENCOUNTER — Other Ambulatory Visit: Payer: Self-pay | Admitting: Neurology

## 2018-05-13 ENCOUNTER — Other Ambulatory Visit: Payer: Self-pay | Admitting: Family Medicine

## 2018-05-13 NOTE — Telephone Encounter (Signed)
[facility] celebrex. Please advise kh

## 2018-05-20 DIAGNOSIS — M4722 Other spondylosis with radiculopathy, cervical region: Secondary | ICD-10-CM | POA: Diagnosis not present

## 2018-05-20 DIAGNOSIS — M48062 Spinal stenosis, lumbar region with neurogenic claudication: Secondary | ICD-10-CM | POA: Diagnosis not present

## 2018-05-20 DIAGNOSIS — M4150 Other secondary scoliosis, site unspecified: Secondary | ICD-10-CM | POA: Diagnosis not present

## 2018-05-28 ENCOUNTER — Other Ambulatory Visit: Payer: Self-pay | Admitting: Neurosurgery

## 2018-05-28 DIAGNOSIS — M4722 Other spondylosis with radiculopathy, cervical region: Secondary | ICD-10-CM

## 2018-05-28 NOTE — Progress Notes (Signed)
NEUROLOGY FOLLOW UP OFFICE NOTE  Joseph [name] [id number]  HISTORY OF PRESENT ILLNESS: Joseph [name] is a 63 year old right-handed man who follows up for cervical genic headache with cervical spondylosis.  UPDATE: For headache, he is taking nortriptyline 84m at bedtime. For neck pain, he takes cyclobenzaprine, hydrocodone, and Soma.  He uses a traction unit on the neck.  Moderate headaches are infrequent.  They last up to an hour and occur 2 to 3 days a month at the most.  He has had increased trouble in the back and balance issues.  He is seeing Dr. JArnoldo Moralewho told him MRI showed stenosis in the lumbar spine.  He is scheduled for a repeat MRI of cervical spine as well.  Last visit, he was noted to have irregular heart rhythm.  Worked up by cardiology.  Found to have PACs.  HISTORY: In August 2016, he began experiencing bilateral posterior neck pain radiating up to the upper cervical region and down to between the shoulder blades. He describes it as an aching pain. About 3 days a week, it is accompanied by a headache in a band-like distribution. He also reports burning sensation at the C7 spinal process. He reports that a week prior to onset, he performed manual labor in the yard, such as chopping wood and pushing a wheel barrel of dirt. He has undergone 5 weeks of PT with moderate relief. The neck pain seems a little worse after a session. Sometimes he notes numbness in the left arm and hand.  Plain films of cervical spine demonstrated multilevel degenerative disc disease with multiple osseous neural foraminal narrowing, most pronounced at C4-5, C5-6 and C6-7. Craniocervical junctuion was unremarkable. MRI of cervical spine from 01/14/15. It reveals multilevel spondylosis moderate left C3-4 foraminal stenosis, moderate to severe bilateral C5-6 foraminal narrowing and severe left and moderate right C6-7 foraminal stenosis. There is mild central stenosis with moderate left central  stenosis at C6-7.  He was evaluated by spine surgeon who sent him to [name] Fewof pain management.  He received an epidural which was helpful  PAST MEDICAL HISTORY: Past Medical History:  Diagnosis Date  . Alopecia   . DDD (degenerative disc disease)   . Diverticulosis   . ED (erectile dysfunction)   . GERD (gastroesophageal reflux disease)   . Melanoma ([facility]    sees Dr. HNevada Crane  back and chest  . Renal stone     MEDICATIONS: Current Outpatient Medications on File Prior to Visit  Medication Sig Dispense Refill  . amoxicillin (AMOXIL) 875 MG tablet Take 1 tablet (875 mg total) by mouth 2 (two) times daily. (Patient not taking: Reported on 03/20/2017) 20 tablet 0  . carisoprodol (SOMA) 350 MG tablet TAKE 1 TABLET BY MOUTH THREE TIMES A DAY AS NEEDED FOR SPASM 30 tablet 2  . celecoxib (CELEBREX) 200 MG capsule TAKE 1 CAPSULE BY MOUTH TWICE A DAY 60 capsule 5  . cholecalciferol (VITAMIN D) 1000 units tablet Take 1,000 Units by mouth daily.    . cyclobenzaprine (FLEXERIL) 10 MG tablet Take 10 mg by mouth 2 (two) times daily as needed for muscle spasms.    . finasteride (PROPECIA) 1 MG tablet Take 1 mg by mouth daily. 1/4 TABLET DAILY    . glucosamine-chondroitin 500-400 MG tablet Take 1 tablet by mouth 3 (three) times daily.    .Marland KitchenHYDROcodone-acetaminophen (NORCO/VICODIN) 5-325 MG tablet Take 1 tablet by mouth every 6 (six) hours as needed. 8 tablet 0  . Krill Oil 1000  MG CAPS Take by mouth.    . Multiple Vitamins-Minerals (MULTIVITAMIN WITH MINERALS) tablet Take 1 tablet by mouth daily.      . nortriptyline (PAMELOR) 50 MG capsule TAKE 1 CAPSULE BY MOUTH EVERYDAY AT BEDTIME 30 capsule 5  . tadalafil (CIALIS) 20 MG tablet Take 1 tablet (20 mg total) by mouth daily as needed for erectile dysfunction. 10 tablet 11  . TURMERIC PO Take by mouth.    . vitamin E 100 UNIT capsule Take by mouth daily.    Marland Kitchen zolpidem (AMBIEN) 10 MG tablet TAKE 1 TABLET BY MOUTH EVERY DAY AT BEDTIME AS NEEDED FOR  SLEEP 30 tablet 2   No current facility-administered medications on file prior to visit.     ALLERGIES: Allergies  Allergen Reactions  . Daypro [Oxaprozin]     GASTRIC PROBLEMS  . Methadone Itching    FAMILY HISTORY: Family History  Problem Relation Age of Onset  . Transient ischemic attack Father   . Multiple myeloma Father   . Bladder Cancer Father   . Celiac disease Mother   . Scoliosis Mother   . Colon cancer Neg Hx   . Esophageal cancer Neg Hx   . Liver disease Neg Hx   . Kidney disease Neg Hx   . Colon polyps Neg Hx    SOCIAL HISTORY: Social History   Socioeconomic History  . Marital status: Married    Spouse name: Not on file  . Number of children: Not on file  . Years of education: Not on file  . Highest education level: Not on file  Occupational History  . Not on file  Social Needs  . Financial resource strain: Not on file  . Food insecurity:    Worry: Not on file    Inability: Not on file  . Transportation needs:    Medical: Not on file    Non-medical: Not on file  Tobacco Use  . Smoking status: Former Smoker    Packs/day: 1.00    Last attempt to quit: 04/25/1994    Years since quitting: 24.1  . Smokeless tobacco: Never Used  Substance and Sexual Activity  . Alcohol use: Yes    Alcohol/week: 4.0 standard drinks    Types: 4 Cans of beer per week    Comment: less than 1 drink daily  . Drug use: No  . Sexual activity: Yes  Lifestyle  . Physical activity:    Days per week: Not on file    Minutes per session: Not on file  . Stress: Not on file  Relationships  . Social connections:    Talks on phone: Not on file    Gets together: Not on file    Attends religious service: Not on file    Active member of club or organization: Not on file    Attends meetings of clubs or organizations: Not on file    Relationship status: Not on file  . Intimate partner violence:    Fear of current or ex partner: Not on file    Emotionally abused: Not on file     Physically abused: Not on file    Forced sexual activity: Not on file  Other Topics Concern  . Not on file  Social History Narrative  . Not on file    REVIEW OF SYSTEMS: Constitutional: No fevers, chills, or sweats, no generalized fatigue, change in appetite Eyes: No visual changes, double vision, eye pain Ear, nose and throat: No hearing loss, ear pain, nasal congestion,  sore throat Cardiovascular: No chest pain, palpitations Respiratory:  No shortness of breath at rest or with exertion, wheezes GastrointestinaI: No nausea, vomiting, diarrhea, abdominal pain, fecal incontinence Genitourinary:  No dysuria, urinary retention or frequency Musculoskeletal:  Neck pain, back pain, right hip pain Integumentary: No rash, pruritus, skin lesions Neurological: as above Psychiatric: No depression, insomnia, anxiety Endocrine: No palpitations, fatigue, diaphoresis, mood swings, change in appetite, change in weight, increased thirst Hematologic/Lymphatic:  No purpura, petechiae. Allergic/Immunologic: no itchy/runny eyes, nasal congestion, recent allergic reactions, rashes  PHYSICAL EXAM: Blood pressure 134/76, pulse 94, height 6' 4"  (1.93 m), weight 189 lb (85.7 kg), SpO2 96 %. General: No acute distress.  Patient appears well-groomed.   Head:  Normocephalic/atraumatic Eyes:  Fundi examined but not visualized Neck: supple, paraspinal tenderness, full range of motion Heart:  Regular rate and rhythm Lungs:  Clear to auscultation bilaterally Back: paraspinal tenderness Neurological Exam: alert and oriented to person, place, and time. Attention span and concentration intact, recent and remote memory intact, fund of knowledge intact.  Speech fluent and not dysarthric, language intact.  CN II-XII intact. Bulk and tone normal, muscle strength 5/5 throughout.  Sensation to pinprick reduced on dorsum of both feet, reduced vibratory sensation in feet.  Deep tendon reflexes 2+ throughout, toes downgoing.   Finger to nose and heel to shin testing intact.  Right limp. Romberg negative.  IMPRESSION: Cervicogenic headache Degenerative disc disease and spondylosis of the cervical spine Chronic low back pain, followed by Dr. Arnoldo Morale  PLAN: 1.  Continue nortriptyline 76m at bedtime 2.  Follow up in one year or as needed.  AMetta Clines DO  CC: JJill Alexanders MD

## 2018-05-30 ENCOUNTER — Encounter: Payer: Self-pay | Admitting: Neurology

## 2018-05-30 ENCOUNTER — Ambulatory Visit: Payer: 59 | Admitting: Neurology

## 2018-05-30 ENCOUNTER — Encounter

## 2018-05-30 VITALS — BP 134/76 | HR 94 | Ht 76.0 in | Wt 189.0 lb

## 2018-05-30 DIAGNOSIS — G8929 Other chronic pain: Secondary | ICD-10-CM

## 2018-05-30 DIAGNOSIS — M545 Low back pain, unspecified: Secondary | ICD-10-CM

## 2018-05-30 DIAGNOSIS — M47812 Spondylosis without myelopathy or radiculopathy, cervical region: Secondary | ICD-10-CM

## 2018-05-30 DIAGNOSIS — G4486 Cervicogenic headache: Secondary | ICD-10-CM

## 2018-05-30 DIAGNOSIS — R51 Headache: Secondary | ICD-10-CM | POA: Diagnosis not present

## 2018-05-30 NOTE — Patient Instructions (Signed)
1.  Continue nortriptyline 50mg  at bedtime 2.  Follow up in one year

## 2018-06-11 ENCOUNTER — Other Ambulatory Visit: Payer: Self-pay

## 2018-06-11 ENCOUNTER — Ambulatory Visit
Admission: RE | Admit: 2018-06-11 | Discharge: 2018-06-11 | Disposition: A | Discharge status: Home / Self Care | Payer: 59 | Source: Ambulatory Visit | Attending: Neurosurgery | Admitting: Neurosurgery

## 2018-06-11 DIAGNOSIS — M4802 Spinal stenosis, cervical region: Secondary | ICD-10-CM | POA: Diagnosis not present

## 2018-06-11 DIAGNOSIS — M4722 Other spondylosis with radiculopathy, cervical region: Secondary | ICD-10-CM

## 2018-06-24 DIAGNOSIS — M4150 Other secondary scoliosis, site unspecified: Secondary | ICD-10-CM | POA: Diagnosis not present

## 2018-07-07 DIAGNOSIS — R29818 Other symptoms and signs involving the nervous system: Secondary | ICD-10-CM

## 2018-07-07 DIAGNOSIS — M48062 Spinal stenosis, lumbar region with neurogenic claudication: Secondary | ICD-10-CM | POA: Insufficient documentation

## 2018-07-07 DIAGNOSIS — G9519 Other vascular myelopathies: Secondary | ICD-10-CM

## 2018-07-07 DIAGNOSIS — M4150 Other secondary scoliosis, site unspecified: Secondary | ICD-10-CM

## 2018-07-07 DIAGNOSIS — M48061 Spinal stenosis, lumbar region without neurogenic claudication: Secondary | ICD-10-CM

## 2018-07-07 DIAGNOSIS — M4180 Other forms of scoliosis, site unspecified: Secondary | ICD-10-CM

## 2018-07-07 HISTORY — DX: Spinal stenosis, lumbar region with neurogenic claudication: M48.062

## 2018-07-07 HISTORY — DX: Other symptoms and signs involving the nervous system: R29.818

## 2018-07-07 HISTORY — DX: Other forms of scoliosis, site unspecified: M41.80

## 2018-07-07 HISTORY — DX: Other secondary scoliosis, site unspecified: M41.50

## 2018-07-07 HISTORY — DX: Spinal stenosis, lumbar region without neurogenic claudication: M48.061

## 2018-07-07 HISTORY — DX: Other vascular myelopathies: G95.19

## 2018-07-29 DIAGNOSIS — M545 Low back pain: Secondary | ICD-10-CM | POA: Diagnosis not present

## 2018-08-17 ENCOUNTER — Other Ambulatory Visit: Payer: Self-pay | Admitting: Family Medicine

## 2018-08-19 NOTE — Telephone Encounter (Signed)
[facility]em[facility] Please advise [facility] 

## 2018-10-07 ENCOUNTER — Other Ambulatory Visit: Payer: Self-pay | Admitting: Family Medicine

## 2018-10-07 NOTE — Telephone Encounter (Signed)
[facility] SOMA. Please advise . Kh

## 2018-11-13 ENCOUNTER — Other Ambulatory Visit: Payer: Self-pay | Admitting: Family Medicine

## 2018-11-13 ENCOUNTER — Other Ambulatory Visit: Payer: Self-pay | Admitting: Neurology

## 2018-11-13 NOTE — Telephone Encounter (Signed)
[facility] Celexa. Please advise [facility] 

## 2018-12-15 ENCOUNTER — Other Ambulatory Visit: Payer: Self-pay | Admitting: Family Medicine

## 2018-12-15 NOTE — Telephone Encounter (Signed)
[facility]em[facility] Please advise [facility] 

## 2019-02-25 ENCOUNTER — Other Ambulatory Visit: Payer: Self-pay | Admitting: Family Medicine

## 2019-02-25 NOTE — Telephone Encounter (Signed)
[facility] ambien. Please advise [facility] 

## 2019-05-07 ENCOUNTER — Other Ambulatory Visit: Payer: Self-pay | Admitting: Neurology

## 2019-05-07 ENCOUNTER — Other Ambulatory Visit: Payer: Self-pay | Admitting: Family Medicine

## 2019-05-07 NOTE — Telephone Encounter (Signed)
Ist this okay to refill?

## 2019-05-22 NOTE — Progress Notes (Signed)
Due to the COVID-19 crisis, this virtual check-in visit was done via telephone from my office and it was initiated and consent given by this patient and or family.   Telephone (Audio) Visit The purpose of this telephone visit is to provide medical care while limiting exposure to the novel coronavirus.    Consent was obtained for telephone visit and initiated by pt/family:  Yes.   Answered questions that patient had about telehealth interaction:  Yes.   I discussed the limitations, risks, security and privacy concerns of performing an evaluation and management service by telephone. I also discussed with the patient that there may be a patient responsible charge related to this service. The patient expressed understanding and agreed to proceed.  Pt location: Home Physician Location: office Name of referring provider:  Denita Lung, MD I connected with .Joseph [name] at patients initiation/request on 05/25/2019 at 10:50 AM EST by telephone and verified that I am speaking with the correct person using two identifiers.  Pt MRN:  [id number] Pt DOB:  [birth year]   History of Present Illness:  Joseph [name] is a 64 year old right-handed man who follows up for cervicogenic headache with cervical spondylosis.  UPDATE: For headache, he is taking nortriptyline 48m at bedtime.For neck pain, he takes cyclobenzaprine, hydrocodone, and Soma. He uses a traction unit on the neck. Moderate headaches are infrequent.  They last up to an hour and occur 2 to 3 days a month at the most.  He has had increased trouble in the back and balance issues.  He is seeing Dr. JArnoldo Morale  MRI of cervical spine from 06/11/2018 personally reviewed showed "1.  Left eccentric disc osteophyte with facet arthrosis at C3-4 with resultant moderate canal, with severe left and moderate right C4 foraminal stenosis.  2.  Disc osteophyte with left-sided facet hypertrophy at C4-5 with result moderate left C5 foraminal narrowing.  3.   Degenerative disc osteophyte with facet hypertrophy at C5-6 and C6-7 with resultant mild canal, with severe left worse than right C6 and C7 foraminal stenosis.  4.  Moderate bilateral C8 foraminal narrowing related to disc bulge and facet hypertrophy, similar to previous.  5.  Overall, multilevel degenerative changes have progressed relative to 2016."  Over the past month or two, he says he reports very brief disorientation while driving.  At an intersection, it may take a couple of seconds to get his bearings.  It really only occurs at night, not during the day.  Also, he may sometimes have word-finding difficulty or can't recall somebody's name right away.  He reports fatigue and has been working crazy hours.  HISTORY: In August 2016, he began experiencing bilateral posterior neck pain radiating up to the upper cervical region and down to between the shoulder blades.He describes it as an aching pain.About 3 days a week, it is accompanied by a headache in a band-like distribution.He also reports burning sensation at the C7 spinal process.He reports that a week prior to onset, he performed manual labor in the yard, such as chopping wood and pushing a wheel barrel of dirt.He has undergone 5 weeks of PT with moderate relief.The neck pain seems a little worse after a session.Sometimes he notes numbness in the left arm and hand.  Plain films of cervical spine demonstrated multilevel degenerative disc disease with multiple osseous neural foraminal narrowing, most pronounced at C4-5, C5-6 and C6-7.Craniocervical junctuion was unremarkable.MRI of cervical spine from 01/14/15.It reveals multilevel spondylosis moderate left C3-4 foraminal stenosis, moderate to severe bilateral  C5-6 foraminal narrowing and severe left and moderate right C6-7 foraminal stenosis.There is mild central stenosis with moderate left central stenosis at C6-7.Repeat MRI of cervical spine from 06/12/2018 showed progressed  multilevel degenerative changes with neural foraminal stenosis as well as moderate canal stenosis at C3-4.  He was evaluated by spine surgeon who sent him toDr. Brien Few ofpain management. He received an epidural which was helpful  Past Medical History: Past Medical History:  Diagnosis Date  . Alopecia   . DDD (degenerative disc disease)   . Degenerative scoliosis 07/07/2018  . Diverticulosis   . ED (erectile dysfunction)   . GERD (gastroesophageal reflux disease)   . Melanoma [facility])    sees Dr. [place] Crane.  back and chest  . Neurogenic claudication 07/07/2018  . Renal stone   . Spinal stenosis of lumbar region 07/07/2018    Medications: Outpatient Encounter Medications as of 05/25/2019  Medication Sig  . amoxicillin (AMOXIL) 875 MG tablet Take 1 tablet (875 mg total) by mouth 2 (two) times daily. (Patient not taking: Reported on 03/20/2017)  . carisoprodol (SOMA) 350 MG tablet TAKE 1 TABLET BY MOUTH THREE TIMES A DAY AS NEEDED FOR SPASM  . celecoxib (CELEBREX) 200 MG capsule TAKE 1 CAPSULE BY MOUTH TWICE A DAY  . cholecalciferol (VITAMIN D) 1000 units tablet Take 1,000 Units by mouth daily.  . cyclobenzaprine (FLEXERIL) 10 MG tablet Take 10 mg by mouth 2 (two) times daily as needed for muscle spasms.  . finasteride (PROPECIA) 1 MG tablet Take 1 mg by mouth daily. 1/4 TABLET DAILY  . glucosamine-chondroitin 500-400 MG tablet Take 1 tablet by mouth 3 (three) times daily.  Marland Kitchen HYDROcodone-acetaminophen (NORCO/VICODIN) 5-325 MG tablet Take 1 tablet by mouth every 6 (six) hours as needed.  Javier Docker Oil 1000 MG CAPS Take by mouth.  . Multiple Vitamins-Minerals (MULTIVITAMIN WITH MINERALS) tablet Take 1 tablet by mouth daily.    . nortriptyline (PAMELOR) 50 MG capsule TAKE 1 CAPSULE BY MOUTH EVERYDAY AT BEDTIME  . tadalafil (CIALIS) 20 MG tablet Take 1 tablet (20 mg total) by mouth daily as needed for erectile dysfunction.  . TURMERIC PO Take by mouth.  . vitamin E 100 UNIT capsule Take by mouth  daily.  Marland Kitchen zolpidem (AMBIEN) 10 MG tablet TAKE 1 TABLET BY MOUTH EVERY DAY AT BEDTIME AS NEEDED FOR SLEEP   No facility-administered encounter medications on file as of 05/25/2019.    Allergies: Allergies  Allergen Reactions  . Daypro [Oxaprozin]     GASTRIC PROBLEMS  . Methadone Itching    Family History: Family History  Problem Relation Age of Onset  . Transient ischemic attack Father   . Multiple myeloma Father   . Bladder Cancer Father   . Celiac disease Mother   . Scoliosis Mother   . Colon cancer Neg Hx   . Esophageal cancer Neg Hx   . Liver disease Neg Hx   . Kidney disease Neg Hx   . Colon polyps Neg Hx     Social History: Social History   Socioeconomic History  . Marital status: Married    Spouse name: Not on file  . Number of children: Not on file  . Years of education: Not on file  . Highest education level: Not on file  Occupational History  . Not on file  Tobacco Use  . Smoking status: Former Smoker    Packs/day: 1.00    Quit date: 04/25/1994    Years since quitting: 25.0  . Smokeless tobacco: Never  Used  Substance and Sexual Activity  . Alcohol use: Yes    Alcohol/week: 4.0 standard drinks    Types: 4 Cans of beer per week    Comment: less than 1 drink daily  . Drug use: No  . Sexual activity: Yes  Other Topics Concern  . Not on file  Social History Narrative  . Not on file   Social Determinants of Health   Financial Resource Strain:   . Difficulty of Paying Living Expenses: Not on file  Food Insecurity:   . Worried About Charity fundraiser in the Last Year: Not on file  . Ran Out of Food in the Last Year: Not on file  Transportation Needs:   . Lack of Transportation (Medical): Not on file  . Lack of Transportation (Non-Medical): Not on file  Physical Activity:   . Days of Exercise per Week: Not on file  . Minutes of Exercise per Session: Not on file  Stress:   . Feeling of Stress : Not on file  Social Connections:   . Frequency of  Communication with Friends and Family: Not on file  . Frequency of Social Gatherings with Friends and Family: Not on file  . Attends Religious Services: Not on file  . Active Member of Clubs or Organizations: Not on file  . Attends Archivist Meetings: Not on file  . Marital Status: Not on file  Intimate Partner Violence:   . Fear of Current or Ex-Partner: Not on file  . Emotionally Abused: Not on file  . Physically Abused: Not on file  . Sexually Abused: Not on file    Observations/Objective:   Height 6' 5"  (1.956 m), weight 182 lb (82.6 kg). No acute distress.  Alert and oriented.  Speech fluent and not dysarthric.  Language intact.  Eyes orthophoric on primary gaze.  Face symmetric.  Assessment and Plan:   1.  Cervicogenic headache 2.  Cervical spinal stenosis with degenerative disc disease and spondylosis 3.  Short-term memory concerns.  No clear red flags.  The very brief disorientation while driving may be related to vision as it tends to occur only at night.  Word-finding difficulty is also mild and may be age-related or related to fatigue.  1.  Nortriptyline 89m at bedtime 2.  Check B12 and TSH for memory deficits and fatigue.  Further recommendations pending results.  If symptoms get worse, consider neuropsych testing. 3.  Otherwise, follow up in one year.  Need for immediate in-office follow-up:  No.  Follow Up Instructions:    -I discussed the assessment and treatment plan with the patient. The patient was provided an opportunity to ask questions and all were answered. The patient agreed with the plan and demonstrated an understanding of the instructions.   The patient was advised to call back or seek an in-person evaluation if the symptoms worsen or if the condition fails to improve as anticipated.  Total time spent with patient:  9 minutes  .     ADudley Major DO

## 2019-05-25 ENCOUNTER — Encounter: Payer: Self-pay | Admitting: Neurology

## 2019-05-25 ENCOUNTER — Other Ambulatory Visit: Payer: Self-pay

## 2019-05-25 ENCOUNTER — Telehealth (INDEPENDENT_AMBULATORY_CARE_PROVIDER_SITE_OTHER): Payer: 59 | Admitting: Neurology

## 2019-05-25 VITALS — Ht 77.0 in | Wt 182.0 lb

## 2019-05-25 DIAGNOSIS — R413 Other amnesia: Secondary | ICD-10-CM | POA: Diagnosis not present

## 2019-05-25 DIAGNOSIS — R519 Headache, unspecified: Secondary | ICD-10-CM | POA: Diagnosis not present

## 2019-05-25 DIAGNOSIS — M47812 Spondylosis without myelopathy or radiculopathy, cervical region: Secondary | ICD-10-CM | POA: Diagnosis not present

## 2019-05-25 DIAGNOSIS — G4486 Cervicogenic headache: Secondary | ICD-10-CM

## 2019-05-27 ENCOUNTER — Other Ambulatory Visit: Payer: Self-pay

## 2019-05-27 DIAGNOSIS — R413 Other amnesia: Secondary | ICD-10-CM

## 2019-06-01 ENCOUNTER — Telehealth: Payer: Self-pay | Admitting: Neurology

## 2019-06-01 NOTE — Telephone Encounter (Signed)
Patient notified, he does not need to be fasting.

## 2019-06-01 NOTE — Telephone Encounter (Signed)
Patient is scheduled to have lab work tomorrow for Dr. Tomi Likens and he wanted to see if he needed to fast for the blood work? Please Advise. Thank you

## 2019-06-02 ENCOUNTER — Other Ambulatory Visit (INDEPENDENT_AMBULATORY_CARE_PROVIDER_SITE_OTHER): Payer: 59

## 2019-06-02 ENCOUNTER — Other Ambulatory Visit: Payer: Self-pay

## 2019-06-02 DIAGNOSIS — R413 Other amnesia: Secondary | ICD-10-CM | POA: Diagnosis not present

## 2019-06-02 LAB — TSH: TSH: 1.2 mIU/L (ref 0.40–4.50)

## 2019-06-02 LAB — VITAMIN B12: Vitamin B-12: 441 pg/mL (ref 200–1100)

## 2019-06-20 ENCOUNTER — Other Ambulatory Visit: Payer: Self-pay | Admitting: Family Medicine

## 2019-06-22 NOTE — Telephone Encounter (Signed)
Set him up for a med check appointment.  See if he has enough medication until his appointment

## 2019-06-22 NOTE — Telephone Encounter (Signed)
LVM or pt to call back and schedule a med check [facility]

## 2019-06-22 NOTE — Telephone Encounter (Signed)
[facility] Please advise [facility] °

## 2019-06-23 NOTE — Telephone Encounter (Signed)
Pt scheduled for med check on Monday and said he only has about 3 pills left. [facility] [address]

## 2019-06-29 ENCOUNTER — Ambulatory Visit: Payer: 59 | Admitting: Family Medicine

## 2019-06-29 ENCOUNTER — Encounter: Payer: Self-pay | Admitting: Family Medicine

## 2019-06-29 ENCOUNTER — Other Ambulatory Visit: Payer: Self-pay

## 2019-06-29 VITALS — BP 146/90 | HR 104 | Temp 98.9°F | Wt 187.2 lb

## 2019-06-29 DIAGNOSIS — G8929 Other chronic pain: Secondary | ICD-10-CM

## 2019-06-29 DIAGNOSIS — M4180 Other forms of scoliosis, site unspecified: Secondary | ICD-10-CM

## 2019-06-29 DIAGNOSIS — Z13220 Encounter for screening for lipoid disorders: Secondary | ICD-10-CM

## 2019-06-29 DIAGNOSIS — L570 Actinic keratosis: Secondary | ICD-10-CM

## 2019-06-29 DIAGNOSIS — M5090 Cervical disc disorder, unspecified, unspecified cervical region: Secondary | ICD-10-CM

## 2019-06-29 DIAGNOSIS — M545 Low back pain: Secondary | ICD-10-CM

## 2019-06-29 DIAGNOSIS — K219 Gastro-esophageal reflux disease without esophagitis: Secondary | ICD-10-CM | POA: Insufficient documentation

## 2019-06-29 DIAGNOSIS — I491 Atrial premature depolarization: Secondary | ICD-10-CM

## 2019-06-29 DIAGNOSIS — M48062 Spinal stenosis, lumbar region with neurogenic claudication: Secondary | ICD-10-CM

## 2019-06-29 DIAGNOSIS — G9519 Other vascular myelopathies: Secondary | ICD-10-CM

## 2019-06-29 DIAGNOSIS — M4150 Other secondary scoliosis, site unspecified: Secondary | ICD-10-CM

## 2019-06-29 DIAGNOSIS — R7302 Impaired glucose tolerance (oral): Secondary | ICD-10-CM

## 2019-06-29 NOTE — Patient Instructions (Addendum)
Lets stop Celebrex for your back and see how you do.  Hold off on taking Flexeril until you first try Tylenol and then go to Flexeril.  The Tylenol dosing would be 2 pills 4 times per day.  Do your stretching exercises when you get up, also after lunch breaks and even after work If you start having reflux symptoms again take Pepcid or Axid regularly for about a week to see if you can unblock it

## 2019-06-29 NOTE — Progress Notes (Signed)
   Subjective:    [name], male    DOB: [birth year], 64 y.o.   MRN: [id number]  HPI He is here for an interval evaluation.  He has a history of spinal stenosis with neurogenic claudication as well as cervicogenic difficulty and headaches.  He also has a previous history of degenerative scoliosis.  Presently he is using Celebrex as well as Flexeril and occasionally will also use a Soma compound he usually does not use the Soma and Flexeril at the same time.  He states that he is not sure whether the Celebrex is doing him any good.  He is also recently seen Dr. Tomi Likens for his cervicogenic headache and presently is taking nortriptyline which she says does indeed help.  He also has a history of melanoma and is now being seen on a yearly basis.  He does have several lesions on his face that he would like evaluated.  He also complains of reflux symptoms and states this usually bothers him on the weeks that he works.  He works 1 week off and 1 week off. He also has a history of glucose intolerance as well as PAC'S  Review of Systems     Objective:   Physical Exam Alert and in no distress. Tympanic membranes and canals are normal. Pharyngeal area is normal. Neck is supple without adenopathy or thyromegaly. Cardiac exam shows a regular sinus rhythm without murmurs or gallops. Lungs are clear to auscultation. Exam of his forehead does show several erythematous, dry scaly lesions.       Assessment & Plan:  Spinal stenosis of lumbar region with neurogenic claudication  Neurogenic claudication  Chronic bilateral low back pain without sciatica  Cervical disc disease  Degenerative scoliosis  Premature atrial contractions  Glucose intolerance (impaired glucose tolerance) - Plan: CBC with Differential/Platelet, Comprehensive metabolic panel  Gastroesophageal reflux disease without esophagitis - Plan: CBC with Differential/Platelet, Comprehensive metabolic panel  Screening for  lipid disorders - Plan: Lipid panel  Actinic keratosis Lets stop Celebrex for your back and see how you do.  Hold off on taking Flexeril until you first try Tylenol and then go to Flexeril.  The Tylenol dosing would be 2 pills 4 times per day.  Do your stretching exercises when you get up, also after lunch breaks and even after work If you start having reflux symptoms again take Pepcid or Axid regularly for about a week to see if you can unblock it Explained that I would also like to hold off on using Soma and only stick with 1 muscle relaxer Follow-up with your dermatologist concerning treatment for your actinic keratosis. No evidence of PACs at the present time. He is to call me in roughly 2 weeks to let me know how he is doing on this regimen.  He was comfortable with that.

## 2019-06-30 LAB — COMPREHENSIVE METABOLIC PANEL
ALT: 18 IU/L (ref 0–44)
AST: 25 IU/L (ref 0–40)
Albumin/Globulin Ratio: 2.1 (ref 1.2–2.2)
Albumin: 4.2 g/dL (ref 3.8–4.8)
Alkaline Phosphatase: 101 IU/L (ref 39–117)
BUN/Creatinine Ratio: 12 (ref 10–24)
BUN: 13 mg/dL (ref 8–27)
Bilirubin Total: <0.2 mg/dL (ref 0.0–1.2)
CO2: 24 mmol/L (ref 20–29)
Calcium: 9.6 mg/dL (ref 8.6–10.2)
Chloride: 102 mmol/L (ref 96–106)
Creatinine, Ser: 1.13 mg/dL (ref 0.76–1.27)
GFR calc Af Amer: 79 mL/min/{1.73_m2} (ref >59)
GFR calc non Af Amer: 68 mL/min/{1.73_m2} (ref >59)
Globulin, Total: 2 g/dL (ref 1.5–4.5)
Glucose: 69 mg/dL (ref 65–99)
Potassium: 3.9 mmol/L (ref 3.5–5.2)
Sodium: 137 mmol/L (ref 134–144)
Total Protein: 6.2 g/dL (ref 6.0–8.5)

## 2019-06-30 LAB — CBC WITH DIFFERENTIAL/PLATELET
Basophils Absolute: 0.1 10*3/uL (ref 0.0–0.2)
Basos: 1 %
EOS (ABSOLUTE): 0.2 10*3/uL (ref 0.0–0.4)
Eos: 3 %
Hematocrit: 35.4 % — ABNORMAL LOW (ref 37.5–51.0)
Hemoglobin: 11.6 g/dL — ABNORMAL LOW (ref 13.0–17.7)
Immature Grans (Abs): 0 10*3/uL (ref 0.0–0.1)
Immature Granulocytes: 0 %
Lymphocytes Absolute: 1.6 10*3/uL (ref 0.7–3.1)
Lymphs: 29 %
MCH: 29.1 pg (ref 26.6–33.0)
MCHC: 32.8 g/dL (ref 31.5–35.7)
MCV: 89 fL (ref 79–97)
Monocytes Absolute: 0.8 10*3/uL (ref 0.1–0.9)
Monocytes: 15 %
Neutrophils Absolute: 2.9 10*3/uL (ref 1.4–7.0)
Neutrophils: 52 %
Platelets: 361 10*3/uL (ref 150–450)
RBC: 3.99 x10E6/uL — ABNORMAL LOW (ref 4.14–5.80)
RDW: 12.7 % (ref 11.6–15.4)
WBC: 5.6 10*3/uL (ref 3.4–10.8)

## 2019-06-30 LAB — LIPID PANEL
Chol/HDL Ratio: 3.1 ratio (ref 0.0–5.0)
Cholesterol, Total: 161 mg/dL (ref 100–199)
HDL: 52 mg/dL (ref >39)
LDL Chol Calc (NIH): 96 mg/dL (ref 0–99)
Triglycerides: 64 mg/dL (ref 0–149)
VLDL Cholesterol Cal: 13 mg/dL (ref 5–40)

## 2019-07-24 ENCOUNTER — Other Ambulatory Visit: Payer: Self-pay | Admitting: Neurology

## 2019-08-07 ENCOUNTER — Other Ambulatory Visit: Payer: Self-pay | Admitting: Family Medicine

## 2019-08-07 DIAGNOSIS — N528 Other male erectile dysfunction: Secondary | ICD-10-CM

## 2019-08-07 NOTE — Telephone Encounter (Signed)
Is this okay to refill? 

## 2019-08-27 ENCOUNTER — Encounter: Payer: Self-pay | Admitting: Family Medicine

## 2019-08-27 ENCOUNTER — Other Ambulatory Visit: Payer: Self-pay

## 2019-08-27 ENCOUNTER — Ambulatory Visit: Payer: 59 | Admitting: Family Medicine

## 2019-08-27 VITALS — BP 140/86 | HR 90 | Temp 98.0°F | Ht 76.0 in | Wt 181.4 lb

## 2019-08-27 DIAGNOSIS — G4486 Cervicogenic headache: Secondary | ICD-10-CM | POA: Insufficient documentation

## 2019-08-27 DIAGNOSIS — Z0000 Encounter for general adult medical examination without abnormal findings: Secondary | ICD-10-CM | POA: Diagnosis not present

## 2019-08-27 DIAGNOSIS — K219 Gastro-esophageal reflux disease without esophagitis: Secondary | ICD-10-CM

## 2019-08-27 DIAGNOSIS — L649 Androgenic alopecia, unspecified: Secondary | ICD-10-CM

## 2019-08-27 DIAGNOSIS — G8929 Other chronic pain: Secondary | ICD-10-CM

## 2019-08-27 DIAGNOSIS — Z23 Encounter for immunization: Secondary | ICD-10-CM | POA: Diagnosis not present

## 2019-08-27 DIAGNOSIS — Z85820 Personal history of malignant melanoma of skin: Secondary | ICD-10-CM | POA: Diagnosis not present

## 2019-08-27 DIAGNOSIS — M545 Low back pain, unspecified: Secondary | ICD-10-CM

## 2019-08-27 DIAGNOSIS — G9519 Other vascular myelopathies: Secondary | ICD-10-CM

## 2019-08-27 DIAGNOSIS — M5090 Cervical disc disorder, unspecified, unspecified cervical region: Secondary | ICD-10-CM

## 2019-08-27 DIAGNOSIS — D692 Other nonthrombocytopenic purpura: Secondary | ICD-10-CM | POA: Insufficient documentation

## 2019-08-27 DIAGNOSIS — Z87891 Personal history of nicotine dependence: Secondary | ICD-10-CM

## 2019-08-27 DIAGNOSIS — R7302 Impaired glucose tolerance (oral): Secondary | ICD-10-CM

## 2019-08-27 DIAGNOSIS — R519 Headache, unspecified: Secondary | ICD-10-CM

## 2019-08-27 DIAGNOSIS — N5201 Erectile dysfunction due to arterial insufficiency: Secondary | ICD-10-CM

## 2019-08-27 DIAGNOSIS — Z136 Encounter for screening for cardiovascular disorders: Secondary | ICD-10-CM

## 2019-08-27 DIAGNOSIS — E7439 Other disorders of intestinal carbohydrate absorption: Secondary | ICD-10-CM | POA: Insufficient documentation

## 2019-08-27 DIAGNOSIS — M48062 Spinal stenosis, lumbar region with neurogenic claudication: Secondary | ICD-10-CM | POA: Diagnosis not present

## 2019-08-27 LAB — POCT GLYCOSYLATED HEMOGLOBIN (HGB A1C): Hemoglobin A1C: 5.8 % — AB (ref 4.0–5.6)

## 2019-08-27 LAB — POCT URINALYSIS DIP (PROADVANTAGE DEVICE)
Bilirubin, UA: NEGATIVE
Blood, UA: NEGATIVE
Glucose, UA: NEGATIVE mg/dL
Ketones, POC UA: NEGATIVE mg/dL
Leukocytes, UA: NEGATIVE
Nitrite, UA: NEGATIVE
Protein Ur, POC: NEGATIVE mg/dL
Specific Gravity, Urine: 1.01
Urobilinogen, Ur: 0.2
pH, UA: 7 (ref 5.0–8.0)

## 2019-08-27 MED ORDER — TADALAFIL 20 MG PO TABS: ORAL_TABLET | ORAL | 5 refills | Status: DC

## 2019-08-27 NOTE — Progress Notes (Signed)
   Subjective:    [name], male    DOB: 1955-08-06, 64 y.o.   MRN: [id number]  HPI He is here for complete examination.  He has a long history of cervical as well as lumbar back issues including cervical disc disease, neurogenic claudication, cervicogenic headache.  He is on multiple medications for this including Celebrex, Soma compound, occasional use of Vicodin as well as nortriptyline is helping with headaches as well as for the neurogenic claudication symptoms that he is also having.  Codeine is being renewed by Dr. Alfonso Ramus.  He does have some reflux symptoms usually once or twice per week.  He also notes difficulty with right knee inflexibility especially with full extension.  He does have some difficulty with ED and would like a refill on his Cialis.  He is on multiple over-the-counter medications.  He is also taking Propecia for his hair loss.  He uses Ambien on an as-needed basis to help with his shiftwork insomnia.  He usually works a third shift job.  Review of his record does indicate a slightly elevated blood sugar.  His work is going well.  His home life is fairly stable.  He is involved in some counseling to help deal with some obsessive type thinking revolving around an old girlfriend.  He continues to be evaluated by dermatology for continued trouble with melanoma.  Apparently a few more lesions were found recently.  Family and social history as well as health maintenance immunizations was reviewed   Review of Systems  Constitutional: Negative.        Objective:   Physical Exam Alert and in no distress. Tympanic membranes and canals are normal. Pharyngeal area is normal. Neck is supple without adenopathy or thyromegaly. Cardiac exam shows a regular sinus rhythm without murmurs or gallops. Lungs are clear to auscultation.  Forearms do show purpuric lesions.  Right knee shows lack of full extension.  He can extend to roughly 170 degrees. Hemoglobin A1c is 5.8 Recent  blood work was reviewed.     Assessment & Plan:  Routine general medical examination at a health care facility - Plan: POCT Urinalysis DIP (Proadvantage Device), POCT glycosylated hemoglobin (Hb A1C)  History of melanoma: Continue follow-up with dermatology  Neurogenic claudication: Continue with nortriptyline Glucose intolerance (impaired glucose tolerance)  Gastroesophageal reflux disease without esophagitis: Recommend Prilosec daily for the next week to see if that will quiet his symptoms down and if further trouble he is to call me.  Erectile dysfunction due to arterial insufficiency - Plan: tadalafil (CIALIS) 20 MG tablet  Chronic bilateral low back pain without sciatica  Cervical disc disease: Continue on his multiple medications for the cervical as well as spinal disc disease.  Former smoker - Plan: US AORTA  Spinal stenosis of lumbar region with neurogenic claudication  Need for shingles vaccine - Plan: Varicella-zoster vaccine IM (Shingrix)  Screening for AAA (abdominal aortic aneurysm) - Plan: US AORTA  Cervicogenic headache: Continue on nortriptyline Senile purpura ([place])  Male pattern baldness: Continue on finasteride Glucose intolerance: No therapy at the present time but continue to monitor I will stop Flexeril as he does not need both Flexeril and Soma. Recommend possible follow-up with orthopedics concerning lack of full extension of his right knee.

## 2019-10-15 ENCOUNTER — Other Ambulatory Visit: Payer: Self-pay | Admitting: Family Medicine

## 2019-11-12 ENCOUNTER — Other Ambulatory Visit: Payer: Self-pay | Admitting: Neurology

## 2019-11-12 ENCOUNTER — Other Ambulatory Visit: Payer: Self-pay | Admitting: Family Medicine

## 2019-11-12 NOTE — Telephone Encounter (Signed)
[facility] Please advise [facility]

## 2019-11-26 ENCOUNTER — Ambulatory Visit
Admission: RE | Admit: 2019-11-26 | Discharge: 2019-11-26 | Disposition: A | Discharge status: Home / Self Care | Payer: 59 | Source: Ambulatory Visit | Attending: Family Medicine | Admitting: Family Medicine

## 2019-11-26 ENCOUNTER — Other Ambulatory Visit: Payer: Self-pay

## 2019-12-01 ENCOUNTER — Other Ambulatory Visit (INDEPENDENT_AMBULATORY_CARE_PROVIDER_SITE_OTHER): Payer: 59

## 2019-12-01 DIAGNOSIS — Z23 Encounter for immunization: Secondary | ICD-10-CM | POA: Diagnosis not present

## 2019-12-03 ENCOUNTER — Other Ambulatory Visit: Payer: Self-pay | Admitting: Family Medicine

## 2019-12-03 NOTE — Telephone Encounter (Signed)
[facility]. Please advise [facility]

## 2019-12-09 ENCOUNTER — Other Ambulatory Visit: Payer: Self-pay

## 2019-12-09 ENCOUNTER — Ambulatory Visit: Payer: 59

## 2019-12-09 DIAGNOSIS — Z7185 Encounter for immunization safety counseling: Secondary | ICD-10-CM

## 2019-12-24 ENCOUNTER — Other Ambulatory Visit: Payer: Self-pay | Admitting: Family Medicine

## 2019-12-24 NOTE — Telephone Encounter (Signed)
[facility] ambien. Please advise [facility] 

## 2020-01-06 ENCOUNTER — Ambulatory Visit: Payer: 59 | Admitting: Family Medicine

## 2020-01-06 ENCOUNTER — Encounter: Payer: Self-pay | Admitting: Family Medicine

## 2020-01-06 ENCOUNTER — Other Ambulatory Visit: Payer: Self-pay

## 2020-01-06 VITALS — BP 134/88 | HR 87 | Temp 96.8°F | Wt 184.4 lb

## 2020-01-06 DIAGNOSIS — M7989 Other specified soft tissue disorders: Secondary | ICD-10-CM | POA: Diagnosis not present

## 2020-01-06 DIAGNOSIS — M79641 Pain in right hand: Secondary | ICD-10-CM | POA: Diagnosis not present

## 2020-01-06 NOTE — Progress Notes (Addendum)
   Subjective:    [name], male    DOB: [birth year], 64 y.o.   MRN: [id number]  HPI He is here for evaluation of her right hand pain and swelling.  He does note that he does get some tingling sensation in the median nerve distribution when he is on his computer however he notes difficulty with pain on the palmar surface of his hand.   Review of Systems     Objective:   Physical Exam Exam of the right hand does show edema on the palmar surface especially in the thumb and proximal past the wrist by several centimeters.  It is not erythematous or warm or necessarily tender to palpation.  Tinel's test was negative.  Pulses are normal.  No edema is noted on the dorsal surface of his hand.       Assessment & Plan:  Right hand pain - Plan: Ambulatory referral to Orthopedic Surgery  Swelling of right hand - Plan: Ambulatory referral to Orthopedic Surgery Some of his symptoms are certainly CTS but I cannot explain the swelling on the palmar surface of his hand and none on the ventral surface.  I will refer to orthopedics to get their input into this. He is to take Celebrex twice per day.

## 2020-01-06 NOTE — Progress Notes (Deleted)
     Established patient visit   Patient: Joseph [name]   DOB: [birth year]   64 y.o. Male  MRN: [id number] Visit Date: 01/06/2020  Today's healthcare provider: Jill Alexanders, MD   Chief Complaint  Patient presents with  . other    right hand pain and numbness in four of the five fingers   Subjective    HPI  Pain  He reports {chronicity:119221} {location:1} pain. {There was/was not:31712} an injury that may have caused the pain. The pain started {onset:119223} and is {progression:119226}. The pain {does/does not:200015} radiate {describe radiation if present:1}. The pain is described as {quality:31200}, is {severity:119268} in intensity, occurring {frequency of symptoms:119294}. {Symptoms are worse in the:16448:::1}  {Aggravating factors:16449:::1} {Relieving factors:16449:::1}.  He has tried {treatments tried:173219}{improvement:119303}.   ---------------------------------------------------------------------------------------------------  HPI    other     Additional comments: right hand pain and numbness in four of the five fingers       Last edited by Elyse Jarvis, [place] on 01/06/2020  2:44 PM. (History)      ***  {Show patient history (optional):23778::" "}   Medications: Outpatient Medications Prior to Visit  Medication Sig  . carisoprodol (SOMA) 350 MG tablet TAKE 1 TABLET BY MOUTH THREE TIMES A DAY AS NEEDED FOR SPASM  . celecoxib (CELEBREX) 200 MG capsule TAKE 1 CAPSULE BY MOUTH TWICE A DAY  . zolpidem (AMBIEN) 10 MG tablet TAKE 1 TABLET BY MOUTH AT BEDTIME AS NEEDED FOR SLEEP  . Ascorbic Acid (VITAMIN C) 1000 MG tablet Take 1,000 mg by mouth daily.  . Echinacea 400 MG CAPS Take by mouth.  . finasteride (PROPECIA) 1 MG tablet Take 1 mg by mouth daily. 1/4 TABLET DAILY  . glucosamine-chondroitin 500-400 MG tablet Take 1 tablet by mouth 3 (three) times daily.  Marland Kitchen HYDROcodone-acetaminophen (NORCO/VICODIN) 5-325 MG tablet Take 1 tablet by mouth every 6 (six) hours as  needed.  Javier Docker Oil 1000 MG CAPS Take by mouth.  . MELATONIN PO Take 10 mg by mouth.  . Multiple Vitamins-Minerals (MULTIVITAMIN WITH MINERALS) tablet Take 1 tablet by mouth daily.    . nortriptyline (PAMELOR) 50 MG capsule TAKE 1 CAPSULE BY MOUTH EVERYDAY AT BEDTIME  . tadalafil (CIALIS) 20 MG tablet TAKE 1 TABLET BY MOUTH EVERY DAY AS NEEDED FOR ERECTILE DYSFUNCTION  . TURMERIC PO Take by mouth.  Marland Kitchen VITAMIN D PO Take by mouth.  . Vitamin D, Ergocalciferol, (DRISDOL) 1.25 MG (50000 UNIT) CAPS capsule Take 50,000 Units by mouth every 7 (seven) days.  . vitamin E 100 UNIT capsule Take by mouth daily.  . Zinc 50 MG TABS Take by mouth.   No facility-administered medications prior to visit.    Review of Systems  {Heme  Chem  Endocrine  Serology  Results Review (optional):23779::" "}  Objective    There were no vitals taken for this visit. {Show previous vital signs (optional):23777::" "}  Physical Exam  ***  No results found for any visits on 01/06/20.  Assessment & Plan     ***  No follow-ups on file.      {provider attestation***:1}   Jill Alexanders, MD  [place] [phone] (phone) [phone] (fax)  [place]

## 2020-01-07 ENCOUNTER — Inpatient Hospital Stay (HOSPITAL_COMMUNITY)
Admission: AD | Admit: 2020-01-07 | Discharge: 2020-01-10 | DRG: 513 | Disposition: A | Discharge status: Home / Self Care | Payer: 59 | Source: Ambulatory Visit | Attending: Internal Medicine | Admitting: Internal Medicine

## 2020-01-07 DIAGNOSIS — L659 Nonscarring hair loss, unspecified: Secondary | ICD-10-CM | POA: Diagnosis present

## 2020-01-07 DIAGNOSIS — Z885 Allergy status to narcotic agent status: Secondary | ICD-10-CM

## 2020-01-07 DIAGNOSIS — L03011 Cellulitis of right finger: Secondary | ICD-10-CM | POA: Diagnosis present

## 2020-01-07 DIAGNOSIS — M67231 Synovial hypertrophy, not elsewhere classified, right forearm: Principal | ICD-10-CM | POA: Diagnosis present

## 2020-01-07 DIAGNOSIS — L03113 Cellulitis of right upper limb: Secondary | ICD-10-CM | POA: Diagnosis present

## 2020-01-07 DIAGNOSIS — R7401 Elevation of levels of liver transaminase levels: Secondary | ICD-10-CM | POA: Diagnosis not present

## 2020-01-07 DIAGNOSIS — I739 Peripheral vascular disease, unspecified: Secondary | ICD-10-CM | POA: Diagnosis present

## 2020-01-07 DIAGNOSIS — Z79899 Other long term (current) drug therapy: Secondary | ICD-10-CM

## 2020-01-07 DIAGNOSIS — Z87891 Personal history of nicotine dependence: Secondary | ICD-10-CM

## 2020-01-07 DIAGNOSIS — N179 Acute kidney failure, unspecified: Secondary | ICD-10-CM | POA: Diagnosis present

## 2020-01-07 DIAGNOSIS — Z20822 Contact with and (suspected) exposure to covid-19: Secondary | ICD-10-CM | POA: Diagnosis present

## 2020-01-07 DIAGNOSIS — Z888 Allergy status to other drugs, medicaments and biological substances status: Secondary | ICD-10-CM

## 2020-01-07 DIAGNOSIS — Z23 Encounter for immunization: Secondary | ICD-10-CM

## 2020-01-07 DIAGNOSIS — E871 Hypo-osmolality and hyponatremia: Secondary | ICD-10-CM | POA: Diagnosis present

## 2020-01-07 DIAGNOSIS — D509 Iron deficiency anemia, unspecified: Secondary | ICD-10-CM | POA: Diagnosis present

## 2020-01-07 DIAGNOSIS — Z85820 Personal history of malignant melanoma of skin: Secondary | ICD-10-CM

## 2020-01-07 DIAGNOSIS — K219 Gastro-esophageal reflux disease without esophagitis: Secondary | ICD-10-CM | POA: Diagnosis present

## 2020-01-07 LAB — COMPREHENSIVE METABOLIC PANEL
ALT: 23 U/L (ref 0–44)
AST: 30 U/L (ref 15–41)
Albumin: 3.7 g/dL (ref 3.5–5.0)
Alkaline Phosphatase: 67 U/L (ref 38–126)
Anion gap: 9 (ref 5–15)
BUN: 19 mg/dL (ref 8–23)
CO2: 23 mmol/L (ref 22–32)
Calcium: 8.7 mg/dL — ABNORMAL LOW (ref 8.9–10.3)
Chloride: 98 mmol/L (ref 98–111)
Creatinine, Ser: 1.4 mg/dL — ABNORMAL HIGH (ref 0.61–1.24)
GFR, Estimated: 52 mL/min — ABNORMAL LOW (ref >60)
Glucose, Bld: 131 mg/dL — ABNORMAL HIGH (ref 70–99)
Potassium: 4 mmol/L (ref 3.5–5.1)
Sodium: 130 mmol/L — ABNORMAL LOW (ref 135–145)
Total Bilirubin: 0.4 mg/dL (ref 0.3–1.2)
Total Protein: 6.5 g/dL (ref 6.5–8.1)

## 2020-01-07 LAB — CBC WITH DIFFERENTIAL/PLATELET
Abs Immature Granulocytes: 0.03 10*3/uL (ref 0.00–0.07)
Basophils Absolute: 0.1 10*3/uL (ref 0.0–0.1)
Basophils Relative: 1 %
Eosinophils Absolute: 0.1 10*3/uL (ref 0.0–0.5)
Eosinophils Relative: 2 %
HCT: 31.7 % — ABNORMAL LOW (ref 39.0–52.0)
Hemoglobin: 10.4 g/dL — ABNORMAL LOW (ref 13.0–17.0)
Immature Granulocytes: 0 %
Lymphocytes Relative: 18 %
Lymphs Abs: 1.5 10*3/uL (ref 0.7–4.0)
MCH: 29.1 pg (ref 26.0–34.0)
MCHC: 32.8 g/dL (ref 30.0–36.0)
MCV: 88.5 fL (ref 80.0–100.0)
Monocytes Absolute: 1.2 10*3/uL — ABNORMAL HIGH (ref 0.1–1.0)
Monocytes Relative: 14 %
Neutro Abs: 5.6 10*3/uL (ref 1.7–7.7)
Neutrophils Relative %: 65 %
Platelets: 361 10*3/uL (ref 150–400)
RBC: 3.58 MIL/uL — ABNORMAL LOW (ref 4.22–5.81)
RDW: 13.4 % (ref 11.5–15.5)
WBC: 8.6 10*3/uL (ref 4.0–10.5)
nRBC: 0 % (ref 0.0–0.2)

## 2020-01-07 LAB — HIV ANTIBODY (ROUTINE TESTING W REFLEX): HIV Screen 4th Generation wRfx: NONREACTIVE

## 2020-01-07 LAB — URIC ACID: Uric Acid, Serum: 6 mg/dL (ref 3.7–8.6)

## 2020-01-07 LAB — RESPIRATORY PANEL BY RT PCR (FLU A&B, COVID)
Influenza A by PCR: NEGATIVE
Influenza B by PCR: NEGATIVE
SARS Coronavirus 2 by RT PCR: NEGATIVE

## 2020-01-07 LAB — PROCALCITONIN: Procalcitonin: 0.14 ng/mL

## 2020-01-07 LAB — MRSA PCR SCREENING: MRSA by PCR: NEGATIVE

## 2020-01-07 MED ORDER — ACETAMINOPHEN 650 MG RE SUPP: 650.0000 mg | Freq: Four times a day (QID) | RECTAL | Status: DC | PRN

## 2020-01-07 MED ORDER — HYDROCODONE-ACETAMINOPHEN 5-325 MG PO TABS
1.0000 | ORAL_TABLET | ORAL | Status: DC | PRN
  Administered 2020-01-08: 2 via ORAL
  Filled 2020-01-07: qty 2

## 2020-01-07 MED ORDER — HYDROCODONE-ACETAMINOPHEN 5-325 MG PO TABS
1.0000 | ORAL_TABLET | ORAL | Status: DC | PRN
  Administered 2020-01-07: 2 via ORAL
  Filled 2020-01-07: qty 2

## 2020-01-07 MED ORDER — SODIUM CHLORIDE 0.9 % IV SOLN
1.0000 g | INTRAVENOUS | Status: DC
  Administered 2020-01-07 – 2020-01-09 (×3): 1 g via INTRAVENOUS
  Filled 2020-01-07: qty 10
  Filled 2020-01-07: qty 1
  Filled 2020-01-07: qty 10
  Filled 2020-01-07 (×2): qty 1

## 2020-01-07 MED ORDER — MORPHINE SULFATE (PF) 2 MG/ML IV SOLN
2.0000 mg | INTRAVENOUS | Status: DC | PRN
  Administered 2020-01-07: 2 mg via INTRAVENOUS
  Administered 2020-01-08: 4 mg via INTRAVENOUS
  Filled 2020-01-07: qty 2
  Filled 2020-01-07: qty 1

## 2020-01-07 MED ORDER — ACETAMINOPHEN 325 MG PO TABS: 650.0000 mg | ORAL_TABLET | Freq: Four times a day (QID) | ORAL | Status: DC | PRN

## 2020-01-07 MED ORDER — ENOXAPARIN SODIUM 40 MG/0.4ML ~~LOC~~ SOLN
40.0000 mg | SUBCUTANEOUS | Status: DC
  Administered 2020-01-07 – 2020-01-09 (×3): 40 mg via SUBCUTANEOUS
  Filled 2020-01-07 (×3): qty 0.4

## 2020-01-07 MED ORDER — ONDANSETRON HCL 4 MG PO TABS: 4.0000 mg | ORAL_TABLET | Freq: Four times a day (QID) | ORAL | Status: DC | PRN

## 2020-01-07 MED ORDER — ONDANSETRON HCL 4 MG/2ML IJ SOLN: 4.0000 mg | Freq: Four times a day (QID) | INTRAMUSCULAR | Status: DC | PRN

## 2020-01-07 NOTE — H&P (Signed)
History and Physical    Joseph [name] OVF:[id number] DOB: 1955-07-21 DOA: 01/07/2020  PCP: Denita Lung, MD  Patient coming from: Home  I have personally briefly reviewed patient's old medical records in [place]  Chief Complaint: Hand infection  HPI: Joseph [name] is a 64 y.o. male with medical history significant of DDD, GERD.  Pt had cut over thumb x3 weeks ago.  Since that time he has had progressively progressive erythema, edema, pain.  Starting in thumb but eventually extending down thumb into wrist and now up forearm.  Pain really started to get worse over past couple days.  Finally presented to PCP yesterday who promptly referred him to Hand surgeon whom he saw today.  Denies, fever, chills, nausea, vomiting.  Dr. Burney Gauze saw pt in office today (see also his note).  He is of the opinion that pt has cellulitis of R forearm.  Doesn't feel pt has flexor sheath involvement or abscess.  Feels pt needs admission for IV ABx empirically.   Review of Systems: As per HPI, otherwise all review of systems negative.  Past Medical History:  Diagnosis Date  . Alopecia   . DDD (degenerative disc disease)   . Degenerative scoliosis 07/07/2018  . Diverticulosis   . ED (erectile dysfunction)   . GERD (gastroesophageal reflux disease)   . Melanoma [facility])    sees Dr. [place] Crane.  back and chest  . Neurogenic claudication ([place]) 07/07/2018  . Renal stone   . Spinal stenosis of lumbar region 07/07/2018    Past Surgical History:  Procedure Laterality Date  . KNEE ARTHROSCOPY  2008   RIGHT  . LUMBAR DISC SURGERY    . MELANOMA EXCISION       reports that he quit smoking about 25 years ago. He smoked 1.00 pack per day. He has never used smokeless tobacco. He reports current alcohol use of about 2.0 standard drinks of alcohol per week. He reports that he does not use drugs.  Allergies  Allergen Reactions  . Daypro [Oxaprozin]     GASTRIC PROBLEMS  . Methadone Itching     Family History  Problem Relation Age of Onset  . Transient ischemic attack Father   . Multiple myeloma Father   . Bladder Cancer Father   . Celiac disease Mother   . Scoliosis Mother   . Colon cancer Neg Hx   . Esophageal cancer Neg Hx   . Liver disease Neg Hx   . Kidney disease Neg Hx   . Colon polyps Neg Hx      Prior to Admission medications   Medication Sig Start Date End Date Taking? Authorizing Provider  Ascorbic Acid (VITAMIN C) 1000 MG tablet Take 1,000 mg by mouth daily.    [provider]  carisoprodol (SOMA) 350 MG tablet TAKE 1 TABLET BY MOUTH THREE TIMES A DAY AS NEEDED FOR SPASM 12/03/19   Denita Lung, MD  celecoxib (CELEBREX) 200 MG capsule TAKE 1 CAPSULE BY MOUTH TWICE A DAY 11/12/19   Denita Lung, MD  Echinacea 400 MG CAPS Take by mouth.    [provider]  finasteride (PROPECIA) 1 MG tablet Take 1 mg by mouth daily. 1/4 TABLET DAILY    [provider]  glucosamine-chondroitin 500-400 MG tablet Take 1 tablet by mouth 3 (three) times daily.    [provider]  HYDROcodone-acetaminophen (NORCO/VICODIN) 5-325 MG tablet Take 1 tablet by mouth every 6 (six) hours as needed. 10/28/15   Nanavati, Ankit,  MD  Krill Oil 1000 MG CAPS Take by mouth.    [provider]  MELATONIN PO Take 10 mg by mouth.    [provider]  Multiple Vitamins-Minerals (MULTIVITAMIN WITH MINERALS) tablet Take 1 tablet by mouth daily.      [provider]  nortriptyline (PAMELOR) 50 MG capsule TAKE 1 CAPSULE BY MOUTH EVERYDAY AT BEDTIME 11/12/19   Jaffe, Adam R, DO  tadalafil (CIALIS) 20 MG tablet TAKE 1 TABLET BY MOUTH EVERY DAY AS NEEDED FOR ERECTILE DYSFUNCTION 08/27/19   Denita Lung, MD  TURMERIC PO Take by mouth.    [provider]  VITAMIN D PO Take by mouth.    [provider]  Vitamin D, Ergocalciferol, (DRISDOL) 1.25 MG (50000 UNIT) CAPS capsule Take 50,000 Units by mouth every 7 (seven) days. Patient  not taking: Reported on 01/06/2020    [provider]  vitamin E 100 UNIT capsule Take by mouth daily.    [provider]  Zinc 50 MG TABS Take by mouth.    [provider]  zolpidem (AMBIEN) 10 MG tablet TAKE 1 TABLET BY MOUTH AT BEDTIME AS NEEDED FOR SLEEP 12/24/19   Denita Lung, MD    Physical Exam: Vitals:   01/07/20 1936  BP: 114/73  Pulse: 93  Resp: 18  Temp: 99.3 F (37.4 C)  TempSrc: Oral  SpO2: 97%  Weight: 84.1 kg  Height: 6' 4"  (1.93 m)    Constitutional: NAD, calm, comfortable Eyes: PERRL, lids and conjunctivae normal ENMT: Mucous membranes are moist. Posterior pharynx clear of any exudate or lesions.Normal dentition.  Neck: normal, supple, no masses, no thyromegaly Respiratory: clear to auscultation bilaterally, no wheezing, no crackles. Normal respiratory effort. No accessory muscle use.  Cardiovascular: Regular rate and rhythm, no murmurs / rubs / gallops. No extremity edema. 2+ pedal pulses. No carotid bruits.  Abdomen: no tenderness, no masses palpated. No hepatosplenomegaly. Bowel sounds positive.  Musculoskeletal: no clubbing / cyanosis. No joint deformity upper and lower extremities. Good ROM, no contractures. Normal muscle tone.  Skin: Erythema and edema of R thumb, extends into wrist and forearm.  Full ROM of R thumb. Neurologic: CN 2-12 grossly intact. Sensation intact, DTR normal. Strength 5/5 in all 4.  Psychiatric: Normal judgment and insight. Alert and oriented x 3. Normal mood.    Labs on Admission: I have personally reviewed following labs and imaging studies  CBC: No results for input(s): WBC, NEUTROABS, HGB, HCT, MCV, PLT in the last 168 hours. Basic Metabolic Panel: No results for input(s): NA, K, CL, CO2, GLUCOSE, BUN, CREATININE, CALCIUM, MG, PHOS in the last 168 hours. GFR: CrCl cannot be calculated (Patient's most recent lab result is older than the maximum 21 days allowed.). Liver Function Tests: No results  for input(s): AST, ALT, ALKPHOS, BILITOT, PROT, ALBUMIN in the last 168 hours. No results for input(s): LIPASE, AMYLASE in the last 168 hours. No results for input(s): AMMONIA in the last 168 hours. Coagulation Profile: No results for input(s): INR, PROTIME in the last 168 hours. Cardiac Enzymes: No results for input(s): CKTOTAL, CKMB, CKMBINDEX, TROPONINI in the last 168 hours. BNP (last 3 results) No results for input(s): PROBNP in the last 8760 hours. HbA1C: No results for input(s): HGBA1C in the last 72 hours. CBG: No results for input(s): GLUCAP in the last 168 hours. Lipid Profile: No results for input(s): CHOL, HDL, LDLCALC, TRIG, CHOLHDL, LDLDIRECT in the last 72 hours. Thyroid Function Tests: No results for input(s):  TSH, T4TOTAL, FREET4, T3FREE, THYROIDAB in the last 72 hours. Anemia Panel: No results for input(s): VITAMINB12, FOLATE, FERRITIN, TIBC, IRON, RETICCTPCT in the last 72 hours. Urine analysis:    Component Value Date/Time   COLORURINE YELLOW 07/30/2008 1307   APPEARANCEUR CLEAR 07/30/2008 1307   LABSPEC 1.010 08/27/2019 1608   PHURINE 6.0 07/30/2008 1307   GLUCOSEU NEGATIVE 07/30/2008 1307   HGBUR MODERATE (A) 07/30/2008 1307   BILIRUBINUR negative 08/27/2019 1608   BILIRUBINUR n 01/17/2015 1133   KETONESUR negative 08/27/2019 1608   KETONESUR 40 (A) 07/30/2008 1307   PROTEINUR negative 08/27/2019 1608   PROTEINUR n 01/17/2015 1133   PROTEINUR NEGATIVE 07/30/2008 1307   UROBILINOGEN negative 01/17/2015 1133   UROBILINOGEN 0.2 07/30/2008 1307   NITRITE Negative 08/27/2019 1608   NITRITE n 01/17/2015 1133   NITRITE NEGATIVE 07/30/2008 1307   LEUKOCYTESUR Negative 08/27/2019 1608    Radiological Exams on Admission: No results found.  EKG: Independently reviewed.  Assessment/Plan Principal Problem:   Cellulitis of right hand Active Problems:   Cellulitis of right forearm    1. Cellulitis of Right thumb, hand, and forearm - 1. Dr. Burney Gauze sent  in from office: 1. Needs IV abx 2. No evidence of flexor sheath involvement per his note 3. Not surgical at this time 2. Therefore: cellulitis pathway 3. Empiric rocephin for the moment 4. BCx 5. CBC/BMP 6. Check uric acid - though HPI more c/w infection (Cut on thumb as nidus for infection, then infection spreading from there) 7. Check MRSA PCR nares (if positive, add vanc) 8. Norco PRN pain  DVT prophylaxis: Lovenox Code Status: Full Family Communication: No family in room Disposition Plan: Home after infection improving Consults called: Dr. Burney Gauze sent in for admission from office, accepting physician asked them to follow during stay Admission status: Place in obs    Bellamia Ferch, [place] Hospitalists  How to contact the [facility] Attending or Consulting provider [place] or covering provider during after hours [place], for this patient?  1. Check the care team in [facility] and look for a) attending/consulting [facility] provider listed and b) the [facility] team listed 2. Log into [email or web address]  Amion Physician Scheduling and messaging for groups and whole hospitals  On call and physician scheduling software for group practices, residents, hospitalists and other medical providers for call, clinic, rotation and shift schedules. OnCall Enterprise is a hospital-wide system for scheduling doctors and paging doctors on call. EasyPlot is for scientific plotting and data analysis.  [email or web address]  and use [facility]'s universal password to access. If you do not have the password, please contact the hospital operator.  3. Locate the [facility] provider you are looking for under [facility] and page to a number that you can be directly reached. 4. If you still have difficulty reaching the provider, please page the [facility] (Director on Call) for the Hospitalists listed on amion for assistance.  01/07/2020, 9:15 PM

## 2020-01-08 ENCOUNTER — Other Ambulatory Visit: Payer: Self-pay

## 2020-01-08 ENCOUNTER — Inpatient Hospital Stay (HOSPITAL_COMMUNITY): Payer: 59 | Admitting: Certified Registered Nurse Anesthetist

## 2020-01-08 ENCOUNTER — Encounter (HOSPITAL_COMMUNITY): Payer: Self-pay | Admitting: Internal Medicine

## 2020-01-08 ENCOUNTER — Encounter (HOSPITAL_COMMUNITY)
Admission: AD | Disposition: A | Discharge status: Home / Self Care | Payer: Self-pay | Source: Ambulatory Visit | Attending: Internal Medicine

## 2020-01-08 ENCOUNTER — Other Ambulatory Visit: Payer: Self-pay | Admitting: Orthopedic Surgery

## 2020-01-08 DIAGNOSIS — E871 Hypo-osmolality and hyponatremia: Secondary | ICD-10-CM

## 2020-01-08 DIAGNOSIS — Z885 Allergy status to narcotic agent status: Secondary | ICD-10-CM | POA: Diagnosis not present

## 2020-01-08 DIAGNOSIS — Z20822 Contact with and (suspected) exposure to covid-19: Secondary | ICD-10-CM | POA: Diagnosis present

## 2020-01-08 DIAGNOSIS — D649 Anemia, unspecified: Secondary | ICD-10-CM | POA: Diagnosis not present

## 2020-01-08 DIAGNOSIS — L659 Nonscarring hair loss, unspecified: Secondary | ICD-10-CM | POA: Diagnosis present

## 2020-01-08 DIAGNOSIS — Z87891 Personal history of nicotine dependence: Secondary | ICD-10-CM | POA: Diagnosis not present

## 2020-01-08 DIAGNOSIS — L03011 Cellulitis of right finger: Secondary | ICD-10-CM | POA: Diagnosis present

## 2020-01-08 DIAGNOSIS — M67231 Synovial hypertrophy, not elsewhere classified, right forearm: Secondary | ICD-10-CM | POA: Diagnosis present

## 2020-01-08 DIAGNOSIS — Z85820 Personal history of malignant melanoma of skin: Secondary | ICD-10-CM | POA: Diagnosis not present

## 2020-01-08 DIAGNOSIS — Z888 Allergy status to other drugs, medicaments and biological substances status: Secondary | ICD-10-CM | POA: Diagnosis not present

## 2020-01-08 DIAGNOSIS — D509 Iron deficiency anemia, unspecified: Secondary | ICD-10-CM | POA: Diagnosis present

## 2020-01-08 DIAGNOSIS — I739 Peripheral vascular disease, unspecified: Secondary | ICD-10-CM | POA: Diagnosis present

## 2020-01-08 DIAGNOSIS — K219 Gastro-esophageal reflux disease without esophagitis: Secondary | ICD-10-CM | POA: Diagnosis present

## 2020-01-08 DIAGNOSIS — Z79899 Other long term (current) drug therapy: Secondary | ICD-10-CM | POA: Diagnosis not present

## 2020-01-08 DIAGNOSIS — L03113 Cellulitis of right upper limb: Secondary | ICD-10-CM | POA: Diagnosis present

## 2020-01-08 DIAGNOSIS — N179 Acute kidney failure, unspecified: Secondary | ICD-10-CM | POA: Diagnosis present

## 2020-01-08 DIAGNOSIS — Z23 Encounter for immunization: Secondary | ICD-10-CM | POA: Diagnosis not present

## 2020-01-08 DIAGNOSIS — R7401 Elevation of levels of liver transaminase levels: Secondary | ICD-10-CM | POA: Diagnosis not present

## 2020-01-08 HISTORY — PX: I & D EXTREMITY: SHX5045

## 2020-01-08 LAB — CBC
HCT: 33.5 % — ABNORMAL LOW (ref 39.0–52.0)
Hemoglobin: 10.9 g/dL — ABNORMAL LOW (ref 13.0–17.0)
MCH: 28.8 pg (ref 26.0–34.0)
MCHC: 32.5 g/dL (ref 30.0–36.0)
MCV: 88.6 fL (ref 80.0–100.0)
Platelets: 401 10*3/uL — ABNORMAL HIGH (ref 150–400)
RBC: 3.78 MIL/uL — ABNORMAL LOW (ref 4.22–5.81)
RDW: 13.5 % (ref 11.5–15.5)
WBC: 12.1 10*3/uL — ABNORMAL HIGH (ref 4.0–10.5)
nRBC: 0 % (ref 0.0–0.2)

## 2020-01-08 LAB — BASIC METABOLIC PANEL
Anion gap: 11 (ref 5–15)
BUN: 14 mg/dL (ref 8–23)
CO2: 22 mmol/L (ref 22–32)
Calcium: 9.3 mg/dL (ref 8.9–10.3)
Chloride: 96 mmol/L — ABNORMAL LOW (ref 98–111)
Creatinine, Ser: 0.92 mg/dL (ref 0.61–1.24)
GFR, Estimated: >60 mL/min (ref >60)
Glucose, Bld: 127 mg/dL — ABNORMAL HIGH (ref 70–99)
Potassium: 4.3 mmol/L (ref 3.5–5.1)
Sodium: 129 mmol/L — ABNORMAL LOW (ref 135–145)

## 2020-01-08 LAB — C-REACTIVE PROTEIN: CRP: 3.7 mg/dL — ABNORMAL HIGH (ref <1.0)

## 2020-01-08 LAB — SEDIMENTATION RATE: Sed Rate: 44 mm/hr — ABNORMAL HIGH (ref 0–16)

## 2020-01-08 SURGERY — IRRIGATION AND DEBRIDEMENT EXTREMITY
Anesthesia: General | Laterality: Right

## 2020-01-08 MED ORDER — LACTATED RINGERS IV SOLN: INTRAVENOUS | Status: DC

## 2020-01-08 MED ORDER — HYDROMORPHONE HCL 1 MG/ML IJ SOLN
0.5000 mg | INTRAMUSCULAR | Status: DC | PRN
  Administered 2020-01-08: 1 mg via INTRAVENOUS
  Filled 2020-01-08: qty 1

## 2020-01-08 MED ORDER — LABETALOL HCL 5 MG/ML IV SOLN
INTRAVENOUS | Status: AC
  Administered 2020-01-08: 5 mg via INTRAVENOUS
  Filled 2020-01-08: qty 4

## 2020-01-08 MED ORDER — INFLUENZA VAC A&B SA ADJ QUAD 0.5 ML IM PRSY
0.5000 mL | PREFILLED_SYRINGE | INTRAMUSCULAR | Status: AC
  Administered 2020-01-10: 0.5 mL via INTRAMUSCULAR
  Filled 2020-01-08: qty 0.5

## 2020-01-08 MED ORDER — ENALAPRILAT 1.25 MG/ML IV SOLN
0.6250 mg | Freq: Once | INTRAVENOUS | Status: AC
  Administered 2020-01-08: 0.625 mg via INTRAVENOUS
  Filled 2020-01-08 (×2): qty 0.5

## 2020-01-08 MED ORDER — LIDOCAINE 2% (20 MG/ML) 5 ML SYRINGE
INTRAMUSCULAR | Status: AC
  Filled 2020-01-08: qty 5

## 2020-01-08 MED ORDER — VANCOMYCIN HCL IN DEXTROSE 1-5 GM/200ML-% IV SOLN
1000.0000 mg | Freq: Two times a day (BID) | INTRAVENOUS | Status: DC
  Administered 2020-01-08 – 2020-01-10 (×3): 1000 mg via INTRAVENOUS
  Filled 2020-01-08 (×4): qty 200

## 2020-01-08 MED ORDER — DEXAMETHASONE SODIUM PHOSPHATE 10 MG/ML IJ SOLN
INTRAMUSCULAR | Status: AC
  Filled 2020-01-08: qty 1

## 2020-01-08 MED ORDER — LABETALOL HCL 5 MG/ML IV SOLN
5.0000 mg | INTRAVENOUS | Status: AC | PRN
  Administered 2020-01-08: 5 mg via INTRAVENOUS

## 2020-01-08 MED ORDER — SODIUM CHLORIDE 0.9 % IR SOLN
Status: DC | PRN
  Administered 2020-01-08: 3000 mL

## 2020-01-08 MED ORDER — DEXAMETHASONE SODIUM PHOSPHATE 10 MG/ML IJ SOLN
INTRAMUSCULAR | Status: DC | PRN
  Administered 2020-01-08: 4 mg via INTRAVENOUS

## 2020-01-08 MED ORDER — FENTANYL CITRATE (PF) 100 MCG/2ML IJ SOLN
INTRAMUSCULAR | Status: DC | PRN
Start: 2020-01-08 — End: 2020-01-08
  Administered 2020-01-08: 25 ug via INTRAVENOUS

## 2020-01-08 MED ORDER — LIDOCAINE 2% (20 MG/ML) 5 ML SYRINGE
INTRAMUSCULAR | Status: DC | PRN
  Administered 2020-01-08: 80 mg via INTRAVENOUS

## 2020-01-08 MED ORDER — CARISOPRODOL 350 MG PO TABS
350.0000 mg | ORAL_TABLET | Freq: Two times a day (BID) | ORAL | Status: DC | PRN
  Administered 2020-01-08: 350 mg via ORAL
  Filled 2020-01-08: qty 1

## 2020-01-08 MED ORDER — FENTANYL CITRATE (PF) 100 MCG/2ML IJ SOLN
25.0000 ug | Freq: Once | INTRAMUSCULAR | Status: AC
  Administered 2020-01-08: 25 ug via INTRAVENOUS
  Filled 2020-01-08: qty 2

## 2020-01-08 MED ORDER — ONDANSETRON HCL 4 MG/2ML IJ SOLN
INTRAMUSCULAR | Status: AC
  Filled 2020-01-08: qty 2

## 2020-01-08 MED ORDER — FINASTERIDE 1 MG PO TABS
0.5000 mg | ORAL_TABLET | Freq: Every day | ORAL | Status: DC
  Filled 2020-01-08 (×2): qty 0.5

## 2020-01-08 MED ORDER — BUPIVACAINE HCL (PF) 0.25 % IJ SOLN
INTRAMUSCULAR | Status: AC
  Filled 2020-01-08: qty 30

## 2020-01-08 MED ORDER — PROPOFOL 10 MG/ML IV BOLUS
INTRAVENOUS | Status: AC
  Filled 2020-01-08: qty 20

## 2020-01-08 MED ORDER — PROPOFOL 10 MG/ML IV BOLUS
INTRAVENOUS | Status: DC | PRN
  Administered 2020-01-08: 200 mg via INTRAVENOUS

## 2020-01-08 MED ORDER — SODIUM CHLORIDE 0.9 % IV SOLN: INTRAVENOUS | Status: DC

## 2020-01-08 MED ORDER — HYDROCODONE-ACETAMINOPHEN 5-325 MG PO TABS
1.0000 | ORAL_TABLET | Freq: Four times a day (QID) | ORAL | Status: DC | PRN
  Administered 2020-01-08 – 2020-01-10 (×7): 1 via ORAL
  Filled 2020-01-08 (×7): qty 1

## 2020-01-08 MED ORDER — BUPIVACAINE HCL (PF) 0.25 % IJ SOLN
INTRAMUSCULAR | Status: DC | PRN
  Administered 2020-01-08: 10 mL

## 2020-01-08 MED ORDER — CHLORHEXIDINE GLUCONATE 0.12 % MT SOLN
15.0000 mL | Freq: Once | OROMUCOSAL | Status: AC
  Administered 2020-01-08: 15 mL via OROMUCOSAL
  Filled 2020-01-08: qty 15

## 2020-01-08 MED ORDER — HYDROMORPHONE HCL 1 MG/ML IJ SOLN
1.0000 mg | INTRAMUSCULAR | Status: DC | PRN
  Administered 2020-01-08 – 2020-01-10 (×7): 1 mg via INTRAVENOUS
  Filled 2020-01-08 (×8): qty 1

## 2020-01-08 MED ORDER — ONDANSETRON HCL 4 MG/2ML IJ SOLN
INTRAMUSCULAR | Status: DC | PRN
  Administered 2020-01-08: 4 mg via INTRAVENOUS

## 2020-01-08 MED ORDER — FENTANYL CITRATE (PF) 100 MCG/2ML IJ SOLN
INTRAMUSCULAR | Status: AC
  Filled 2020-01-08: qty 2

## 2020-01-08 MED ORDER — VANCOMYCIN HCL 1500 MG/300ML IV SOLN
1500.0000 mg | Freq: Once | INTRAVENOUS | Status: AC
  Administered 2020-01-08: 1500 mg via INTRAVENOUS
  Filled 2020-01-08: qty 300

## 2020-01-08 MED ORDER — VANCOMYCIN HCL IN DEXTROSE 1-5 GM/200ML-% IV SOLN
1000.0000 mg | Freq: Two times a day (BID) | INTRAVENOUS | Status: DC
  Filled 2020-01-08: qty 200

## 2020-01-08 MED ORDER — NORTRIPTYLINE HCL 25 MG PO CAPS
50.0000 mg | ORAL_CAPSULE | Freq: Every day | ORAL | Status: DC
  Administered 2020-01-08 – 2020-01-09 (×2): 50 mg via ORAL
  Filled 2020-01-08 (×3): qty 2

## 2020-01-08 MED ORDER — FENTANYL CITRATE (PF) 250 MCG/5ML IJ SOLN
INTRAMUSCULAR | Status: AC
  Filled 2020-01-08: qty 5

## 2020-01-08 SURGICAL SUPPLY — 33 items
BNDG CMPR 9X4 STRL LF SNTH (GAUZE/BANDAGES/DRESSINGS) ×1
BNDG CMPR STD VLCR NS LF 5.8X4 (GAUZE/BANDAGES/DRESSINGS) ×1
BNDG ELASTIC 4X5.8 VLCR NS LF (GAUZE/BANDAGES/DRESSINGS) ×3 IMPLANT
BNDG ESMARK 4X9 LF (GAUZE/BANDAGES/DRESSINGS) ×3 IMPLANT
BNDG GAUZE ELAST 4 BULKY (GAUZE/BANDAGES/DRESSINGS) ×3 IMPLANT
CORD BIPOLAR FORCEPS 12FT (ELECTRODE) ×3 IMPLANT
COVER SURGICAL LIGHT HANDLE (MISCELLANEOUS) ×3 IMPLANT
CUFF TOURN SGL QUICK 18X4 (TOURNIQUET CUFF) ×3 IMPLANT
DRAPE SURG 17X23 STRL (DRAPES) ×3 IMPLANT
DURAPREP 26ML APPLICATOR (WOUND CARE) ×3 IMPLANT
GAUZE PACKING IODOFORM 1/4X15 (PACKING) ×3 IMPLANT
GAUZE SPONGE 4X4 12PLY STRL (GAUZE/BANDAGES/DRESSINGS) ×3 IMPLANT
GLOVE SURG SYN 8.0 (GLOVE) ×3 IMPLANT
GOWN STRL REUS W/ TWL LRG LVL3 (GOWN DISPOSABLE) ×1 IMPLANT
GOWN STRL REUS W/ TWL XL LVL3 (GOWN DISPOSABLE) ×1 IMPLANT
GOWN STRL REUS W/TWL LRG LVL3 (GOWN DISPOSABLE) ×3
GOWN STRL REUS W/TWL XL LVL3 (GOWN DISPOSABLE) ×3
KIT BASIN OR (CUSTOM PROCEDURE TRAY) ×3 IMPLANT
KIT TURNOVER KIT B (KITS) ×3 IMPLANT
NS IRRIG 1000ML POUR BTL (IV SOLUTION) ×3 IMPLANT
PACK ORTHO EXTREMITY (CUSTOM PROCEDURE TRAY) ×3 IMPLANT
PADDING CAST SYNTHETIC 4 (CAST SUPPLIES) ×2
PADDING CAST SYNTHETIC 4X4 STR (CAST SUPPLIES) ×1 IMPLANT
SET CYSTO W/LG BORE CLAMP LF (SET/KITS/TRAYS/PACK) ×3 IMPLANT
SPLINT PLASTER CAST XFAST 4X15 (CAST SUPPLIES) ×1 IMPLANT
SPLINT PLASTER XTRA FAST SET 4 (CAST SUPPLIES) ×2
SUT ETHILON 3 0 PS 1 (SUTURE) ×3 IMPLANT
SWAB COLLECTION DEVICE MRSA (MISCELLANEOUS) ×3 IMPLANT
TOWEL GREEN STERILE (TOWEL DISPOSABLE) ×3 IMPLANT
TOWEL GREEN STERILE FF (TOWEL DISPOSABLE) ×3 IMPLANT
TUBE CONNECTING 12'X1/4 (SUCTIONS) ×1
TUBE CONNECTING 12X1/4 (SUCTIONS) ×2 IMPLANT
UNDERPAD 30X36 HEAVY ABSORB (UNDERPADS AND DIAPERS) ×3 IMPLANT

## 2020-01-08 NOTE — Plan of Care (Signed)
  Problem: Coping: Goal: Level of anxiety will decrease Outcome: Progressing   Problem: Coping: Goal: Level of anxiety will decrease Outcome: Progressing   Problem: Coping: Goal: Level of anxiety will decrease Outcome: Progressing   Problem: Coping: Goal: Level of anxiety will decrease Outcome: Progressing   Problem: Clinical Measurements: Goal: Ability to avoid or minimize complications of infection will improve Outcome: Not Progressing   Problem: Skin Integrity: Goal: Skin integrity will improve Outcome: Not Progressing

## 2020-01-08 NOTE — Consult Note (Signed)
Reason for Consult right wrist and hand pain, swelling, and decreased range of motion x72 hours Referring Physician: Darrnell Mangiaracina [name] is an 64 y.o. male.  HPI: Patient is a very pleasant 64 year old male with a history of a distant injury to the tip of the thumb with a increasing pain and swelling over the past 72 hours in the volar forearm and wrist on the right.  Patient had a white count of 8 yesterday and is now at 12.1 with worsening pain and swelling.  Past Medical History:  Diagnosis Date  . Alopecia   . DDD (degenerative disc disease)   . Degenerative scoliosis 07/07/2018  . Diverticulosis   . ED (erectile dysfunction)   . GERD (gastroesophageal reflux disease)   . Melanoma [facility])    sees Dr. [place] [name].  back and chest  . Neurogenic claudication ([place]) 07/07/2018  . Renal stone   . Spinal stenosis of lumbar region 07/07/2018    Past Surgical History:  Procedure Laterality Date  . KNEE ARTHROSCOPY  2008   RIGHT  . LUMBAR DISC SURGERY    . MELANOMA EXCISION      Family History  Problem Relation Age of Onset  . Transient ischemic attack Father   . Multiple myeloma Father   . Bladder Cancer Father   . Celiac disease Mother   . Scoliosis Mother   . Colon cancer Neg Hx   . Esophageal cancer Neg Hx   . Liver disease Neg Hx   . Kidney disease Neg Hx   . Colon polyps Neg Hx     Social History:  reports that he quit smoking about 25 years ago. He smoked 1.00 pack per day. He has never used smokeless tobacco. He reports current alcohol use of about 2.0 standard drinks of alcohol per week. He reports that he does not use drugs.  Allergies:  Allergies  Allergen Reactions  . Daypro [Oxaprozin]     GASTRIC PROBLEMS  . Methadone Itching    Medications:  Scheduled: . [MAR Hold] enoxaparin (LOVENOX) injection  40 mg Subcutaneous Q24H  . [MAR Hold] finasteride  0.5 mg Oral Daily  . [START ON 01/09/2020] influenza vaccine adjuvanted  0.5 mL Intramuscular Tomorrow-1000  .  [MAR Hold] nortriptyline  50 mg Oral QHS    Results for orders placed or performed during the hospital encounter of 01/07/20 (from the past 48 hour(s))  Culture, blood (routine x 2)     Status: None (Preliminary result)   Collection Time: 01/07/20  9:39 PM   Specimen: BLOOD  Result Value Ref Range   Specimen Description BLOOD RIGHT ANTECUBITAL    Special Requests      BOTTLES DRAWN AEROBIC AND ANAEROBIC Blood Culture adequate volume   Culture      NO GROWTH < 12 HOURS Performed at [facility] [address] [place], [place] [address]    Report Status PENDING   HIV Antibody (routine testing w rflx)     Status: None   Collection Time: 01/07/20  9:40 PM  Result Value Ref Range   HIV Screen 4th Generation wRfx Non Reactive Non Reactive    Comment: Performed at [facility], [place] [address], [place], [place] [address]  CBC WITH DIFFERENTIAL     Status: Abnormal   Collection Time: 01/07/20  9:40 PM  Result Value Ref Range   WBC 8.6 4.0 - 10.5 K/uL   RBC 3.58 (L) 4.22 - 5.81 MIL/uL   Hemoglobin 10.4 (L)  13.0 - 17.0 g/dL   HCT 31.7 (L) 39 - 52 %   MCV 88.5 80.0 - 100.0 fL   MCH 29.1 26.0 - 34.0 pg   MCHC 32.8 30.0 - 36.0 g/dL   RDW 13.4 11.5 - 15.5 %   Platelets 361 150 - 400 K/uL   nRBC 0.0 0.0 - 0.2 %   Neutrophils Relative % 65 %   Neutro Abs 5.6 1.7 - 7.7 K/uL   Lymphocytes Relative 18 %   Lymphs Abs 1.5 0.7 - 4.0 K/uL   Monocytes Relative 14 %   Monocytes Absolute 1.2 (H) 0.1 - 1.0 K/uL   Eosinophils Relative 2 %   Eosinophils Absolute 0.1 0.0 - 0.5 K/uL   Basophils Relative 1 %   Basophils Absolute 0.1 0.0 - 0.1 K/uL   Immature Granulocytes 0 %   Abs Immature Granulocytes 0.03 0.00 - 0.07 K/uL    Comment: Performed at [facility] [address] [place], [place] [address]  Comprehensive metabolic panel     Status: Abnormal   Collection Time: 01/07/20  9:40 PM  Result Value Ref Range   Sodium 130 (L) 135 - 145 mmol/L   Potassium 4.0 3.5 - 5.1  mmol/L   Chloride 98 98 - 111 mmol/L   CO2 23 22 - 32 mmol/L   Glucose, Bld 131 (H) 70 - 99 mg/dL    Comment: Glucose reference range applies only to samples taken after fasting for at least 8 hours.   BUN 19 8 - 23 mg/dL   Creatinine, Ser 1.40 (H) 0.61 - 1.24 mg/dL   Calcium 8.7 (L) 8.9 - 10.3 mg/dL   Total Protein 6.5 6.5 - 8.1 g/dL   Albumin 3.7 3.5 - 5.0 g/dL   AST 30 15 - 41 U/L   ALT 23 0 - 44 U/L   Alkaline Phosphatase 67 38 - 126 U/L   Total Bilirubin 0.4 0.3 - 1.2 mg/dL   GFR, Estimated 52 (L) >60 mL/min   Anion gap 9 5 - 15    Comment: Performed at [facility] [address] [place], [place] [address]  Culture, blood (routine x 2)     Status: None (Preliminary result)   Collection Time: 01/07/20  9:40 PM   Specimen: BLOOD LEFT HAND  Result Value Ref Range   Specimen Description BLOOD LEFT HAND    Special Requests      BOTTLES DRAWN AEROBIC AND ANAEROBIC Blood Culture adequate volume   Culture      NO GROWTH < 12 HOURS Performed at [facility], [place] [address], [place], [place] [address]    Report Status PENDING   Procalcitonin - Baseline     Status: None   Collection Time: 01/07/20  9:40 PM  Result Value Ref Range   Procalcitonin 0.14 ng/mL    Comment:        Interpretation: PCT (Procalcitonin) <= 0.5 ng/mL: Systemic infection (sepsis) is not likely. Local bacterial infection is possible. (NOTE)       Sepsis PCT Algorithm           Lower Respiratory Tract                                      Infection PCT Algorithm    ----------------------------     ----------------------------         PCT < 0.25 ng/mL  PCT < 0.10 ng/mL          Strongly encourage             Strongly discourage   discontinuation of antibiotics    initiation of antibiotics    ----------------------------     -----------------------------       PCT 0.25 - 0.50 ng/mL            PCT 0.10 - 0.25 ng/mL               OR       >80% decrease in PCT            Discourage  initiation of                                            antibiotics      Encourage discontinuation           of antibiotics    ----------------------------     -----------------------------         PCT >= 0.50 ng/mL              PCT 0.26 - 0.50 ng/mL               AND        <80% decrease in PCT             Encourage initiation of                                             antibiotics       Encourage continuation           of antibiotics    ----------------------------     -----------------------------        PCT >= 0.50 ng/mL                  PCT > 0.50 ng/mL               AND         increase in PCT                  Strongly encourage                                      initiation of antibiotics    Strongly encourage escalation           of antibiotics                                     -----------------------------                                           PCT <= 0.25 ng/mL                                                 OR                                        >  80% decrease in PCT                                      Discontinue / Do not initiate                                             antibiotics  Performed at [facility], [place] [address] [place], [place] [address]   Uric acid     Status: None   Collection Time: 01/07/20  9:40 PM  Result Value Ref Range   Uric Acid, Serum 6.0 3.7 - 8.6 mg/dL    Comment: Performed at [facility] [address], [place], [place] [address]  MRSA PCR Screening     Status: None   Collection Time: 01/07/20 10:01 PM   Specimen: Nasopharyngeal Wash  Result Value Ref Range   MRSA by PCR NEGATIVE NEGATIVE    Comment:        The GeneXpert MRSA Assay (FDA approved for NASAL specimens only), is one component of a comprehensive MRSA colonization surveillance program. It is not intended to diagnose MRSA infection nor to guide or monitor treatment for MRSA infections. Performed at [facility], [place] [address] [place], [place] [address]   Respiratory Panel by RT PCR (Flu A&B, Covid) - Nasopharyngeal Swab     Status: None   Collection Time: 01/07/20 10:01 PM   Specimen: Nasopharyngeal Swab  Result Value Ref Range   SARS Coronavirus 2 by RT PCR NEGATIVE NEGATIVE    Comment: (NOTE) SARS-CoV-2 target nucleic acids are NOT DETECTED.  The SARS-CoV-2 RNA is generally detectable in upper respiratoy specimens during the acute phase of infection. The lowest concentration of SARS-CoV-2 viral copies this assay can detect is 131 copies/mL. A negative result does not preclude SARS-Cov-2 infection and should not be used as the sole basis for treatment or other patient management decisions. A negative result may occur with  improper specimen collection/handling, submission of specimen other than nasopharyngeal swab, presence of viral mutation(s) within the areas targeted by this assay, and inadequate number of viral copies (<131 copies/mL). A negative result must be combined with clinical observations, patient history, and epidemiological information. The expected result is Negative.  Fact Sheet for Patients:  PinkCheek.be  Fact Sheet for Healthcare Providers:  GravelBags.it  This test is no t yet approved or cleared by the [place] FDA and  has been authorized for detection and/or diagnosis of SARS-CoV-2 by FDA under an Emergency Use Authorization (EUA). This EUA will remain  in effect (meaning this test can be used) for the duration of the COVID-19 declaration under Section 564(b)(1) of the Act, 21 U.S.C. section 360bbb-3(b)(1), unless the authorization is terminated or revoked sooner.     Influenza A by PCR NEGATIVE NEGATIVE   Influenza B by PCR NEGATIVE NEGATIVE    Comment: (NOTE) The Xpert Xpress SARS-CoV-2/FLU/RSV assay is intended as an aid in  the diagnosis of influenza from Nasopharyngeal swab specimens and  should not be used  as a sole basis for treatment. Nasal washings and  aspirates are unacceptable for Xpert Xpress SARS-CoV-2/FLU/RSV  testing.  Fact Sheet for Patients: PinkCheek.be  Fact Sheet for Healthcare Providers: GravelBags.it  This test is not yet approved or cleared by the [place]  States FDA and  has been authorized for detection and/or diagnosis of SARS-CoV-2 by  FDA under an Emergency Use Authorization (EUA). This EUA will remain  in effect (meaning this test can be used) for the duration of the  Covid-19 declaration under Section 564(b)(1) of the Act, 21  U.S.C. section 360bbb-3(b)(1), unless the authorization is  terminated or revoked. Performed at [facility], [place] [address] [place], [place] [address]   CBC     Status: Abnormal   Collection Time: 01/08/20 12:30 PM  Result Value Ref Range   WBC 12.1 (H) 4.0 - 10.5 K/uL   RBC 3.78 (L) 4.22 - 5.81 MIL/uL   Hemoglobin 10.9 (L) 13.0 - 17.0 g/dL   HCT 33.5 (L) 39 - 52 %   MCV 88.6 80.0 - 100.0 fL   MCH 28.8 26.0 - 34.0 pg   MCHC 32.5 30.0 - 36.0 g/dL   RDW 13.5 11.5 - 15.5 %   Platelets 401 (H) 150 - 400 K/uL   nRBC 0.0 0.0 - 0.2 %    Comment: Performed at [facility] [address] [place], [place] [address]  Basic metabolic panel     Status: Abnormal   Collection Time: 01/08/20 12:30 PM  Result Value Ref Range   Sodium 129 (L) 135 - 145 mmol/L   Potassium 4.3 3.5 - 5.1 mmol/L   Chloride 96 (L) 98 - 111 mmol/L   CO2 22 22 - 32 mmol/L   Glucose, Bld 127 (H) 70 - 99 mg/dL    Comment: Glucose reference range applies only to samples taken after fasting for at least 8 hours.   BUN 14 8 - 23 mg/dL   Creatinine, Ser 0.92 0.61 - 1.24 mg/dL   Calcium 9.3 8.9 - 10.3 mg/dL   GFR, Estimated >60 >60 mL/min   Anion gap 11 5 - 15    Comment: Performed at [facility] [address], [place], [place] [address]  Sedimentation rate     Status: Abnormal   Collection  Time: 01/08/20 12:30 PM  Result Value Ref Range   Sed Rate 44 (H) 0 - 16 mm/hr    Comment: Performed at [facility] [address], [place], [place] [address]  C-reactive protein     Status: Abnormal   Collection Time: 01/08/20 12:30 PM  Result Value Ref Range   CRP 3.7 (H) <1.0 mg/dL    Comment: Performed at [facility] [address] [place], [place] [address]    No results found.  Review of Systems  All other systems reviewed and are negative.  Blood pressure (!) 175/102, pulse 91, temperature 97.7 F (36.5 C), temperature source Oral, resp. rate 18, height 6' 3.98" (1.93 m), weight 84.1 kg, SpO2 99 %. Physical Exam Constitutional:      Appearance: Normal appearance.  HENT:     Head: Normocephalic and atraumatic.  Eyes:     Pupils: Pupils are equal, round, and reactive to light.  Cardiovascular:     Rate and Rhythm: Normal rate and regular rhythm.  Pulmonary:     Effort: Pulmonary effort is normal.  Musculoskeletal:     Right wrist: Swelling, effusion and tenderness present. Decreased range of motion.     Cervical back: Normal range of motion and neck supple.     Comments: Right volar forearm wrist and thumb pain, swelling, and erythema  Skin:    General: Skin is warm.  Findings: Erythema present.  Neurological:     General: No focal deficit present.     Mental Status: He is alert and oriented to person, place, and time.  Psychiatric:        Mood and Affect: Mood normal.        Behavior: Behavior normal.        Thought Content: Thought content normal.        Judgment: Judgment normal.     Assessment/Plan: 64 year old male with probable deep infection volar aspect wrist and hand and thumb on the right despite IV antibiotics and worsening clinical picture with increasing white count.  Have discussed the role of incision and drainage with culturing of this probable deep abscess volar forearm and wrist.  Patient understands risks and benefits and  wishes to proceed.  Sheral Apley [facility] 01/08/2020, 3:23 PM

## 2020-01-08 NOTE — Plan of Care (Signed)
°  Problem: Coping: °Goal: Level of anxiety will decrease °Outcome: Progressing °  °

## 2020-01-08 NOTE — Anesthesia Preprocedure Evaluation (Addendum)
Anesthesia Evaluation  Patient identified by MRN, date of birth, ID band Patient awake    Reviewed: Allergy & Precautions, H&P , NPO status , Patient's Chart, lab work & pertinent test results  Airway Mallampati: II   Neck ROM: full    Dental   Pulmonary former smoker,    breath sounds clear to auscultation       Cardiovascular + Peripheral Vascular Disease   Rhythm:regular Rate:Normal     Neuro/Psych  Headaches,    GI/Hepatic GERD  ,  Endo/Other    Renal/GU Renal diseasestones     Musculoskeletal  (+) Arthritis ,   Abdominal   Peds  Hematology   Anesthesia Other Findings   Reproductive/Obstetrics                            Anesthesia Physical Anesthesia Plan  ASA: II  Anesthesia Plan: General   Post-op Pain Management:    Induction: Intravenous  PONV Risk Score and Plan: 2 and Ondansetron, Dexamethasone, Midazolam and Treatment may vary due to age or medical condition  Airway Management Planned: LMA  Additional Equipment:   Intra-op Plan:   Post-operative Plan: Extubation in OR  Informed Consent: I have reviewed the patients History and Physical, chart, labs and discussed the procedure including the risks, benefits and alternatives for the proposed anesthesia with the patient or authorized representative who has indicated his/her understanding and acceptance.       Plan Discussed with: CRNA, Anesthesiologist and Surgeon  Anesthesia Plan Comments:         Anesthesia Quick Evaluation

## 2020-01-08 NOTE — Op Note (Signed)
Patient was taken to the operating suite and after the induction of adequate general anesthetic right upper extremity was prepped and draped in usual sterile fashion.  An Esmarch was used to exsanguinate the limb and the tourniquet was inflated to 250 mmHg.  This point time incision made in the distal forearm and wrist area paralleling the palmar longus tendon crossing the carpal tunnel in [place] fashion going to the mid palm.  Dissection was carried down to the skin subtenons tissues significant edematous type fluid was encountered and dissection was carried down to the median nerve at the distal forearm and wrist area was traced in the carpal canal the carpal canal was released.  There was significant fluid in the carpal canal which was cultured and also sent for gout crystals.  We dissected down to the distal edge of the transverse carpal carpal ligament and into the mid palm area involving the index and long fingers.  The flexor sheaths were examined.  There was significant hypertrophic synovium around both the flexor digitorum superficialis tendons which were debrided.  We carefully retracted the median nerve and performed both superficial and deep flexor synovectomy is for significant flexor synovitis involving both the superficial and deep flexors.  This is all sent for pathologic confirmation, gout crystals, as well as culturing.  The wound was then thoroughly irrigated with 3 L of normal saline and loosely closed with 3-0 nylon interrupted with quarter inch iodoform gauze packed into the proximal and distal aspects of the wound.  We then injected 4% plain Marcaine and dressed with Xeroform, 4 x 4's, and a compression bandage and palmar splint.  The patient tolerated this procedure well went to come in stable fashion.

## 2020-01-08 NOTE — Anesthesia Postprocedure Evaluation (Signed)
Anesthesia Post Note  Patient: Joseph [name]  Procedure(s) Performed: IRRIGATION AND DEBRIDEMENT RIGHT HAND (Right )     Patient location during evaluation: PACU Anesthesia Type: General Level of consciousness: awake Pain management: pain level controlled Vital Signs Assessment: post-procedure vital signs reviewed and stable Respiratory status: spontaneous breathing Cardiovascular status: stable Postop Assessment: no apparent nausea or vomiting Anesthetic complications: no   No complications documented.  Last Vitals:  Vitals:   01/08/20 1753 01/08/20 1809  BP: (!) 192/86 (!) 175/95  Pulse: (!) 109 (!) 111  Resp: (!) 27 (!) 22  Temp:    SpO2: 92% 100%    Last Pain:  Vitals:   01/08/20 1708  TempSrc:   PainSc: Asleep                 Haruto Demaria

## 2020-01-08 NOTE — Progress Notes (Addendum)
[facility] PROGRESS NOTE   Joseph [name] Sensing TZG:[id number] DOB: 10-03-1955 DOA: 01/07/2020  PCP: Denita Lung, MD  Brief History/Interval Summary: 64 y.o. male with medical history significant of DDD, GERD. Pt had cut over thumb x3 weeks ago.  Since that time he has had progressively progressive erythema, edema, pain.  Starting in thumb but eventually extending down thumb into wrist and now up forearm.  Presented to PCP who referred him to Dr. Bertis Ruddy office.  It was felt that patient had cellulitis of his right forearm and hand.  He was directly admitted to the hospital for further management.  Reason for Visit: Cellulitis involving right hand and forearm  Consultants: Dr. Burney Gauze with hand surgery  Procedures: None yet  Antibiotics: Anti-infectives (From admission, onward)   Start     Dose/Rate Route Frequency Ordered Stop   01/07/20 2145  cefTRIAXone (ROCEPHIN) 1 g in sodium chloride 0.9 % 100 mL IVPB        1 g 200 mL/hr over 30 Minutes Intravenous Every 24 hours 01/07/20 2051        Subjective/Interval History: Patient continues to complain of pain in his right hand and forearm.  Currently 7 out of 10 in intensity.  Swelling has worsened compared to yesterday.  Denies any tingling numbness.     Assessment/Plan:  Cellulitis involving the right hand and forearm Patient continues to have some erythema.  Swelling apparently has worsened compared to yesterday.  He has good radial pulses.  He does have mobility of his fingers.  Patient currently on ceftriaxone.  Considering worsening in his swelling may need to broaden antibiotic spectrum.  Will add vancomycin.  WBC however is noted to be normal as of yesterday evening.  Acute inflammation is another differential.  Continue with pain medications.  Increase frequency of hydromorphone.  I have contacted Dr. Bertis Ruddy office to have them reevaluate the patient.  Keep n.p.o. for now in case he needs surgery later today.   Uric acid level 6.0.  Procalcitonin 0.14.  Labs were repeated today.  WBC is gone up to 12.1.  Discussed with Dr. Burney Gauze again who has evaluated the patient.  In view of new leukocytosis he plans to take the patient to the OR.  CRP noted to be 3.7.  Normocytic anemia Hemoglobin noted to be 10.4 yesterday evening.  We will recheck it tomorrow.  Check anemia panel.  Hyponatremia/acute renal failure Sodium noted to be 129 today.  Creatinine improved to 0.9.  Start IV fluids.  DVT Prophylaxis: Lovenox Code Status: Full code Family Communication: Discussed with the patient and his wife Disposition Plan: Hopefully return home in improved  Status is: Observation  The patient will require care spanning > 2 midnights and should be moved to inpatient because: IV treatments appropriate due to intensity of illness or inability to take PO and Inpatient level of care appropriate due to severity of illness  Dispo: The patient is from: Home              Anticipated d/c is to: Home              Anticipated d/c date is: 1 day              Patient currently is not medically stable to d/c.     Medications:  Scheduled: . enoxaparin (LOVENOX) injection  40 mg Subcutaneous Q24H  . [START ON 01/09/2020] influenza vaccine adjuvanted  0.5 mL Intramuscular Tomorrow-1000  . nortriptyline  50 mg Oral  QHS   Continuous: . cefTRIAXone (ROCEPHIN)  IV 1 g (01/07/20 2130)   WRU:[id number] **OR** acetaminophen, HYDROmorphone (DILAUDID) injection, ondansetron **OR** ondansetron (ZOFRAN) IV   Objective:  Vital Signs  Vitals:   01/07/20 1936 01/07/20 2338 01/08/20 0344 01/08/20 0743  BP: 114/73 (!) 148/101 (!) 181/91 (!) 168/96  Pulse: 93 84 81 94  Resp: 18 18 18 18   Temp: 99.3 F (37.4 C) 98.8 F (37.1 C) 98.3 F (36.8 C) 98.2 F (36.8 C)  TempSrc: Oral Oral Oral Oral  SpO2: 97% 98% 99% 96%  Weight: 84.1 kg     Height: 6\' 4"  (1.93 m)      No intake or output data in the 24 hours ending  01/08/20 1058 Filed Weights   01/07/20 1936  Weight: 84.1 kg    General appearance: Awake alert.  In no distress Resp: Clear to auscultation bilaterally.  Normal effort Cardio: S1-S2 is normal regular.  No S3-S4.  No rubs murmurs or bruit GI: Abdomen is soft.  Nontender nondistended.  Bowel sounds are present normal.  No masses organomegaly Extremities: Swelling of the right hand noted along with swelling of the distal forearm.  Good radial pulses.  Mild erythema.  Mildly warm to touch compared to the left hand. Neurologic: Alert and oriented x3.  No focal neurological deficits.    Lab Results:  Data Reviewed: I have personally reviewed following labs and imaging studies  CBC: Recent Labs  Lab 01/07/20 2140  WBC 8.6  NEUTROABS 5.6  HGB 10.4*  HCT 31.7*  MCV 88.5  PLT 829    Basic Metabolic Panel: Recent Labs  Lab 01/07/20 2140  NA 130*  K 4.0  CL 98  CO2 23  GLUCOSE 131*  BUN 19  CREATININE 1.40*  CALCIUM 8.7*    GFR: Estimated Creatinine Clearance: 62.6 mL/min (A) (by C-G formula based on SCr of 1.4 mg/dL (H)).  Liver Function Tests: Recent Labs  Lab 01/07/20 2140  AST 30  ALT 23  ALKPHOS 67  BILITOT 0.4  PROT 6.5  ALBUMIN 3.7      Recent Results (from the past 240 hour(s))  Culture, blood (routine x 2)     Status: None (Preliminary result)   Collection Time: 01/07/20  9:39 PM   Specimen: BLOOD  Result Value Ref Range Status   Specimen Description BLOOD RIGHT ANTECUBITAL  Final   Special Requests   Final    BOTTLES DRAWN AEROBIC AND ANAEROBIC Blood Culture adequate volume   Culture   Final    NO GROWTH < 12 HOURS Performed at [facility], [place] [address] [place], [place] [address]    Report Status PENDING  Incomplete  Culture, blood (routine x 2)     Status: None (Preliminary result)   Collection Time: 01/07/20  9:40 PM   Specimen: BLOOD LEFT HAND  Result Value Ref Range Status   Specimen Description BLOOD LEFT HAND  Final    Special Requests   Final    BOTTLES DRAWN AEROBIC AND ANAEROBIC Blood Culture adequate volume   Culture   Final    NO GROWTH < 12 HOURS Performed at [facility], [place] [address], [place], [place] [address]    Report Status PENDING  Incomplete  MRSA PCR Screening     Status: None   Collection Time: 01/07/20 10:01 PM   Specimen: Nasopharyngeal Wash  Result Value Ref Range Status   MRSA by PCR NEGATIVE NEGATIVE Final    Comment:  The GeneXpert MRSA Assay (FDA approved for NASAL specimens only), is one component of a comprehensive MRSA colonization surveillance program. It is not intended to diagnose MRSA infection nor to guide or monitor treatment for MRSA infections. Performed at [facility], [place] [address], [place], [place] [address]   Respiratory Panel by RT PCR (Flu A&B, Covid) - Nasopharyngeal Swab     Status: None   Collection Time: 01/07/20 10:01 PM   Specimen: Nasopharyngeal Swab  Result Value Ref Range Status   SARS Coronavirus 2 by RT PCR NEGATIVE NEGATIVE Final    Comment: (NOTE) SARS-CoV-2 target nucleic acids are NOT DETECTED.  The SARS-CoV-2 RNA is generally detectable in upper respiratoy specimens during the acute phase of infection. The lowest concentration of SARS-CoV-2 viral copies this assay can detect is 131 copies/mL. A negative result does not preclude SARS-Cov-2 infection and should not be used as the sole basis for treatment or other patient management decisions. A negative result may occur with  improper specimen collection/handling, submission of specimen other than nasopharyngeal swab, presence of viral mutation(s) within the areas targeted by this assay, and inadequate number of viral copies (<131 copies/mL). A negative result must be combined with clinical observations, patient history, and epidemiological information. The expected result is Negative.  Fact Sheet for Patients:  PinkCheek.be  Fact  Sheet for Healthcare Providers:  GravelBags.it  This test is no t yet approved or cleared by the [place] FDA and  has been authorized for detection and/or diagnosis of SARS-CoV-2 by FDA under an Emergency Use Authorization (EUA). This EUA will remain  in effect (meaning this test can be used) for the duration of the COVID-19 declaration under Section 564(b)(1) of the Act, 21 U.S.C. section 360bbb-3(b)(1), unless the authorization is terminated or revoked sooner.     Influenza A by PCR NEGATIVE NEGATIVE Final   Influenza B by PCR NEGATIVE NEGATIVE Final    Comment: (NOTE) The Xpert Xpress SARS-CoV-2/FLU/RSV assay is intended as an aid in  the diagnosis of influenza from Nasopharyngeal swab specimens and  should not be used as a sole basis for treatment. Nasal washings and  aspirates are unacceptable for Xpert Xpress SARS-CoV-2/FLU/RSV  testing.  Fact Sheet for Patients: PinkCheek.be  Fact Sheet for Healthcare Providers: GravelBags.it  This test is not yet approved or cleared by the [place] FDA and  has been authorized for detection and/or diagnosis of SARS-CoV-2 by  FDA under an Emergency Use Authorization (EUA). This EUA will remain  in effect (meaning this test can be used) for the duration of the  Covid-19 declaration under Section 564(b)(1) of the Act, 21  U.S.C. section 360bbb-3(b)(1), unless the authorization is  terminated or revoked. Performed at [facility], [place] [address], [place], [place] [address]       Radiology Studies: No results found.     LOS: 0 days   Virginio Isidore [employer] on [email or web address]  01/08/2020, 10:58 AM

## 2020-01-08 NOTE — Anesthesia Procedure Notes (Signed)
Procedure Name: LMA Insertion Performed by: Milford Cage, CRNA Pre-anesthesia Checklist: Patient identified, Emergency Drugs available, Suction available and Patient being monitored Patient Re-evaluated:Patient Re-evaluated prior to induction Oxygen Delivery Method: Circle System Utilized Preoxygenation: Pre-oxygenation with 100% oxygen Induction Type: IV induction Ventilation: Mask ventilation without difficulty LMA: LMA inserted LMA Size: 4.0 Number of attempts: 2 Airway Equipment and Method: Bite block Placement Confirmation: positive ETCO2 Tube secured with: Tape Dental Injury: Teeth and Oropharynx as per pre-operative assessment and Injury to lip

## 2020-01-08 NOTE — Brief Op Note (Signed)
01/08/2020  4:55 PM  PATIENT:  Joseph [name]  64 y.o. male  PRE-OPERATIVE DIAGNOSIS:  Right Hand Infection  POST-OPERATIVE DIAGNOSIS:  Right Hand Infection  PROCEDURE:  Procedure(s): IRRIGATION AND DEBRIDEMENT RIGHT HAND (Right)  SURGEON:  Surgeon(s) and Role:    Charlotte Crumb, MD - Primary  PHYSICIAN ASSISTANT:   ASSISTANTS: none   ANESTHESIA:   general  EBL: Minimal BLOOD ADMINISTERED:none  DRAINS: none   LOCAL MEDICATIONS USED:  MARCAINE     SPECIMEN:  Biopsy / Limited Resection and Lavage/Washing  DISPOSITION OF SPECIMEN:  PATHOLOGY  COUNTS:  YES  TOURNIQUET:  * Missing tourniquet times found for documented tourniquets in log: [id number] *  DICTATION: .Viviann Spare Dictation  PLAN OF CARE: Admit to inpatient   PATIENT DISPOSITION:  PACU - hemodynamically stable.   Delay start of Pharmacological VTE agent (>24hrs) due to surgical blood loss or risk of bleeding: yes

## 2020-01-08 NOTE — Transfer of Care (Signed)
Immediate Anesthesia Transfer of Care Note  Patient: Joseph [name]  Procedure(s) Performed: IRRIGATION AND DEBRIDEMENT RIGHT HAND (Right )  Patient Location: PACU  Anesthesia Type:General  Level of Consciousness: drowsy  Airway & Oxygen Therapy: Patient Spontanous Breathing  Post-op Assessment: Report given to RN and Post -op Vital signs reviewed and stable  Post vital signs: Reviewed and stable  Last Vitals:  Vitals Value Taken Time  BP 132/62 01/08/20 1708  Temp    Pulse 57 01/08/20 1709  Resp 7 01/08/20 1709  SpO2 95 % 01/08/20 1709  Vitals shown include unvalidated device data.  Last Pain:  Vitals:   01/08/20 1228  TempSrc:   PainSc: 7       Patients Stated Pain Goal: 5 (82/80/03 4917)  Complications: No complications documented.

## 2020-01-08 NOTE — Progress Notes (Signed)
Pharmacy Antibiotic Note  Joseph [name] is a 64 y.o. male directly admitted from PCP on 01/07/2020 with RUE cellulitis.  Pharmacy has been consulted for vancomycin dosing.  WBC wnl, afebrile. Possible surgery. ClCr ~62 ml/min.  Plan: Vancomycin 1.5g x1 then 1g q12hr  Monitor cultures, clinical status, renal fx, vanc level  Narrow abx as able and f/u duration   Height: 6\' 4"  (193 cm) Weight: 84.1 kg (185 lb 4.8 oz) IBW/kg (Calculated) : 86.8  Temp (24hrs), Avg:98.7 F (37.1 C), Min:98.2 F (36.8 C), Max:99.3 F (37.4 C)  Recent Labs  Lab 01/07/20 2140  WBC 8.6  CREATININE 1.40*    Estimated Creatinine Clearance: 62.6 mL/min (A) (by C-G formula based on SCr of 1.4 mg/dL (H)).    Allergies  Allergen Reactions  . Daypro [Oxaprozin]     GASTRIC PROBLEMS  . Methadone Itching    Antimicrobials this admission: Vanc 10/15 >>  CTX 10/15 >>    Microbiology results: 10/14 BCx: ngtd  10/14 MRSA PCR: neg  Thank you for allowing pharmacy to be a part of this patient's care.  Benetta Spar, PharmD, BCPS, BCCP Clinical Pharmacist  Please check AMION for all [place] phone numbers After 10:00 PM, call [place] [phone]

## 2020-01-09 ENCOUNTER — Encounter (HOSPITAL_COMMUNITY): Payer: Self-pay | Admitting: Orthopedic Surgery

## 2020-01-09 DIAGNOSIS — D509 Iron deficiency anemia, unspecified: Secondary | ICD-10-CM

## 2020-01-09 DIAGNOSIS — L03113 Cellulitis of right upper limb: Secondary | ICD-10-CM | POA: Diagnosis not present

## 2020-01-09 LAB — IRON AND TIBC
Iron: 19 ug/dL — ABNORMAL LOW (ref 45–182)
Saturation Ratios: 4 % — ABNORMAL LOW (ref 17.9–39.5)
TIBC: 461 ug/dL — ABNORMAL HIGH (ref 250–450)
UIBC: 442 ug/dL

## 2020-01-09 LAB — COMPREHENSIVE METABOLIC PANEL
ALT: 23 U/L (ref 0–44)
AST: 27 U/L (ref 15–41)
Albumin: 3.4 g/dL — ABNORMAL LOW (ref 3.5–5.0)
Alkaline Phosphatase: 68 U/L (ref 38–126)
Anion gap: 12 (ref 5–15)
BUN: 17 mg/dL (ref 8–23)
CO2: 25 mmol/L (ref 22–32)
Calcium: 9.3 mg/dL (ref 8.9–10.3)
Chloride: 99 mmol/L (ref 98–111)
Creatinine, Ser: 1.17 mg/dL (ref 0.61–1.24)
GFR, Estimated: >60 mL/min (ref >60)
Glucose, Bld: 150 mg/dL — ABNORMAL HIGH (ref 70–99)
Potassium: 4.6 mmol/L (ref 3.5–5.1)
Sodium: 136 mmol/L (ref 135–145)
Total Bilirubin: 0.5 mg/dL (ref 0.3–1.2)
Total Protein: 6.5 g/dL (ref 6.5–8.1)

## 2020-01-09 LAB — CBC
HCT: 33.1 % — ABNORMAL LOW (ref 39.0–52.0)
Hemoglobin: 10.6 g/dL — ABNORMAL LOW (ref 13.0–17.0)
MCH: 29 pg (ref 26.0–34.0)
MCHC: 32 g/dL (ref 30.0–36.0)
MCV: 90.4 fL (ref 80.0–100.0)
Platelets: 371 10*3/uL (ref 150–400)
RBC: 3.66 MIL/uL — ABNORMAL LOW (ref 4.22–5.81)
RDW: 13.6 % (ref 11.5–15.5)
WBC: 11.2 10*3/uL — ABNORMAL HIGH (ref 4.0–10.5)
nRBC: 0 % (ref 0.0–0.2)

## 2020-01-09 LAB — FOLATE: Folate: 15.5 ng/mL (ref >5.9)

## 2020-01-09 LAB — RETICULOCYTES
Immature Retic Fract: 2.3 % (ref 2.3–15.9)
RBC.: 3.62 MIL/uL — ABNORMAL LOW (ref 4.22–5.81)
Retic Count, Absolute: 27.9 10*3/uL (ref 19.0–186.0)
Retic Ct Pct: 0.8 % (ref 0.4–3.1)

## 2020-01-09 LAB — FERRITIN: Ferritin: 28 ng/mL (ref 24–336)

## 2020-01-09 LAB — VITAMIN B12: Vitamin B-12: 500 pg/mL (ref 180–914)

## 2020-01-09 NOTE — Progress Notes (Signed)
Patient seen and examined at bedside this morning.  Patient much improved with regards to pain and swelling.  Significant decrease in median nerve symptomatology.  Patient states overall he is much improved.  White blood cell count decreasing to 11,000 this morning.  Gram stain from the operating room showed no organisms.  Awaiting urate crystal results.  Also awaiting fungal and mycobacterial and AFB cultures.  Have discussed with the patient and his family as well as medical service continuation of intravenous antibiotics and probable discharge tomorrow morning on p.o. antibiotics and possible steroids depending on white count.  We will see this patient in my office Monday for dressing change, packing removal, initiation of wound care.

## 2020-01-09 NOTE — Progress Notes (Signed)
[facility] PROGRESS NOTE   Gareld Obrecht Mounsey MVE:[id number] DOB: 1955-10-26 DOA: 01/07/2020  PCP: Denita Lung, MD  Brief History/Interval Summary: 64 y.o. male with medical history significant of DDD, GERD. Pt had cut over thumb x3 weeks ago.  Since that time he has had progressively progressive erythema, edema, pain.  Starting in thumb but eventually extending down thumb into wrist and now up forearm.  Presented to PCP who referred him to Dr. Bertis Ruddy office.  It was felt that patient had cellulitis of his right forearm and hand.  He was directly admitted to the hospital for further management.  Reason for Visit: Cellulitis involving right hand and forearm  Consultants: Dr. Burney Gauze with hand surgery  Procedures: Irrigation and debridement of the right hand on 10/15  Antibiotics: Anti-infectives (From admission, onward)   Start     Dose/Rate Route Frequency Ordered Stop   01/09/20 0000  vancomycin (VANCOCIN) IVPB 1000 mg/200 mL premix        1,000 mg 200 mL/hr over 60 Minutes Intravenous Every 12 hours 01/08/20 1123     01/08/20 1215  vancomycin (VANCOREADY) IVPB 1500 mg/300 mL        1,500 mg 150 mL/hr over 120 Minutes Intravenous  Once 01/08/20 1123 01/08/20 1453   01/08/20 1200  vancomycin (VANCOCIN) IVPB 1000 mg/200 mL premix  Status:  Discontinued        1,000 mg 200 mL/hr over 60 Minutes Intravenous Every 12 hours 01/08/20 1121 01/08/20 1123   01/07/20 2145  cefTRIAXone (ROCEPHIN) 1 g in sodium chloride 0.9 % 100 mL IVPB        1 g 200 mL/hr over 30 Minutes Intravenous Every 24 hours 01/07/20 2051        Subjective/Interval History: Patient states that his hand pain appears to be improving.  He is able to move his fingers.  Denies any tingling or numbness at this time.  No other complaints offered.       Assessment/Plan:  Swelling and pain in the right hand and distal forearm  Concern was for cellulitis/infection.  Patient underwent irrigation and  debridement yesterday.  No evidence for abscess noted.  There was significant fluid in the carpal canal.  Cultures were sent.  Hypertrophic synovium was also noted. Discussed with Dr. Burney Gauze.  Findings are somewhat atypical for bacterial infection.  Patient does admit to working in the garden and recently planted a rose bush.  Fungal infections need to be in the differential.  Mycobacterium is in the differential.  WBC noted to be slightly better today compared to yesterday.  Plan is to continue current antibiotics for another 24 hours.  Recheck labs tomorrow.  If there is improvement then will transition to oral antibacterials tomorrow and potentially he can go home then.  Follow-up on cultures.  Gram stain did not show any organism.  Acute inflammation is also in the differential.  May need to consider steroids.  Will wait on culture data before deciding. Uric acid level was 6.0.  CRP was 3.7.  ESR 44.  Iron deficiency anemia  Hemoglobin is low but stable.  Ferritin 28, iron 19, TIBC 461, percent saturation 4.  Folic acid 28.3.  B12 500.  Patient has iron deficiency anemia.  We will start him on iron supplementation at discharge.  He will need further work-up in the outpatient setting for same.    Hyponatremia/acute renal failure Renal function has improved.  Sodium is also better.  Cut back on IV fluids.  DVT Prophylaxis: Lovenox Code Status: Full code Family Communication: Discussed with patient and his daughter Disposition Plan: Hopefully back home in the next 24 hours.  Status is: Inpatient  Remains inpatient appropriate because:Ongoing active pain requiring inpatient pain management and IV treatments appropriate due to intensity of illness or inability to take PO   Dispo: The patient is from: Home              Anticipated d/c is to: Home              Anticipated d/c date is: 1 day              Patient currently is not medically stable to d/c.      Medications:  Scheduled: .  enoxaparin (LOVENOX) injection  40 mg Subcutaneous Q24H  . influenza vaccine adjuvanted  0.5 mL Intramuscular Tomorrow-1000  . nortriptyline  50 mg Oral QHS   Continuous: . sodium chloride    . cefTRIAXone (ROCEPHIN)  IV 1 g (01/08/20 2127)  . vancomycin 1,000 mg (01/08/20 2356)   VZC:HYIFOYDXAJOIN **OR** acetaminophen, carisoprodol, HYDROcodone-acetaminophen, HYDROmorphone (DILAUDID) injection, ondansetron **OR** ondansetron (ZOFRAN) IV   Objective:  Vital Signs  Vitals:   01/08/20 2358 01/09/20 0107 01/09/20 0622 01/09/20 1042  BP: (!) 163/74 (!) 141/75 (!) 153/84 (!) 168/83  Pulse:  92 91 95  Resp:      Temp:   (!) 97.4 F (36.3 C) 98.4 F (36.9 C)  TempSrc:    Oral  SpO2:   97% 95%  Weight:      Height:        Intake/Output Summary (Last 24 hours) at 01/09/2020 1054 Last data filed at 01/08/2020 1800 Gross per 24 hour  Intake 500 ml  Output 510 ml  Net -10 ml   Filed Weights   01/07/20 1936 01/08/20 1508  Weight: 84.1 kg 84.1 kg    General appearance: Awake alert.  In no distress Resp: Clear to auscultation bilaterally.  Normal effort Cardio: S1-S2 is normal regular.  No S3-S4.  No rubs murmurs or bruit GI: Abdomen is soft.  Nontender nondistended.  Bowel sounds are present normal.  No masses organomegaly Extremities: Right upper extremity covered in dressing.  Able to move his fingers. Neurologic: Alert and oriented x3.  No focal neurological deficits.     Lab Results:  Data Reviewed: I have personally reviewed following labs and imaging studies  CBC: Recent Labs  Lab 01/07/20 2140 01/08/20 1230 01/09/20 0135  WBC 8.6 12.1* 11.2*  NEUTROABS 5.6  --   --   HGB 10.4* 10.9* 10.6*  HCT 31.7* 33.5* 33.1*  MCV 88.5 88.6 90.4  PLT 361 401* 867    Basic Metabolic Panel: Recent Labs  Lab 01/07/20 2140 01/08/20 1230 01/09/20 0135  NA 130* 129* 136  K 4.0 4.3 4.6  CL 98 96* 99  CO2 23 22 25   GLUCOSE 131* 127* 150*  BUN 19 14 17   CREATININE  1.40* 0.92 1.17  CALCIUM 8.7* 9.3 9.3    GFR: Estimated Creatinine Clearance: 74.9 mL/min (by C-G formula based on SCr of 1.17 mg/dL).  Liver Function Tests: Recent Labs  Lab 01/07/20 2140 01/09/20 0135  AST 30 27  ALT 23 23  ALKPHOS 67 68  BILITOT 0.4 0.5  PROT 6.5 6.5  ALBUMIN 3.7 3.4*      Recent Results (from the past 240 hour(s))  Culture, blood (routine x 2)     Status: None (Preliminary result)   Collection Time:  01/07/20  9:39 PM   Specimen: BLOOD  Result Value Ref Range Status   Specimen Description BLOOD RIGHT ANTECUBITAL  Final   Special Requests   Final    BOTTLES DRAWN AEROBIC AND ANAEROBIC Blood Culture adequate volume   Culture   Final    NO GROWTH 2 DAYS Performed at [facility], [address] [place], [place] [address]    Report Status PENDING  Incomplete  Culture, blood (routine x 2)     Status: None (Preliminary result)   Collection Time: 01/07/20  9:40 PM   Specimen: BLOOD LEFT HAND  Result Value Ref Range Status   Specimen Description BLOOD LEFT HAND  Final   Special Requests   Final    BOTTLES DRAWN AEROBIC AND ANAEROBIC Blood Culture adequate volume   Culture   Final    NO GROWTH 2 DAYS Performed at [facility], [place] [address] [place], [place] [address]    Report Status PENDING  Incomplete  MRSA PCR Screening     Status: None   Collection Time: 01/07/20 10:01 PM   Specimen: Nasopharyngeal Wash  Result Value Ref Range Status   MRSA by PCR NEGATIVE NEGATIVE Final    Comment:        The GeneXpert MRSA Assay (FDA approved for NASAL specimens only), is one component of a comprehensive MRSA colonization surveillance program. It is not intended to diagnose MRSA infection nor to guide or monitor treatment for MRSA infections. Performed at [facility], [place] [address] [place], [place] [address]   Respiratory Panel by RT PCR (Flu A&B, Covid) - Nasopharyngeal Swab     Status: None   Collection Time: 01/07/20 10:01 PM    Specimen: Nasopharyngeal Swab  Result Value Ref Range Status   SARS Coronavirus 2 by RT PCR NEGATIVE NEGATIVE Final    Comment: (NOTE) SARS-CoV-2 target nucleic acids are NOT DETECTED.  The SARS-CoV-2 RNA is generally detectable in upper respiratoy specimens during the acute phase of infection. The lowest concentration of SARS-CoV-2 viral copies this assay can detect is 131 copies/mL. A negative result does not preclude SARS-Cov-2 infection and should not be used as the sole basis for treatment or other patient management decisions. A negative result may occur with  improper specimen collection/handling, submission of specimen other than nasopharyngeal swab, presence of viral mutation(s) within the areas targeted by this assay, and inadequate number of viral copies (<131 copies/mL). A negative result must be combined with clinical observations, patient history, and epidemiological information. The expected result is Negative.  Fact Sheet for Patients:  PinkCheek.be  Fact Sheet for Healthcare Providers:  GravelBags.it  This test is no t yet approved or cleared by the [place] FDA and  has been authorized for detection and/or diagnosis of SARS-CoV-2 by FDA under an Emergency Use Authorization (EUA). This EUA will remain  in effect (meaning this test can be used) for the duration of the COVID-19 declaration under Section 564(b)(1) of the Act, 21 U.S.C. section 360bbb-3(b)(1), unless the authorization is terminated or revoked sooner.     Influenza A by PCR NEGATIVE NEGATIVE Final   Influenza B by PCR NEGATIVE NEGATIVE Final    Comment: (NOTE) The Xpert Xpress SARS-CoV-2/FLU/RSV assay is intended as an aid in  the diagnosis of influenza from Nasopharyngeal swab specimens and  should not be used as a sole basis for treatment. Nasal washings and  aspirates are unacceptable for Xpert Xpress SARS-CoV-2/FLU/RSV   testing.  Fact  Sheet for Patients: PinkCheek.be  Fact Sheet for Healthcare Providers: GravelBags.it  This test is not yet approved or cleared by the [place] FDA and  has been authorized for detection and/or diagnosis of SARS-CoV-2 by  FDA under an Emergency Use Authorization (EUA). This EUA will remain  in effect (meaning this test can be used) for the duration of the  Covid-19 declaration under Section 564(b)(1) of the Act, 21  U.S.C. section 360bbb-3(b)(1), unless the authorization is  terminated or revoked. Performed at [facility], [place] [address], [place], [place] [address]   Aerobic/Anaerobic Culture (surgical/deep wound)     Status: None (Preliminary result)   Collection Time: 01/08/20  4:20 PM   Specimen: Soft Tissue, Other  Result Value Ref Range Status   Specimen Description ABSCESS RIGHT HAND  Final   Special Requests NONE  Final   Gram Stain   Final    ABUNDANT WBC PRESENT, PREDOMINANTLY PMN NO ORGANISMS SEEN    Culture   Final    NO GROWTH < 24 HOURS Performed at [facility], [place] [address] [place], [place] [address]    Report Status PENDING  Incomplete      Radiology Studies: No results found.     LOS: 1 day   Donivin Wirt [employer] on [email or web address]  01/09/2020, 10:54 AM

## 2020-01-10 DIAGNOSIS — L03113 Cellulitis of right upper limb: Secondary | ICD-10-CM | POA: Diagnosis not present

## 2020-01-10 LAB — COMPREHENSIVE METABOLIC PANEL
ALT: 45 U/L — ABNORMAL HIGH (ref 0–44)
AST: 58 U/L — ABNORMAL HIGH (ref 15–41)
Albumin: 2.8 g/dL — ABNORMAL LOW (ref 3.5–5.0)
Alkaline Phosphatase: 73 U/L (ref 38–126)
Anion gap: 8 (ref 5–15)
BUN: 16 mg/dL (ref 8–23)
CO2: 24 mmol/L (ref 22–32)
Calcium: 8.1 mg/dL — ABNORMAL LOW (ref 8.9–10.3)
Chloride: 100 mmol/L (ref 98–111)
Creatinine, Ser: 0.95 mg/dL (ref 0.61–1.24)
GFR, Estimated: >60 mL/min (ref >60)
Glucose, Bld: 132 mg/dL — ABNORMAL HIGH (ref 70–99)
Potassium: 3.9 mmol/L (ref 3.5–5.1)
Sodium: 132 mmol/L — ABNORMAL LOW (ref 135–145)
Total Bilirubin: 0.5 mg/dL (ref 0.3–1.2)
Total Protein: 5.9 g/dL — ABNORMAL LOW (ref 6.5–8.1)

## 2020-01-10 LAB — GLUCOSE, CAPILLARY
Glucose-Capillary: 230 mg/dL — ABNORMAL HIGH (ref 70–99)
Glucose-Capillary: 149 mg/dL — ABNORMAL HIGH (ref 70–99)

## 2020-01-10 LAB — CBC
HCT: 29.9 % — ABNORMAL LOW (ref 39.0–52.0)
Hemoglobin: 9.6 g/dL — ABNORMAL LOW (ref 13.0–17.0)
MCH: 28.8 pg (ref 26.0–34.0)
MCHC: 32.1 g/dL (ref 30.0–36.0)
MCV: 89.8 fL (ref 80.0–100.0)
Platelets: 340 10*3/uL (ref 150–400)
RBC: 3.33 MIL/uL — ABNORMAL LOW (ref 4.22–5.81)
RDW: 13.8 % (ref 11.5–15.5)
WBC: 8.6 10*3/uL (ref 4.0–10.5)
nRBC: 0 % (ref 0.0–0.2)

## 2020-01-10 MED ORDER — SULFAMETHOXAZOLE-TRIMETHOPRIM 800-160 MG PO TABS: 1.0000 | ORAL_TABLET | Freq: Two times a day (BID) | ORAL | 0 refills | Status: AC

## 2020-01-10 MED ORDER — FERROUS SULFATE 325 (65 FE) MG PO TABS: 325.0000 mg | ORAL_TABLET | Freq: Every day | ORAL | 3 refills | Status: DC

## 2020-01-10 MED ORDER — SULFAMETHOXAZOLE-TRIMETHOPRIM 800-160 MG PO TABS
1.0000 | ORAL_TABLET | Freq: Two times a day (BID) | ORAL | Status: DC
  Administered 2020-01-10: 1 via ORAL
  Filled 2020-01-10: qty 1

## 2020-01-10 MED ORDER — HYDROCODONE-ACETAMINOPHEN 5-325 MG PO TABS: 1.0000 | ORAL_TABLET | Freq: Four times a day (QID) | ORAL | 0 refills | Status: AC | PRN

## 2020-01-10 NOTE — Discharge Summary (Signed)
[facility]  Physician Discharge Summary   [name] MRN: [id number] DOB/AGE: 1955-09-04 64 y.o.  Admit date: 01/07/2020 Discharge date: 01/10/2020  PCP: Denita Lung, MD  DISCHARGE DIAGNOSES:  Swelling and pain of the right hand and distal forearm, cellulitis versus inflammation Iron deficiency anemia Mild transaminitis Hyponatremia  RECOMMENDATIONS FOR OUTPATIENT FOLLOW UP: 1. LFTs to be rechecked in 1 week 2. Patient to follow-up with Dr. Burney Gauze tomorrow 3. Further work-up for iron deficiency anemia in the outpatient setting    Home Health: None Equipment/Devices: None  CODE STATUS: Full code  DISCHARGE CONDITION: fair  Diet recommendation: Regular  INITIAL HISTORY: 64 y.o.malewith medical history significant ofDDD, GERD. Pt had cut over thumb x3 weeks ago. Since that time he has had progressively progressive erythema, edema, pain. Starting in thumb but eventually extending down thumb into wrist and now up forearm.  Presented to PCP who referred him to Dr. Bertis Ruddy office.  It was felt that patient had cellulitis of his right forearm and hand.  He was directly admitted to the hospital for further management.  Consultants: Dr. Burney Gauze with hand surgery  Procedures: Irrigation and debridement of the right hand on 10/15   HOSPITAL COURSE:   Swelling and pain in the right hand and distal forearm  Concern was for cellulitis/infection.  Patient underwent irrigation and debridement.  No evidence for abscess noted.  There was significant fluid in the carpal canal.  Cultures were sent.  Hypertrophic synovium was also noted. Discussed with Dr. Burney Gauze.  Findings are somewhat atypical for bacterial infection.  Patient does admit to working in the garden and recently planted a rose bush.  Fungal infections need to be in the differential.  Mycobacterium is in the differential.    Patient was given broad-spectrum antibiotics with  vancomycin and ceftriaxone.  MRSA PCR however was noted to be negative.  Gram staining did not show any organisms. Acute inflammation is also in the differential.  CRP was 3.7.  ESR 4.4.  Uric acid level 6.0. We are at this time we will hold off on steroids.  To be considered based on culture data. WBC is noted to be normal this morning.  He is afebrile.  Patient feels better.  Swelling of the hand has improved.  He will be transitioned to oral Bactrim.  Patient to follow-up with Dr. Burney Gauze tomorrow  Iron deficiency anemia  Hemoglobin is low but stable.  Ferritin 28, iron 19, TIBC 461, percent saturation 4.  Folic acid 78.6.  B12 500.  Patient has iron deficiency anemia.  We will start him on iron supplementation at discharge.  He will need further work-up in the outpatient setting for same.   Patient has had a colonoscopy within the last 5 years.  Sessile polyp was noted.  No adenomatous or malignant features noted on pathology.  Hyponatremia/acute renal failure Renal function has improved.    Sodium level is stable  Abnormal LFTs Mildly elevated AST ALT level noted this morning.  Reason for this is not entirely clear.  Denies any abdominal discomfort.  Will recommend that this be rechecked next week at follow-up with PCP.   Patient is stable.  Okay for discharge home today.  Discussed with his daughter as well.   PERTINENT LABS:  The results of significant diagnostics from this hospitalization (including imaging, microbiology, ancillary and laboratory) are listed below for reference.    Microbiology: Recent Results (from the past 240 hour(s))  Culture, blood (routine x 2)  Status: None (Preliminary result)   Collection Time: 01/07/20  9:39 PM   Specimen: BLOOD  Result Value Ref Range Status   Specimen Description BLOOD RIGHT ANTECUBITAL  Final   Special Requests   Final    BOTTLES DRAWN AEROBIC AND ANAEROBIC Blood Culture adequate volume   Culture   Final    NO GROWTH 3  DAYS Performed at [facility], [address], [place], [place] [address]    Report Status PENDING  Incomplete  Culture, blood (routine x 2)     Status: None (Preliminary result)   Collection Time: 01/07/20  9:40 PM   Specimen: BLOOD LEFT HAND  Result Value Ref Range Status   Specimen Description BLOOD LEFT HAND  Final   Special Requests   Final    BOTTLES DRAWN AEROBIC AND ANAEROBIC Blood Culture adequate volume   Culture   Final    NO GROWTH 3 DAYS Performed at [facility], [place] [address] [place], [place] [address]    Report Status PENDING  Incomplete  MRSA PCR Screening     Status: None   Collection Time: 01/07/20 10:01 PM   Specimen: Nasopharyngeal Wash  Result Value Ref Range Status   MRSA by PCR NEGATIVE NEGATIVE Final    Comment:        The GeneXpert MRSA Assay (FDA approved for NASAL specimens only), is one component of a comprehensive MRSA colonization surveillance program. It is not intended to diagnose MRSA infection nor to guide or monitor treatment for MRSA infections. Performed at [facility], [place] [address] [place], [place] [address]   Respiratory Panel by RT PCR (Flu A&B, Covid) - Nasopharyngeal Swab     Status: None   Collection Time: 01/07/20 10:01 PM   Specimen: Nasopharyngeal Swab  Result Value Ref Range Status   SARS Coronavirus 2 by RT PCR NEGATIVE NEGATIVE Final    Comment: (NOTE) SARS-CoV-2 target nucleic acids are NOT DETECTED.  The SARS-CoV-2 RNA is generally detectable in upper respiratoy specimens during the acute phase of infection. The lowest concentration of SARS-CoV-2 viral copies this assay can detect is 131 copies/mL. A negative result does not preclude SARS-Cov-2 infection and should not be used as the sole basis for treatment or other patient management decisions. A negative result may occur with  improper specimen collection/handling, submission of specimen other than nasopharyngeal swab, presence of viral  mutation(s) within the areas targeted by this assay, and inadequate number of viral copies (<131 copies/mL). A negative result must be combined with clinical observations, patient history, and epidemiological information. The expected result is Negative.  Fact Sheet for Patients:  PinkCheek.be  Fact Sheet for Healthcare Providers:  GravelBags.it  This test is no t yet approved or cleared by the [place] FDA and  has been authorized for detection and/or diagnosis of SARS-CoV-2 by FDA under an Emergency Use Authorization (EUA). This EUA will remain  in effect (meaning this test can be used) for the duration of the COVID-19 declaration under Section 564(b)(1) of the Act, 21 U.S.C. section 360bbb-3(b)(1), unless the authorization is terminated or revoked sooner.     Influenza A by PCR NEGATIVE NEGATIVE Final   Influenza B by PCR NEGATIVE NEGATIVE Final    Comment: (NOTE) The Xpert Xpress SARS-CoV-2/FLU/RSV assay is intended as an aid in  the diagnosis of influenza from Nasopharyngeal swab specimens and  should not be used as a sole basis for treatment. Nasal washings and  aspirates are unacceptable  for Xpert Xpress SARS-CoV-2/FLU/RSV  testing.  Fact Sheet for Patients: PinkCheek.be  Fact Sheet for Healthcare Providers: GravelBags.it  This test is not yet approved or cleared by the [place] FDA and  has been authorized for detection and/or diagnosis of SARS-CoV-2 by  FDA under an Emergency Use Authorization (EUA). This EUA will remain  in effect (meaning this test can be used) for the duration of the  Covid-19 declaration under Section 564(b)(1) of the Act, 21  U.S.C. section 360bbb-3(b)(1), unless the authorization is  terminated or revoked. Performed at [facility], [place] [address] [place], [place] [address]   Aerobic/Anaerobic Culture  (surgical/deep wound)     Status: None (Preliminary result)   Collection Time: 01/08/20  4:20 PM   Specimen: Soft Tissue, Other  Result Value Ref Range Status   Specimen Description ABSCESS RIGHT HAND  Final   Special Requests NONE  Final   Gram Stain   Final    ABUNDANT WBC PRESENT, PREDOMINANTLY PMN NO ORGANISMS SEEN    Culture   Final    NO GROWTH 2 DAYS Performed at [facility], [place] [address], [place], [place] [address]    Report Status PENDING  Incomplete     Labs:    Basic Metabolic Panel: Recent Labs  Lab 01/07/20 2140 01/08/20 1230 01/09/20 0135 01/10/20 0609  NA 130* 129* 136 132*  K 4.0 4.3 4.6 3.9  CL 98 96* 99 100  CO2 23 22 25 24   GLUCOSE 131* 127* 150* 132*  BUN 19 14 17 16   CREATININE 1.40* 0.92 1.17 0.95  CALCIUM 8.7* 9.3 9.3 8.1*   Liver Function Tests: Recent Labs  Lab 01/07/20 2140 01/09/20 0135 01/10/20 0609  AST 30 27 58*  ALT 23 23 45*  ALKPHOS 67 68 73  BILITOT 0.4 0.5 0.5  PROT 6.5 6.5 5.9*  ALBUMIN 3.7 3.4* 2.8*   CBC: Recent Labs  Lab 01/07/20 2140 01/08/20 1230 01/09/20 0135 01/10/20 0111  WBC 8.6 12.1* 11.2* 8.6  NEUTROABS 5.6  --   --   --   HGB 10.4* 10.9* 10.6* 9.6*  HCT 31.7* 33.5* 33.1* 29.9*  MCV 88.5 88.6 90.4 89.8  PLT 361 401* 371 340    CBG: Recent Labs  Lab 01/10/20 0335 01/10/20 0557  GLUCAP 230* 149*     IMAGING STUDIES No results found.  DISCHARGE EXAMINATION: Vitals:   01/09/20 1042 01/09/20 1807 01/09/20 2205 01/10/20 0500  BP: (!) 168/83 (!) 163/87 (!) 153/67 (!) 157/91  Pulse: 95 89 (!) 104 77  Resp: 16 18 18 16   Temp: 98.4 F (36.9 C) 98.1 F (36.7 C) (!) 97.4 F (36.3 C) 98.5 F (36.9 C)  TempSrc: Oral Oral  Oral  SpO2: 95% 98% 100%   Weight:      Height:       Swelling of his fingers on the right hand is much better.  Able to move his fingers more than before.     DISPOSITION: Home  Discharge Instructions    Call MD for:  difficulty breathing, headache or visual  disturbances   Complete by: As directed    Call MD for:  extreme fatigue   Complete by: As directed    Call MD for:  hives   Complete by: As directed    Call MD for:  persistant dizziness or light-headedness   Complete by: As directed    Call MD for:  persistant nausea and vomiting   Complete by:  As directed    Call MD for:  redness, tenderness, or signs of infection (pain, swelling, redness, odor or green/yellow discharge around incision site)   Complete by: As directed    Call MD for:  severe uncontrolled pain   Complete by: As directed    Call MD for:  temperature >100.4   Complete by: As directed    Diet general   Complete by: As directed    Discharge instructions   Complete by: As directed    Please be sure to follow-up with Dr. Burney Gauze tomorrow.  You  may need to call his office to make an appointment. You will need to have your liver function tests done in a week.  You may also need further workup of the iron deficiency anemia. Seek attention if the pain in the right arm and hand gets worse.   Seek attention if you develop new symptoms such as nausea vomiting abdominal pain.  You were cared for by a hospitalist during your hospital stay. If you have any questions about your discharge medications or the care you received while you were in the hospital after you are discharged, you can call the unit and asked to speak with the hospitalist on call if the hospitalist that took care of you is not available. Once you are discharged, your primary care physician will handle any further medical issues. Please note that NO REFILLS for any discharge medications will be authorized once you are discharged, as it is imperative that you return to your primary care physician (or establish a relationship with a primary care physician if you do not have one) for your aftercare needs so that they can reassess your need for medications and monitor your lab values. If you do not have a primary care  physician, you can call [phone] for a physician referral.   Increase activity slowly   Complete by: As directed    No wound care   Complete by: As directed         Allergies as of 01/10/2020      Reactions   Daypro [oxaprozin]    GASTRIC PROBLEMS   Methadone Itching      Medication List    TAKE these medications   augmented betamethasone dipropionate 0.05 % cream Commonly known as: DIPROLENE-AF Apply 1 application topically 2 (two) times daily.   carisoprodol 350 MG tablet Commonly known as: SOMA TAKE 1 TABLET BY MOUTH THREE TIMES A DAY AS NEEDED FOR SPASM What changed: See the new instructions.   celecoxib 200 MG capsule Commonly known as: CELEBREX TAKE 1 CAPSULE BY MOUTH TWICE A DAY   Echinacea 400 MG Caps Take 1 capsule by mouth daily.   ferrous sulfate 325 (65 FE) MG tablet Take 1 tablet (325 mg total) by mouth daily.   finasteride 1 MG tablet Commonly known as: PROPECIA Take 0.5 mg by mouth daily.   glucosamine-chondroitin 500-400 MG tablet Take 1 tablet by mouth in the morning and at bedtime.   HYDROcodone-acetaminophen 5-325 MG tablet Commonly known as: NORCO/VICODIN Take 1 tablet by mouth every 6 (six) hours as needed for up to 7 days for moderate pain.   Krill Oil 1000 MG Caps Take 1 capsule by mouth daily.   MELATONIN PO Take 10 mg by mouth at bedtime.   multivitamin with minerals tablet Take 1 tablet by mouth daily.   nortriptyline 50 MG capsule Commonly known as: PAMELOR TAKE 1 CAPSULE BY MOUTH EVERYDAY AT BEDTIME What changed: See the  new instructions.   sulfamethoxazole-trimethoprim 800-160 MG tablet Commonly known as: BACTRIM DS Take 1 tablet by mouth every 12 (twelve) hours for 8 days.   tadalafil 20 MG tablet Commonly known as: CIALIS TAKE 1 TABLET BY MOUTH EVERY DAY AS NEEDED FOR ERECTILE DYSFUNCTION What changed:   how much to take  how to take this  when to take this  reasons to take this  additional instructions     TURMERIC PO Take 1 tablet by mouth daily.   vitamin C 1000 MG tablet Take 1,000 mg by mouth daily.   VITAMIN D PO Take 1 tablet by mouth daily.   vitamin E 45 MG (100 UNITS) capsule Take 100 Units by mouth daily.   Zinc 50 MG Tabs Take 1 tablet by mouth daily.   zolpidem 10 MG tablet Commonly known as: AMBIEN TAKE 1 TABLET BY MOUTH AT BEDTIME AS NEEDED FOR SLEEP What changed:   reasons to take this  additional instructions         Follow-up Information    Charlotte Crumb, MD Follow up.   Specialty: Orthopedic Surgery Why: please call office tomorrow. Contact information: [place] [address] [phone]        Denita Lung, MD. Schedule an appointment as soon as possible for a visit in 1 week(s).   Specialty: Family Medicine Why: you will need to have your liver function tests done in a week. You may also need further workup of the iron deficiency anemia. Contact information: [address]  [place] [address] [phone]               TOTAL DISCHARGE TIME: 35 minutes  [facility] Pager on [email or web address]  01/10/2020, 11:17 AM

## 2020-01-10 NOTE — Progress Notes (Signed)
Lab at bedside to draw repeat CMP.  BS take and value was 149.  Patient is alert and oriented times 4.  No distress noted.

## 2020-01-10 NOTE — Discharge Instructions (Signed)
Cellulitis, Adult  Cellulitis is a skin infection. The infected area is often warm, red, swollen, and sore. It occurs most often in the arms and lower legs. It is very important to get treated for this condition. What are the causes? This condition is caused by bacteria. The bacteria enter through a break in the skin, such as a cut, burn, insect bite, open sore, or crack. What increases the risk? This condition is more likely to occur in people who:  Have a weak body defense system (immune system).  Have open cuts, burns, bites, or scrapes on the skin.  Are older than 65 years of age.  Have a blood sugar problem (diabetes).  Have a long-lasting (chronic) liver disease (cirrhosis) or kidney disease.  Are very overweight (obese).  Have a skin problem, such as: ? Itchy rash (eczema). ? Slow movement of blood in the veins (venous stasis). ? Fluid buildup below the skin (edema).  Have been treated with high-energy rays (radiation).  Use IV drugs. What are the signs or symptoms? Symptoms of this condition include:  Skin that is: ? Red. ? Streaking. ? Spotting. ? Swollen. ? Sore or painful when you touch it. ? Warm.  A fever.  Chills.  Blisters. How is this diagnosed? This condition is diagnosed based on:  Medical history.  Physical exam.  Blood tests.  Imaging tests. How is this treated? Treatment for this condition may include:  Medicines to treat infections or allergies.  Home care, such as: ? Rest. ? Placing cold or warm cloths (compresses) on the skin.  Hospital care, if the condition is very bad. Follow these instructions at home: Medicines  Take over-the-counter and prescription medicines only as told by your doctor.  If you were prescribed an antibiotic medicine, take it as told by your doctor. Do not stop taking it even if you start to feel better. General instructions   Drink enough fluid to keep your pee (urine) pale yellow.  Do not touch  or rub the infected area.  Raise (elevate) the infected area above the level of your heart while you are sitting or lying down.  Place cold or warm cloths on the area as told by your doctor.  Keep all follow-up visits as told by your doctor. This is important. Contact a doctor if:  You have a fever.  You do not start to get better after 1-2 days of treatment.  Your bone or joint under the infected area starts to hurt after the skin has healed.  Your infection comes back. This can happen in the same area or another area.  You have a swollen bump in the area.  You have new symptoms.  You feel ill and have muscle aches and pains. Get help right away if:  Your symptoms get worse.  You feel very sleepy.  You throw up (vomit) or have watery poop (diarrhea) for a long time.  You see red streaks coming from the area.  Your red area gets larger.  Your red area turns dark in color. These symptoms may represent a serious problem that is an emergency. Do not wait to see if the symptoms will go away. Get medical help right away. Call your local emergency services (911 in the [place] Do not drive yourself to the hospital. Summary  Cellulitis is a skin infection. The area is often warm, red, swollen, and sore.  This condition is treated with medicines, rest, and cold and warm cloths.  Take all medicines only   as told by your doctor.  Tell your doctor if symptoms do not start to get better after 1-2 days of treatment. This information is not intended to replace advice given to you by your health care provider. Make sure you discuss any questions you have with your health care provider. Document Revised: 08/01/2017 Document Reviewed: 08/01/2017 Elsevier Patient Education  [place].   Iron Deficiency Anemia, Adult Iron-deficiency anemia is when you have a low amount of red blood cells or hemoglobin. This happens because you have too little iron in your body. Hemoglobin  carries oxygen to parts of the body. Anemia can cause your body to not get enough oxygen. It may or may not cause symptoms. Follow these instructions at home: Medicines  Take over-the-counter and prescription medicines only as told by your doctor. This includes iron pills (supplements) and vitamins.  If you cannot handle taking iron pills by mouth, ask your doctor about getting iron through: ? A vein (intravenously). ? A shot (injection) into a muscle.  Take iron pills when your stomach is empty. If you cannot handle this, take them with food.  Do not drink milk or take antacids at the same time as your iron pills.  To prevent trouble pooping (constipation), eat fiber or take medicine (stool softener) as told by your doctor. Eating and drinking   Talk with your doctor before changing the foods you eat. He or she may tell you to eat foods that have a lot of iron, such as: ? Liver. ? Lowfat (lean) beef. ? Breads and cereals that have iron added to them (fortified breads and cereals). ? Eggs. ? Dried fruit. ? Dark green, leafy vegetables.  Drink enough fluid to keep your pee (urine) clear or pale yellow.  Eat fresh fruits and vegetables that are high in vitamin C. They help your body to use iron. Foods with a lot of vitamin C include: ? Oranges. ? Peppers. ? Tomatoes. ? Mangoes. General instructions  Return to your normal activities as told by your doctor. Ask your doctor what activities are safe for you.  Keep yourself clean, and keep things clean around you (your surroundings). Anemia can make you get sick more easily.  Keep all follow-up visits as told by your doctor. This is important. Contact a doctor if:  You feel sick to your stomach (nauseous).  You throw up (vomit).  You feel weak.  You are sweating for no clear reason.  You have trouble pooping, such as: ? Pooping (having a bowel movement) less than 3 times a week. ? Straining to poop. ? Having poop that is  hard, dry, or larger than normal. ? Feeling full or bloated. ? Pain in the lower belly. ? Not feeling better after pooping. Get help right away if:  You pass out (faint). If this happens, do not drive yourself to the hospital. Call your local emergency services (911 in the [place]  You have chest pain.  You have shortness of breath that: ? Is very bad. ? Gets worse with physical activity.  You have a fast heartbeat.  You get light-headed when getting up from sitting or lying down. This information is not intended to replace advice given to you by your health care provider. Make sure you discuss any questions you have with your health care provider. Document Revised: 02/22/2017 Document Reviewed: 11/30/2015 Elsevier Patient Education  [place].

## 2020-01-10 NOTE — Progress Notes (Signed)
Received call from lab. Lab reports critical lab values as followed:  Sodium level -117 and Blood glucose level of 698.  I have texted paged Provider Blount and made her aware of this information.  I have also made Provider Blount aware that Blood sugar checked on the unit with unit blood glucose meter and BS was 230.  I placed an order for lab to re-draw the [place].  Patient is alert and oriented times 4, no distress noted at this time.  Lab called and made aware of Stat order for repeat CMP. No new orders given at this time.

## 2020-01-10 NOTE — Plan of Care (Signed)
  Problem: Coping: Goal: Level of anxiety will decrease Outcome: Completed/Met   Problem: Clinical Measurements: Goal: Ability to avoid or minimize complications of infection will improve Outcome: Completed/Met   Problem: Skin Integrity: Goal: Skin integrity will improve Outcome: Completed/Met   Problem: Education: Goal: Knowledge of General Education information will improve Description: Including pain rating scale, medication(s)/side effects and non-pharmacologic comfort measures Outcome: Completed/Met   Problem: Health Behavior/Discharge Planning: Goal: Ability to manage health-related needs will improve Outcome: Completed/Met   Problem: Clinical Measurements: Goal: Ability to maintain clinical measurements within normal limits will improve Outcome: Completed/Met Goal: Will remain free from infection Outcome: Completed/Met Goal: Diagnostic test results will improve Outcome: Completed/Met Goal: Respiratory complications will improve Outcome: Completed/Met Goal: Cardiovascular complication will be avoided Outcome: Completed/Met   Problem: Activity: Goal: Risk for activity intolerance will decrease Outcome: Completed/Met   Problem: Nutrition: Goal: Adequate nutrition will be maintained Outcome: Completed/Met   Problem: Coping: Goal: Level of anxiety will decrease Outcome: Completed/Met   Problem: Elimination: Goal: Will not experience complications related to bowel motility Outcome: Completed/Met Goal: Will not experience complications related to urinary retention Outcome: Completed/Met   Problem: Pain Managment: Goal: General experience of comfort will improve Outcome: Completed/Met   Problem: Safety: Goal: Ability to remain free from injury will improve Outcome: Completed/Met   Problem: Skin Integrity: Goal: Risk for impaired skin integrity will decrease Outcome: Completed/Met

## 2020-01-10 NOTE — Progress Notes (Signed)
Joseph [name] to be D/C'd  per MD order. Discussed with the patient and all questions fully answered.  IV catheter discontinued intact. Site without signs and symptoms of complications. Dressing and pressure applied.  An After Visit Summary was printed and given to the patient.   D/c education completed with patient/family including follow up instructions, medication list, d/c activities limitations if indicated, with other d/c instructions as indicated by MD - patient able to verbalize understanding, all questions fully answered.   Patient to be escorted via [place], and D/C home via private auto.

## 2020-01-11 LAB — SURGICAL PATHOLOGY

## 2020-01-12 ENCOUNTER — Telehealth: Payer: Self-pay

## 2020-01-12 LAB — CULTURE, BLOOD (ROUTINE X 2)
Culture: NO GROWTH
Culture: NO GROWTH
Special Requests: ADEQUATE
Special Requests: ADEQUATE

## 2020-01-12 NOTE — Telephone Encounter (Signed)
I called pt. Because he was recently in the hospital for cellulitis of rt. Hand and his medications were gone over and reconciled. He is scheduled for a f/u here on 01/13/20 at 2:45p.m.

## 2020-01-13 ENCOUNTER — Encounter: Payer: Self-pay | Admitting: Family Medicine

## 2020-01-13 ENCOUNTER — Ambulatory Visit: Payer: 59 | Admitting: Family Medicine

## 2020-01-13 VITALS — BP 168/92 | HR 96 | Temp 98.4°F | Wt 175.4 lb

## 2020-01-13 DIAGNOSIS — R945 Abnormal results of liver function studies: Secondary | ICD-10-CM

## 2020-01-13 DIAGNOSIS — D509 Iron deficiency anemia, unspecified: Secondary | ICD-10-CM | POA: Diagnosis not present

## 2020-01-13 DIAGNOSIS — I10 Essential (primary) hypertension: Secondary | ICD-10-CM

## 2020-01-13 LAB — AEROBIC/ANAEROBIC CULTURE W GRAM STAIN (SURGICAL/DEEP WOUND): Culture: NO GROWTH

## 2020-01-13 LAB — ACID FAST SMEAR (AFB, MYCOBACTERIA): Acid Fast Smear: NEGATIVE

## 2020-01-13 MED ORDER — LOSARTAN POTASSIUM-HCTZ 50-12.5 MG PO TABS: 1.0000 | ORAL_TABLET | Freq: Every day | ORAL | 3 refills | Status: DC

## 2020-01-13 NOTE — Progress Notes (Signed)
   Subjective:    [name], male    DOB: [birth year], 64 y.o.   MRN: [id number]  HPI He is here for consult after recent hospitalization and treatment for hand infection.  In the hospital he is liver enzymes were slightly elevated and he needs follow-up on this.  Also his blood pressure over the last several months has slowly increased.  In the hospital his hemoglobin also was trending downward.  They did iron studies on him which did show evidence of iron deficiency anemia.  He did have a colonoscopy done several years ago which was negative.  He admits to having poor eating habits.  He is now taking a multivitamin with iron.   Review of Systems     Objective:   Physical Exam Alert and in no distress.  Cardiac exam shows an occasional missed beat with compensatory pause.       Assessment & Plan:  Abnormal liver function - Plan: Comprehensive metabolic panel  Iron deficiency anemia, unspecified iron deficiency anemia type  Hypertension, unspecified type - Plan: losartan-hydrochlorothiazide (HYZAAR) 50-12.5 MG tablet Since he now has evidence of hypertension based on the last several months reading, I will place him on medication.  Discussed possible side effects with him concerning that.  He is to continue on his multivitamin with iron.  Recheck blood work in blood pressure in 2 months. Also briefly discussed his cardiac exam.  He will pay attention to this and if he has further difficulty, he will call for reevaluation. Over 30 minutes spent discussing all these issues with him

## 2020-01-14 LAB — COMPREHENSIVE METABOLIC PANEL
ALT: 39 IU/L (ref 0–44)
AST: 29 IU/L (ref 0–40)
Albumin/Globulin Ratio: 1.9 (ref 1.2–2.2)
Albumin: 4.4 g/dL (ref 3.8–4.8)
Alkaline Phosphatase: 105 IU/L (ref 44–121)
BUN/Creatinine Ratio: 10 (ref 10–24)
BUN: 11 mg/dL (ref 8–27)
Bilirubin Total: 0.3 mg/dL (ref 0.0–1.2)
CO2: 23 mmol/L (ref 20–29)
Calcium: 9.6 mg/dL (ref 8.6–10.2)
Chloride: 97 mmol/L (ref 96–106)
Creatinine, Ser: 1.07 mg/dL (ref 0.76–1.27)
GFR calc Af Amer: 84 mL/min/{1.73_m2} (ref >59)
GFR calc non Af Amer: 72 mL/min/{1.73_m2} (ref >59)
Globulin, Total: 2.3 g/dL (ref 1.5–4.5)
Glucose: 92 mg/dL (ref 65–99)
Potassium: 4 mmol/L (ref 3.5–5.2)
Sodium: 136 mmol/L (ref 134–144)
Total Protein: 6.7 g/dL (ref 6.0–8.5)

## 2020-02-08 LAB — FUNGUS CULTURE RESULT

## 2020-02-08 LAB — FUNGUS CULTURE WITH STAIN

## 2020-02-08 LAB — FUNGAL ORGANISM REFLEX

## 2020-02-25 LAB — ACID FAST CULTURE WITH REFLEXED SENSITIVITIES (MYCOBACTERIA): Acid Fast Culture: NEGATIVE

## 2020-03-06 ENCOUNTER — Other Ambulatory Visit: Payer: Self-pay | Admitting: Family Medicine

## 2020-03-07 NOTE — Telephone Encounter (Signed)
[facility]. Please advise KH 

## 2020-03-12 ENCOUNTER — Other Ambulatory Visit: Payer: Self-pay | Admitting: Family Medicine

## 2020-03-14 ENCOUNTER — Ambulatory Visit: Payer: 59 | Admitting: Family Medicine

## 2020-03-14 NOTE — Telephone Encounter (Signed)
[facility] to [facility] som[facility]. Please advise KH 

## 2020-03-16 ENCOUNTER — Ambulatory Visit (INDEPENDENT_AMBULATORY_CARE_PROVIDER_SITE_OTHER): Payer: 59 | Admitting: Family Medicine

## 2020-03-16 ENCOUNTER — Other Ambulatory Visit: Payer: Self-pay

## 2020-03-16 ENCOUNTER — Encounter: Payer: Self-pay | Admitting: Family Medicine

## 2020-03-16 VITALS — BP 98/64 | HR 74 | Temp 97.9°F | Wt 181.8 lb

## 2020-03-16 DIAGNOSIS — I10 Essential (primary) hypertension: Secondary | ICD-10-CM

## 2020-03-16 DIAGNOSIS — I491 Atrial premature depolarization: Secondary | ICD-10-CM | POA: Diagnosis not present

## 2020-03-16 MED ORDER — LOSARTAN POTASSIUM 50 MG PO TABS: 50.0000 mg | ORAL_TABLET | Freq: Every day | ORAL | 3 refills | Status: DC

## 2020-03-16 NOTE — Progress Notes (Signed)
   Subjective:    [name], male    DOB: 12-01-1955, 64 y.o.   MRN: [id number]  HPI He is here for a recheck on his blood pressure.  He has been on losartan/HCTZ and did have some initial trouble with dizziness but this has diminished.  There is also question about his heart rate.  He does have a previous history of PACs and does rarely have difficulty with palpitations.  No chest pain or shortness of breath.   Review of Systems     Objective:   Physical Exam Alert and in no distress.  Cardiac exam does show an irregular rhythm.  EKG read by me does show a sinus rhythm with PACs with a rate of roughly 88.  Other parameters normal.       Assessment & Plan:  Hypertension, unspecified type - Plan: losartan (COZAAR) 50 MG tablet, Ambulatory referral to Cardiology  PAC (premature atrial contraction) - Plan: EKG 12-Lead, Ambulatory referral to Cardiology , Decreased his blood pressure medicine by eliminating the HCTZ.  Discussed the PACs and explained that they are relatively benign.  Apparently his daughter who works in an ICU would like a cardiology evaluation.

## 2020-04-14 ENCOUNTER — Telehealth: Payer: Self-pay | Admitting: Family Medicine

## 2020-04-14 DIAGNOSIS — I10 Essential (primary) hypertension: Secondary | ICD-10-CM

## 2020-04-14 NOTE — Telephone Encounter (Signed)
Pt called and said he never received his Losarten from the mail order pharmacy and wanted to see if you can send that to the [facility] [address].

## 2020-04-15 ENCOUNTER — Ambulatory Visit: Payer: Self-pay

## 2020-04-15 ENCOUNTER — Other Ambulatory Visit: Payer: Self-pay | Admitting: Family Medicine

## 2020-04-15 ENCOUNTER — Other Ambulatory Visit: Payer: Self-pay

## 2020-04-15 DIAGNOSIS — M25561 Pain in right knee: Secondary | ICD-10-CM

## 2020-04-15 MED ORDER — LOSARTAN POTASSIUM 50 MG PO TABS: 50.0000 mg | ORAL_TABLET | Freq: Every day | ORAL | 3 refills | Status: DC

## 2020-05-03 ENCOUNTER — Other Ambulatory Visit: Payer: Self-pay

## 2020-05-03 ENCOUNTER — Ambulatory Visit: Payer: 59 | Admitting: Cardiovascular Disease

## 2020-05-03 ENCOUNTER — Encounter: Payer: Self-pay | Admitting: Cardiovascular Disease

## 2020-05-03 VITALS — BP 122/88 | HR 88 | Ht 76.0 in | Wt 187.4 lb

## 2020-05-03 DIAGNOSIS — I10 Essential (primary) hypertension: Secondary | ICD-10-CM | POA: Diagnosis not present

## 2020-05-03 DIAGNOSIS — R002 Palpitations: Secondary | ICD-10-CM

## 2020-05-03 DIAGNOSIS — D509 Iron deficiency anemia, unspecified: Secondary | ICD-10-CM | POA: Diagnosis not present

## 2020-05-03 DIAGNOSIS — I491 Atrial premature depolarization: Secondary | ICD-10-CM

## 2020-05-03 NOTE — Patient Instructions (Signed)
Medication Instructions:  No changes *If you need a refill on your cardiac medications before your next appointment, please call your pharmacy*   Lab Work: None ordered If you have labs (blood work) drawn today and your tests are completely normal, you will receive your results only by: MyChart Message (if you have MyChart) OR A paper copy in the mail If you have any lab test that is abnormal or we need to change your treatment, we will call you to review the results.   Testing/Procedures: Your physician has requested that you have an echocardiogram. Echocardiography is a painless test that uses sound waves to create images of your heart. It provides your doctor with information about the size and shape of your heart and how well your heart's chambers and valves are working. You may receive an ultrasound enhancing agent through an IV if needed to better visualize your heart during the echo.This procedure takes approximately one hour. There are no restrictions for this procedure. This will take place at the [address], [address].    Follow-Up: At [facility], you and your health needs are our priority.  As part of our continuing mission to provide you with exceptional heart care, we have created designated Provider Care Teams.  These Care Teams include your primary Cardiologist (physician) and Advanced Practice Providers (APPs -  Physician Assistants and Nurse Practitioners) who all work together to provide you with the care you need, when you need it.  We recommend signing up for the patient portal called "MyChart".  Sign up information is provided on this After Visit Summary.  MyChart is used to connect with patients for Virtual Visits (Telemedicine).  Patients are able to view lab/test results, encounter notes, upcoming appointments, etc.  Non-urgent messages can be sent to your provider as well.   To learn more about what you can do with MyChart, go to [email or web address].     Your next appointment:   Follow up as needed with Dr. Croitoru  

## 2020-05-03 NOTE — Progress Notes (Signed)
Cardiology Consultation:   [name] MRN: [id number]; DOB: Apr 26, 1955  Admit date: (Not on file) Date of Consult: 05/08/2020  Primary Care Provider: Denita Lung, MD [facility] Cardiologist: No primary care provider on file. New [facility] Electrophysiologist:  None    Patient Profile:   Joseph [name] is a 65 y.o. male with a hx of HTN, lumbar spine stenosis, otherwise generally excellent health who is being seen today for the evaluation of arrhythmia at the request of Dr. Redmond School.  History of Present Illness:   Joseph [name] was noted to have an irregular rhythm during physical exam with his primary care provider.  An ECG was performed in the computer algorithm diagnosed "atrial fibrillation" and he was therefore referred for evaluation.  However, on my review of the ECG he appears to have sinus rhythm with very frequent premature atrial contractions (5 PACs on the 10-second ECG strip).  Similarly today he has very frequent ectopy on physical exam and his ECG shows premature atrial contractions. He is completely unaware of the irregularity of his heartbeat.   He is quite active and denies any cardiovascular complaints.The patient specifically denies any chest pain at rest exertion, dyspnea at rest or with exertion, orthopnea, paroxysmal nocturnal dyspnea, syncope, palpitations, focal neurological deficits, intermittent claudication, lower extremity edema, unexplained weight gain, cough, hemoptysis or wheezing.  He is physically active and does not have any limitations to his usual activities from any cardiovascular complaints.  His brother has a history of atrial fibrillation on a background of obesity, alcohol abuse, congestive heart failure.  He has a defibrillator.  There maternal grandfather had congestive heart failure and died in his 77s.  A paternal uncle also has atrial fibrillation, age of onset unclear.  A maternal uncle had a myocardial infarction in his  78s.  Their dad died of lymphoma.  Mom had severe scoliosis and celiac disease.  There is no family history of stroke as far as I can tell.  Most of Joseph [name] health problems have been related to degenerative disc disease with lumbar spine stenosis and degenerative scoliosis with secondary neurogenic claudication.  Last significant symptoms are related to GERD and erectile dysfunction.  He had laser ablation of varicose veins of the lower extremities several years ago (left leg worse than right).  He quit smoking about 20 years ago and is a very light consumer of alcohol (at most 1 or 2 beers a week).  He does not drink a lot of coffee or other caffeinated beverages.  A recent lipid profile showed LDL of 96 with excellent HDL cholesterol and a borderline hemoglobin A1c of 5.8%.  He has normal renal function.  He has mild normocytic normochromic anemia, with labs last October that confirm iron deficiency.  This was diagnosed incidentally during a hospitalization for upper extremity cellulitis following an injury.  Iron supplementation was started, but he remains anemic (possibly due to the fact that he is taking a very low-dose of iron 325 mg once daily).  He had a colonoscopy in 2016 with a single sessile polyp without adenomatous or malignant findings.   Past Medical History:  Diagnosis Date  . Alopecia   . DDD (degenerative disc disease)   . Degenerative scoliosis 07/07/2018  . Diverticulosis   . ED (erectile dysfunction)   . GERD (gastroesophageal reflux disease)   . Melanoma [facility])    sees Dr. [place] Crane.  back and chest  . Neurogenic claudication ([facility]) 07/07/2018  . Renal stone   .  Spinal stenosis of lumbar region 07/07/2018    Past Surgical History:  Procedure Laterality Date  . I & D EXTREMITY Right 01/08/2020   Procedure: IRRIGATION AND DEBRIDEMENT RIGHT HAND;  Surgeon: Charlotte Crumb, MD;  Location: [facility];  Service: Orthopedics;  Laterality: Right;  . KNEE ARTHROSCOPY  2008   RIGHT   . LUMBAR DISC SURGERY    . MELANOMA EXCISION       Home Medications:  Prior to Admission medications   Medication Sig Start Date End Date Taking? Authorizing Provider  Ascorbic Acid (VITAMIN C) 1000 MG tablet Take 1,000 mg by mouth daily.   Yes [provider]  augmented betamethasone dipropionate (DIPROLENE-AF) 0.05 % cream Apply 1 application topically 2 (two) times daily.  11/13/19  Yes [provider]  carisoprodol (SOMA) 350 MG tablet Take 1 tablet (350 mg total) by mouth daily as needed for muscle spasms. 03/14/20  Yes Denita Lung, MD  celecoxib (CELEBREX) 200 MG capsule TAKE 1 CAPSULE BY MOUTH TWICE A DAY Patient taking differently: Take 200 mg by mouth 2 (two) times daily. 11/12/19  Yes Denita Lung, MD  clobetasol cream (TEMOVATE) 0.05 %  03/15/20  Yes [provider]  cyclobenzaprine (FLEXERIL) 10 MG tablet Take 10 mg by mouth 2 (two) times daily as needed. 01/10/20  Yes [provider]  Echinacea 400 MG CAPS Take 1 capsule by mouth daily.    Yes [provider]  ferrous sulfate 325 (65 FE) MG tablet Take 1 tablet (325 mg total) by mouth daily. 01/10/20 05/09/20 Yes Bonnielee Haff, MD  finasteride (PROPECIA) 1 MG tablet Take 0.5 mg by mouth daily.    Yes [provider]  glucosamine-chondroitin 500-400 MG tablet Take 1 tablet by mouth in the morning and at bedtime.    Yes [provider]  Javier Docker Oil 1000 MG CAPS Take 1 capsule by mouth daily.    Yes [provider]  losartan (COZAAR) 50 MG tablet Take 1 tablet (50 mg total) by mouth daily. 04/15/20  Yes Denita Lung, MD  MELATONIN PO Take 10 mg by mouth at bedtime.    Yes [provider]  Multiple Vitamins-Minerals (MULTIVITAMIN WITH MINERALS) tablet Take 1 tablet by mouth daily.   Yes [provider]  nortriptyline (PAMELOR) 50 MG capsule TAKE 1 CAPSULE BY MOUTH EVERYDAY AT BEDTIME Patient taking differently: Take 50 mg by mouth at  bedtime. 11/12/19  Yes Jaffe, Adam R, DO  tadalafil (CIALIS) 20 MG tablet TAKE 1 TABLET BY MOUTH EVERY DAY AS NEEDED FOR ERECTILE DYSFUNCTION Patient taking differently: Take 20 mg by mouth daily as needed for erectile dysfunction. 08/27/19  Yes Denita Lung, MD  TURMERIC PO Take 1 tablet by mouth daily.    Yes [provider]  VITAMIN D PO Take 1 tablet by mouth daily.    Yes [provider]  vitamin E 100 UNIT capsule Take 100 Units by mouth daily.    Yes [provider]  Zinc 50 MG TABS Take 1 tablet by mouth daily.    Yes [provider]  zolpidem (AMBIEN) 10 MG tablet TAKE 1 TABLET BY MOUTH EVERY DAY AT BEDTIME AS NEEDED FOR SLEEP 03/07/20  Yes Denita Lung, MD      Allergies:    Allergies  Allergen Reactions  . Daypro [Oxaprozin]     GASTRIC PROBLEMS  . Methadone Itching    Social History:   Social History   Socioeconomic History  .  Marital status: Married    Spouse name: sarah Dotts  . Number of children: 1  . Years of education: Not on file  . Highest education level: Not on file  Occupational History  . Not on file  Tobacco Use  . Smoking status: Former Smoker    Packs/day: 1.00    Quit date: 04/25/1994    Years since quitting: 26.0  . Smokeless tobacco: Never Used  Substance and Sexual Activity  . Alcohol use: Yes    Alcohol/week: 2.0 standard drinks    Types: 2 Cans of beer per week    Comment: 1-2 drink a week  . Drug use: No  . Sexual activity: Yes  Other Topics Concern  . Not on file  Social History Narrative  . Not on file   Social Determinants of Health   Financial Resource Strain: Not on file  Food Insecurity: Not on file  Transportation Needs: Not on file  Physical Activity: Not on file  Stress: Not on file  Social Connections: Not on file  Intimate Partner Violence: Not on file    Family History:    Family History  Problem Relation Age of Onset  . Transient ischemic attack Father   . Multiple  myeloma Father   . Bladder Cancer Father   . Celiac disease Mother   . Scoliosis Mother   . Colon cancer Neg Hx   . Esophageal cancer Neg Hx   . Liver disease Neg Hx   . Kidney disease Neg Hx   . Colon polyps Neg Hx      ROS:  Please see the history of present illness.  All other ROS reviewed and negative.     Physical Exam/Data:   Vitals:   05/03/20 0917  BP: 122/88  Pulse: 88  SpO2: 97%  Weight: 187 lb 6.4 oz (85 kg)  Height: 6\' 4"  (1.93 m)   @IOBRIEF @ Last 3 Weights 05/03/2020 03/16/2020 01/13/2020  Weight (lbs) 187 lb 6.4 oz 181 lb 12.8 oz 175 lb 6.4 oz  Weight (kg) 85.004 kg 82.464 kg 79.561 kg     Body mass index is 22.81 kg/m.  General:  Well nourished, well developed, in no acute distress.  He appears lean and fit HEENT: normal Lymph: no adenopathy Neck: no JVD Endocrine:  No thryomegaly Vascular: No carotid bruits; FA pulses 2+ bilaterally without bruits  Cardiac:  normal S1, S2; RRR baseline with very frequent ectopic beats; no murmur Lungs:  clear to auscultation bilaterally, no wheezing, rhonchi or rales  Abd: soft, nontender, no hepatomegaly  Ext: no edema Musculoskeletal:  No deformities, BUE and BLE strength normal and equal Skin: warm and dry  Neuro:  CNs 2-12 intact, no focal abnormalities noted Psych:  Normal affect   EKG:  The EKG was personally reviewed and demonstrates: Sinus rhythm with very frequent PACs and suspicion for left atrial abnormality.  There are no repolarization changes.  Normal QT interval.  The ECG from 03/16/2020 shows very similar findings.  There is no atrial fibrillation, but there are frequent PACs.   Relevant CV Studies:   Laboratory Data:  High Sensitivity Troponin:  No results for input(s): TROPONINIHS in the last 720 hours.   ChemistryNo results for input(s): NA, K, CL, CO2, GLUCOSE, BUN, CREATININE, CALCIUM, GFRNONAA, GFRAA, ANIONGAP in the last 168 hours.  No results for input(s): PROT, ALBUMIN, AST, ALT, ALKPHOS,  BILITOT in the last 168 hours. HematologyNo results for input(s): WBC, RBC, HGB, HCT, MCV, MCH, MCHC, RDW, PLT  in the last 168 hours. BNPNo results for input(s): BNP, PROBNP in the last 168 hours.  DDimer No results for input(s): DDIMER in the last 168 hours.  06/29/2019 Total cholesterol 161, HDL 52, LDL 96, triglycerides 64  08/27/2019 Hemoglobin A1c 5.8%  01/09/2020 Iron 19, saturation ratio 4%, ferritin 28 Hemoglobin 9.6, otherwise normal CBC  01/13/2020 Creatinine 1.07, potassium 4., ALT 39  Radiology/Studies:  No results found.   Assessment and Plan:   1. PACs: Although these are generally a benign abnormality, their frequency is quite striking and there is a family history of atrial fibrillation in several family members.  If atrial fibrillation is identified, Mr. Tawni Millers would have a borderline indication for anticoagulation (hypertension, just turned 64 so CHA2DS2-VASc score is recently 2).  He has iron deficiency anemia that is likely related to GI blood loss, possibly due to the use of NSAIDs or due to GERD.  I would not start him on anticoagulation at this point even if we identify atrial fibrillation.  I think would be more important to see if identify any underlying structural heart disease, evidence of left atrial enlargement on echocardiography, valvular disease, etc.  We will schedule him for an echocardiogram. 2. HTN: Excellent control on a low-dose of a single antihypertensive agent. 3. Iron deficiency anemia: Remote colonoscopy in 2016 did not show pathology that could explain this.  He has GERD and takes NSAIDs frequently so is quite possible he has an upper GI source of bleeding.  May need upper endoscopy.  May also need to be on a higher dose of iron supplement.  I think it would be important to clear this up before we started anticoagulation even if we do identify atrial fibrillation on further work-up.  Patient Instructions  Medication Instructions:  No changes *If  you need a refill on your cardiac medications before your next appointment, please call your pharmacy*   Lab Work: None ordered If you have labs (blood work) drawn today and your tests are completely normal, you will receive your results only by: Marland Kitchen MyChart Message (if you have MyChart) OR . A paper copy in the mail If you have any lab test that is abnormal or we need to change your treatment, we will call you to review the results.   Testing/Procedures: Your physician has requested that you have an echocardiogram. Echocardiography is a painless test that uses sound waves to create images of your heart. It provides your doctor with information about the size and shape of your heart and how well your heart's chambers and valves are working. You may receive an ultrasound enhancing agent through an IV if needed to better visualize your heart during the echo.This procedure takes approximately one hour. There are no restrictions for this procedure. This will take place at the [address], [address].     Follow-Up: At [facility], you and your health needs are our priority.  As part of our continuing mission to provide you with exceptional heart care, we have created designated Provider Care Teams.  These Care Teams include your primary Cardiologist (physician) and Advanced Practice Providers (APPs -  Physician Assistants and Nurse Practitioners) who all work together to provide you with the care you need, when you need it.  We recommend signing up for the patient portal called "MyChart".  Sign up information is provided on this After Visit Summary.  MyChart is used to connect with patients for Virtual Visits (Telemedicine).  Patients are able to view  lab/test results, encounter notes, upcoming appointments, etc.  Non-urgent messages can be sent to your provider as well.   To learn more about what you can do with MyChart, go to [email or web address].    Your next appointment:   Follow up  as needed with Dr. Sallyanne Kuster     For questions or updates, please contact [facility] Please consult [email or web address] for contact info under    Signed, Sanda Klein, MD  05/08/2020 3:51 PM

## 2020-05-17 ENCOUNTER — Ambulatory Visit (INDEPENDENT_AMBULATORY_CARE_PROVIDER_SITE_OTHER): Payer: 59 | Admitting: Family Medicine

## 2020-05-17 ENCOUNTER — Other Ambulatory Visit: Payer: Self-pay

## 2020-05-17 ENCOUNTER — Encounter: Payer: Self-pay | Admitting: Family Medicine

## 2020-05-17 VITALS — BP 126/78 | HR 61 | Temp 98.3°F | Wt 181.6 lb

## 2020-05-17 DIAGNOSIS — R945 Abnormal results of liver function studies: Secondary | ICD-10-CM

## 2020-05-17 DIAGNOSIS — I10 Essential (primary) hypertension: Secondary | ICD-10-CM

## 2020-05-17 DIAGNOSIS — D508 Other iron deficiency anemias: Secondary | ICD-10-CM

## 2020-05-17 NOTE — Progress Notes (Signed)
   Subjective:    [name], male    DOB: [birth year], 65 y.o.   MRN: [id number]  HPI He is here for a recheck.  He recently saw his cardiologist.  That note was reviewed with him.  He continues on losartan and is having no difficulty with that.  Does have a history of iron deficiency anemia but has had no difficulty with black tarry stools, abdominal pain, nausea or vomiting.  He has had a previous colonoscopy.  He does use Celebrex but has never had trouble with that in the past. Does have a history of abnormal liver functions.  Review of Systems     Objective:   Physical Exam Alert and in no distress otherwise not examined blood pressure is recorded.       Assessment & Plan:  Hypertension, unspecified type  Abnormal liver function - Plan: Comprehensive metabolic panel  Other iron deficiency anemia - Plan: CBC with Differential/Platelet Continue with present blood pressure medication.  I will check blood work on him and if his anemia is still present, referral to GI would be appropriate.  He was comfortable with that.

## 2020-05-18 LAB — CBC WITH DIFFERENTIAL/PLATELET
Basophils Absolute: 0.1 10*3/uL (ref 0.0–0.2)
Basos: 1 %
EOS (ABSOLUTE): 0.2 10*3/uL (ref 0.0–0.4)
Eos: 3 %
Hematocrit: 36.4 % — ABNORMAL LOW (ref 37.5–51.0)
Hemoglobin: 12 g/dL — ABNORMAL LOW (ref 13.0–17.7)
Immature Grans (Abs): 0 10*3/uL (ref 0.0–0.1)
Immature Granulocytes: 0 %
Lymphocytes Absolute: 1.6 10*3/uL (ref 0.7–3.1)
Lymphs: 27 %
MCH: 30.2 pg (ref 26.6–33.0)
MCHC: 33 g/dL (ref 31.5–35.7)
MCV: 92 fL (ref 79–97)
Monocytes Absolute: 0.8 10*3/uL (ref 0.1–0.9)
Monocytes: 14 %
Neutrophils Absolute: 3.2 10*3/uL (ref 1.4–7.0)
Neutrophils: 55 %
Platelets: 312 10*3/uL (ref 150–450)
RBC: 3.98 x10E6/uL — ABNORMAL LOW (ref 4.14–5.80)
RDW: 12.7 % (ref 11.6–15.4)
WBC: 5.8 10*3/uL (ref 3.4–10.8)

## 2020-05-18 LAB — COMPREHENSIVE METABOLIC PANEL
ALT: 19 IU/L (ref 0–44)
AST: 28 IU/L (ref 0–40)
Albumin/Globulin Ratio: 1.9 (ref 1.2–2.2)
Albumin: 4.1 g/dL (ref 3.8–4.8)
Alkaline Phosphatase: 86 IU/L (ref 44–121)
BUN/Creatinine Ratio: 14 (ref 10–24)
BUN: 16 mg/dL (ref 8–27)
Bilirubin Total: 0.3 mg/dL (ref 0.0–1.2)
CO2: 21 mmol/L (ref 20–29)
Calcium: 9.1 mg/dL (ref 8.6–10.2)
Chloride: 99 mmol/L (ref 96–106)
Creatinine, Ser: 1.11 mg/dL (ref 0.76–1.27)
GFR calc Af Amer: 80 mL/min/{1.73_m2} (ref >59)
GFR calc non Af Amer: 69 mL/min/{1.73_m2} (ref >59)
Globulin, Total: 2.2 g/dL (ref 1.5–4.5)
Glucose: 93 mg/dL (ref 65–99)
Potassium: 4.5 mmol/L (ref 3.5–5.2)
Sodium: 135 mmol/L (ref 134–144)
Total Protein: 6.3 g/dL (ref 6.0–8.5)

## 2020-05-23 ENCOUNTER — Other Ambulatory Visit: Payer: Self-pay | Admitting: Neurology

## 2020-05-23 NOTE — Progress Notes (Signed)
NEUROLOGY FOLLOW UP OFFICE NOTE  Quentez Lober Kiang [id number]  Assessment/Plan:   1.  Cervicogenic headache 2.  Cervical spinal stenosis with degenerative disc disease and spondylosis 3.  Chronic low back pain  1.  Increase nortriptyline to 58m at bedtime 2.  Follow up one year  Subjective:  EYarden [name] a 64year old right-handed man who follows up for cervicogenic headache with cervical spondylosis.  UPDATE: For headache, he is taking nortriptyline572mat bedtime.For neck pain, he takes Tylenol and Soma. He uses a traction unit on the neck.He sometimes has neck pain but no headaches.  He is interested in increasing nortriptyline to perhaps help with some of his other neurogenic pain.   HISTORY: In August 2016, he began experiencing bilateral posterior neck pain radiatingup to the upper cervical region and down to between the shoulder blades.He describes it as an aching pain.About 3 days a week, it is accompanied by a headache in a band-like distribution.He also reports burning sensation at the C7 spinal process.He reports that a week prior to onset, he performed manual labor in the yard, such as chopping wood and pushing a wheel barrel of dirt.He has undergone 5 weeks of PT with moderate relief.The neck pain seems a little worse after a session.Sometimes he notes numbness in the left arm and hand.  Plain films of cervical spine demonstrated multilevel degenerative disc disease with multiple osseous neural foraminal narrowing, most pronounced at C4-5, C5-6 and C6-7.Craniocervical junctuion was unremarkable.MRI of cervical spine from 01/14/15.It reveals multilevel spondylosis moderate left C3-4 foraminal stenosis, moderate to severe bilateral C5-6 foraminal narrowing and severe left and moderate right C6-7 foraminal stenosis.There is mild central stenosis with moderate left central stenosis at C6-7.Repeat MRI of cervical spine from 06/12/2018 showed  progressed multilevel degenerative changes with neural foraminal stenosis as well as moderate canal stenosis at C3-4.  He was evaluated by spine surgeon who sent him toDr. [facility] management. He received an epidural which was helpful.  In [place] 2021, he reported very brief disorientation while driving.  At an intersection, it may take a couple of seconds to get his bearings.  It really only occurs at night, not during the day.  Also, he may sometimes have word-finding difficulty or can't recall somebody's name right away. B12 and TSH from March 2021 were 441 and 1.20 respectively.    PAST MEDICAL HISTORY: Past Medical History:  Diagnosis Date  . Alopecia   . DDD (degenerative disc disease)   . Degenerative scoliosis 07/07/2018  . Diverticulosis   . ED (erectile dysfunction)   . GERD (gastroesophageal reflux disease)   [facility]   sees Dr. [place] Crane back and chest  . Neurogenic claudication ([facility]  . Renal stone   . Spinal stenosis of lumbar region 07/07/2018    MEDICATIONS: Current Outpatient Medications on File Prior to Visit  Medication Sig Dispense Refill  . Ascorbic Acid (VITAMIN C) 1000 MG tablet Take 1,000 mg by mouth daily.    . Marland Kitchenugmented betamethasone dipropionate (DIPROLENE-AF) 0.05 % cream Apply 1 application topically 2 (two) times daily.     . carisoprodol (SOMA) 350 MG tablet Take 1 tablet (350 mg total) by mouth daily as needed for muscle spasms. 30 tablet 5  . celecoxib (CELEBREX) 200 MG capsule TAKE 1 CAPSULE BY MOUTH TWICE A DAY (Patient taking differently: Take 200 mg by mouth 2 (two) times daily.) 60 capsule 5  . clobetasol cream (TEMOVATE) 0.05 %     . cyclobenzaprine (  FLEXERIL) 10 MG tablet Take 10 mg by mouth 2 (two) times daily as needed. (Patient not taking: Reported on 05/17/2020)    . Echinacea 400 MG CAPS Take 1 capsule by mouth daily.     . ferrous sulfate 325 (65 FE) MG tablet Take 1 tablet (325 mg total) by mouth daily. 30 tablet 3  .  finasteride (PROPECIA) 1 MG tablet Take 0.5 mg by mouth daily.     Marland Kitchen glucosamine-chondroitin 500-400 MG tablet Take 1 tablet by mouth in the morning and at bedtime.     Javier Docker Oil 1000 MG CAPS Take 1 capsule by mouth daily.     Marland Kitchen losartan (COZAAR) 50 MG tablet Take 1 tablet (50 mg total) by mouth daily. 90 tablet 3  . MELATONIN PO Take 10 mg by mouth at bedtime.     . Multiple Vitamins-Minerals (MULTIVITAMIN WITH MINERALS) tablet Take 1 tablet by mouth daily.    . nortriptyline (PAMELOR) 50 MG capsule TAKE 1 CAPSULE BY MOUTH EVERYDAY AT BEDTIME (Patient taking differently: Take 50 mg by mouth at bedtime.) 90 capsule 1  . tadalafil (CIALIS) 20 MG tablet TAKE 1 TABLET BY MOUTH EVERY DAY AS NEEDED FOR ERECTILE DYSFUNCTION (Patient taking differently: Take 20 mg by mouth daily as needed for erectile dysfunction.) 30 tablet 5  . TURMERIC PO Take 1 tablet by mouth daily.     Marland Kitchen VITAMIN D PO Take 1 tablet by mouth daily.     . vitamin E 100 UNIT capsule Take 100 Units by mouth daily.     . Zinc 50 MG TABS Take 1 tablet by mouth daily.     Marland Kitchen zolpidem (AMBIEN) 10 MG tablet TAKE 1 TABLET BY MOUTH EVERY DAY AT BEDTIME AS NEEDED FOR SLEEP 30 tablet 1   No current facility-administered medications on file prior to visit.    ALLERGIES: Allergies  Allergen Reactions  . Daypro [Oxaprozin]     GASTRIC PROBLEMS  . Methadone Itching    FAMILY HISTORY: Family History  Problem Relation Age of Onset  . Transient ischemic attack Father   . Multiple myeloma Father   . Bladder Cancer Father   . Celiac disease Mother   . Scoliosis Mother   . Colon cancer Neg Hx   . Esophageal cancer Neg Hx   . Liver disease Neg Hx   . Kidney disease Neg Hx   . Colon polyps Neg Hx      Objective:  Blood pressure 117/84, pulse (!) 104, resp. rate 18, height 6' 4"  (1.93 m), weight 182 lb (82.6 kg), SpO2 97 %. General: No acute distress.  Patient appears well-groomed.   Head:  Normocephalic/atraumatic Eyes:  Fundi  examined but not visualized Neck: supple, no paraspinal tenderness, full range of motion Heart:  Regular rate and rhythm Lungs:  Clear to auscultation bilaterally Back: No paraspinal tenderness Neurological Exam: alert and oriented to person, place, and time. Attention span and concentration intact, recent and remote memory intact, fund of knowledge intact.  Speech fluent and not dysarthric, language intact.  CN II-XII intact. Bulk and tone normal, muscle strength 5/5 throughout.  Sensation to light touch intact.  Deep tendon reflexes 2+ throughout, toes downgoing.  Finger to nose and heel to shin testing intact.  Gait normal, Romberg negative.     Metta Clines, DO  CC: Jill Alexanders, MD

## 2020-05-24 ENCOUNTER — Encounter: Payer: Self-pay | Admitting: Neurology

## 2020-05-24 ENCOUNTER — Other Ambulatory Visit: Payer: Self-pay

## 2020-05-24 ENCOUNTER — Ambulatory Visit: Payer: 59 | Admitting: Neurology

## 2020-05-24 VITALS — BP 117/84 | HR 104 | Resp 18 | Ht 76.0 in | Wt 182.0 lb

## 2020-05-24 DIAGNOSIS — M47812 Spondylosis without myelopathy or radiculopathy, cervical region: Secondary | ICD-10-CM

## 2020-05-24 DIAGNOSIS — G4486 Cervicogenic headache: Secondary | ICD-10-CM

## 2020-05-24 DIAGNOSIS — G8929 Other chronic pain: Secondary | ICD-10-CM

## 2020-05-24 DIAGNOSIS — M545 Low back pain, unspecified: Secondary | ICD-10-CM | POA: Diagnosis not present

## 2020-05-24 MED ORDER — NORTRIPTYLINE HCL 75 MG PO CAPS: 75.0000 mg | ORAL_CAPSULE | Freq: Every day | ORAL | 5 refills | Status: DC

## 2020-05-24 NOTE — Patient Instructions (Signed)
1.  Increased nortriptyline to 75mg  at bedtime 2.  Follow up one year

## 2020-05-25 ENCOUNTER — Ambulatory Visit (HOSPITAL_COMMUNITY): Payer: 59 | Attending: Cardiology

## 2020-05-25 DIAGNOSIS — I491 Atrial premature depolarization: Secondary | ICD-10-CM | POA: Diagnosis present

## 2020-05-25 LAB — ECHOCARDIOGRAM COMPLETE: S' Lateral: 2.5 cm

## 2020-05-27 ENCOUNTER — Other Ambulatory Visit: Payer: Self-pay | Admitting: Family Medicine

## 2020-05-30 NOTE — Telephone Encounter (Signed)
[facility]. Please advise [facility]

## 2020-06-12 ENCOUNTER — Other Ambulatory Visit: Payer: Self-pay | Admitting: Family Medicine

## 2020-06-12 ENCOUNTER — Other Ambulatory Visit: Payer: Self-pay | Admitting: Neurology

## 2020-06-13 NOTE — Telephone Encounter (Signed)
[facility]. Please advise KH 

## 2020-06-24 ENCOUNTER — Other Ambulatory Visit: Payer: Self-pay | Admitting: Medical

## 2020-06-27 NOTE — Telephone Encounter (Signed)
yours

## 2020-06-27 NOTE — Telephone Encounter (Signed)
Is this ok to refill, reply to [facility]

## 2020-08-30 ENCOUNTER — Other Ambulatory Visit: Payer: Self-pay

## 2020-08-30 ENCOUNTER — Encounter: Payer: Self-pay | Admitting: Family Medicine

## 2020-08-30 ENCOUNTER — Ambulatory Visit: Payer: 59 | Admitting: Family Medicine

## 2020-08-30 VITALS — BP 120/80 | HR 91 | Temp 98.2°F | Ht 75.0 in | Wt 182.8 lb

## 2020-08-30 DIAGNOSIS — I491 Atrial premature depolarization: Secondary | ICD-10-CM | POA: Diagnosis not present

## 2020-08-30 DIAGNOSIS — N5201 Erectile dysfunction due to arterial insufficiency: Secondary | ICD-10-CM

## 2020-08-30 DIAGNOSIS — D509 Iron deficiency anemia, unspecified: Secondary | ICD-10-CM

## 2020-08-30 DIAGNOSIS — M4180 Other forms of scoliosis, site unspecified: Secondary | ICD-10-CM

## 2020-08-30 DIAGNOSIS — M545 Low back pain, unspecified: Secondary | ICD-10-CM

## 2020-08-30 DIAGNOSIS — K219 Gastro-esophageal reflux disease without esophagitis: Secondary | ICD-10-CM

## 2020-08-30 DIAGNOSIS — R7302 Impaired glucose tolerance (oral): Secondary | ICD-10-CM

## 2020-08-30 DIAGNOSIS — G4709 Other insomnia: Secondary | ICD-10-CM

## 2020-08-30 DIAGNOSIS — Z23 Encounter for immunization: Secondary | ICD-10-CM | POA: Diagnosis not present

## 2020-08-30 DIAGNOSIS — M48062 Spinal stenosis, lumbar region with neurogenic claudication: Secondary | ICD-10-CM

## 2020-08-30 DIAGNOSIS — G8929 Other chronic pain: Secondary | ICD-10-CM

## 2020-08-30 DIAGNOSIS — D508 Other iron deficiency anemias: Secondary | ICD-10-CM | POA: Diagnosis not present

## 2020-08-30 DIAGNOSIS — D649 Anemia, unspecified: Secondary | ICD-10-CM | POA: Insufficient documentation

## 2020-08-30 DIAGNOSIS — Z85820 Personal history of malignant melanoma of skin: Secondary | ICD-10-CM

## 2020-08-30 DIAGNOSIS — I10 Essential (primary) hypertension: Secondary | ICD-10-CM | POA: Insufficient documentation

## 2020-08-30 DIAGNOSIS — Z0000 Encounter for general adult medical examination without abnormal findings: Secondary | ICD-10-CM | POA: Diagnosis not present

## 2020-08-30 DIAGNOSIS — D692 Other nonthrombocytopenic purpura: Secondary | ICD-10-CM

## 2020-08-30 DIAGNOSIS — M4150 Other secondary scoliosis, site unspecified: Secondary | ICD-10-CM

## 2020-08-30 DIAGNOSIS — M5090 Cervical disc disorder, unspecified, unspecified cervical region: Secondary | ICD-10-CM

## 2020-08-30 MED ORDER — TRAZODONE HCL 50 MG PO TABS: ORAL_TABLET | ORAL | 1 refills | Status: DC

## 2020-08-30 MED ORDER — TADALAFIL 20 MG PO TABS: ORAL_TABLET | ORAL | 5 refills | Status: DC

## 2020-08-30 NOTE — Progress Notes (Signed)
   Subjective:    [name], male    DOB: 12-07-1955, 65 y.o.   MRN: [id number]  HPI He is here for complete examination.  He continues to deal with chronic back pain with associated scoliosis as well as spinal stenosis.  He also has cervical disc disease as well.  He is seen by orthopedics for this and has had injections in the past.  He does have reflux disease and does use an H2 blocker for relief of that with good results.  He is working different shifts and does require sleep medications.  In the past he has been using Ambien fairly regularly.  He does have underlying ADD and would like a refill on his Cialis.  Does have a history of irregular heart rate and has been seen by cardiology.  He did show evidence of PACs.  He presently has no symptoms on that.  Does have a previous history of difficulty with anemia.  He continues to work and is considering retiring but no time soon.  Family and social history as well as health maintenance and immunizations was reviewed   Review of Systems  All other systems reviewed and are negative.      Objective:   Physical Exam Alert and in no distress. Tympanic membranes and canals are normal. Pharyngeal area is normal. Neck is supple without adenopathy or thyromegaly. Cardiac exam shows an irregular  rhythm without murmurs or gallops. Lungs are clear to auscultation.  Skin does show purpuric lesions on his arms. EKG read by me shows a rate of 94 with other parameters normal .  PACs are evident.        Assessment & Plan:  Routine general medical examination at a health care facility - Plan: CBC with Differential/Platelet, Comprehensive metabolic panel, Lipid panel  Immunization, viral disease - Plan: PFIZER Comirnaty(GRAY TOP)COVID-19 Vaccine  Need for vaccination against Streptococcus pneumoniae - Plan: Pneumococcal conjugate vaccine 13-valent  Hypertension, unspecified type  Other iron deficiency anemia  PAC (premature atrial  contraction)  Iron deficiency anemia, unspecified iron deficiency anemia type  History of melanoma  Erectile dysfunction due to arterial insufficiency - Plan: tadalafil (CIALIS) 20 MG tablet  Chronic bilateral low back pain without sciatica  Gastroesophageal reflux disease without esophagitis - Plan: tadalafil (CIALIS) 20 MG tablet  Glucose intolerance (impaired glucose tolerance)  Cervical disc disease  Senile purpura (HCC)  Degenerative scoliosis  Spinal stenosis of lumbar region with neurogenic claudication  Premature atrial contractions  Other insomnia - Plan: traZODone (DESYREL) 50 MG tablet  I explained that there is no concern for the PACs.  Did recommend that we stop the Ambien and switch him to does a real and have him use a half a pill on an as-needed basis.  He will try to only use it when he is working third shift and needs to help with sleep.  He will continue to be followed by orthopedics for his multiple arthritic stenotic and scoliotic problems.  Follow-up on his anemia and glucose based on that.

## 2020-08-31 LAB — COMPREHENSIVE METABOLIC PANEL
ALT: 23 IU/L (ref 0–44)
AST: 25 IU/L (ref 0–40)
Albumin/Globulin Ratio: 1.7 (ref 1.2–2.2)
Albumin: 4.2 g/dL (ref 3.8–4.8)
Alkaline Phosphatase: 111 IU/L (ref 44–121)
BUN/Creatinine Ratio: 9 — ABNORMAL LOW (ref 10–24)
BUN: 10 mg/dL (ref 8–27)
Bilirubin Total: 0.3 mg/dL (ref 0.0–1.2)
CO2: 23 mmol/L (ref 20–29)
Calcium: 9.4 mg/dL (ref 8.6–10.2)
Chloride: 97 mmol/L (ref 96–106)
Creatinine, Ser: 1.06 mg/dL (ref 0.76–1.27)
Globulin, Total: 2.5 g/dL (ref 1.5–4.5)
Glucose: 99 mg/dL (ref 65–99)
Potassium: 4.5 mmol/L (ref 3.5–5.2)
Sodium: 135 mmol/L (ref 134–144)
Total Protein: 6.7 g/dL (ref 6.0–8.5)
eGFR: 77 mL/min/{1.73_m2} (ref >59)

## 2020-08-31 LAB — CBC WITH DIFFERENTIAL/PLATELET
Basophils Absolute: 0.1 10*3/uL (ref 0.0–0.2)
Basos: 1 %
EOS (ABSOLUTE): 0.3 10*3/uL (ref 0.0–0.4)
Eos: 4 %
Hematocrit: 38.1 % (ref 37.5–51.0)
Hemoglobin: 12.6 g/dL — ABNORMAL LOW (ref 13.0–17.7)
Immature Grans (Abs): 0 10*3/uL (ref 0.0–0.1)
Immature Granulocytes: 0 %
Lymphocytes Absolute: 1.5 10*3/uL (ref 0.7–3.1)
Lymphs: 24 %
MCH: 29.6 pg (ref 26.6–33.0)
MCHC: 33.1 g/dL (ref 31.5–35.7)
MCV: 89 fL (ref 79–97)
Monocytes Absolute: 0.9 10*3/uL (ref 0.1–0.9)
Monocytes: 15 %
Neutrophils Absolute: 3.4 10*3/uL (ref 1.4–7.0)
Neutrophils: 56 %
Platelets: 359 10*3/uL (ref 150–450)
RBC: 4.26 x10E6/uL (ref 4.14–5.80)
RDW: 12.4 % (ref 11.6–15.4)
WBC: 6.2 10*3/uL (ref 3.4–10.8)

## 2020-08-31 LAB — LIPID PANEL
Chol/HDL Ratio: 3.7 ratio (ref 0.0–5.0)
Cholesterol, Total: 199 mg/dL (ref 100–199)
HDL: 54 mg/dL (ref >39)
LDL Chol Calc (NIH): 132 mg/dL — ABNORMAL HIGH (ref 0–99)
Triglycerides: 74 mg/dL (ref 0–149)
VLDL Cholesterol Cal: 13 mg/dL (ref 5–40)

## 2020-09-01 ENCOUNTER — Encounter: Payer: Self-pay | Admitting: Family Medicine

## 2020-09-01 NOTE — Addendum Note (Signed)
Addended by: Denita Lung on: 09/01/2020 08:53 AM   Modules accepted: Orders

## 2020-09-02 NOTE — Progress Notes (Signed)
LVM for pt to call back for results [facility]

## 2020-09-04 ENCOUNTER — Other Ambulatory Visit: Payer: Self-pay | Admitting: Neurology

## 2020-09-05 NOTE — Progress Notes (Signed)
LVM for pt to advise of labs. [place]

## 2020-09-09 ENCOUNTER — Telehealth: Payer: Self-pay

## 2020-09-09 NOTE — Telephone Encounter (Signed)
Pt. Called stating that the trazodone that he was switched to is causing his muscles to spasm and twitch. It said it was actually harder to sleep on this medication because of the leg twitching. He wanted to know if he could go back on the Ambien or something different for sleep but he would not be taking trazodone anymore.

## 2020-09-10 MED ORDER — BELSOMRA 15 MG PO TABS: 15.0000 mg | ORAL_TABLET | Freq: Every evening | ORAL | 1 refills | Status: DC | PRN

## 2020-10-08 ENCOUNTER — Other Ambulatory Visit: Payer: Self-pay | Admitting: Family Medicine

## 2020-10-10 ENCOUNTER — Other Ambulatory Visit: Payer: Self-pay | Admitting: Family Medicine

## 2020-10-14 ENCOUNTER — Encounter: Payer: Self-pay | Admitting: Family Medicine

## 2020-10-14 ENCOUNTER — Other Ambulatory Visit: Payer: Self-pay

## 2020-10-14 ENCOUNTER — Telehealth: Payer: 59 | Admitting: Family Medicine

## 2020-10-14 VITALS — Temp 98.7°F | Wt 182.0 lb

## 2020-10-14 DIAGNOSIS — U071 COVID-19: Secondary | ICD-10-CM

## 2020-10-14 NOTE — Progress Notes (Signed)
   Subjective:    [name], male    DOB: 02-21-1956, 65 y.o.   MRN: [id number]  HPI Documentation for virtual audio  telecommunications through [place] encounter: Patient was unable to get set up for video The patient was located at home. 2 patient identifiers used.  The provider was located in the office. The patient did consent to this visit and is aware of possible charges through their insurance for this visit. The other persons participating in this telemedicine service were none. Time spent on call was 10 minutes and in review of previous records >20 minutes total for counseling and coordination of care. This virtual service is not related to other E/M service within previous 7 days.  He states that on Tuesday he developed postnasal drainage, sore throat, slight cough, sneezing and today noted decrease in taste.  He tested and was positive.  He has had the 4 COVID vaccines.  No fever, shortness of breath, malaise. Review of Systems     Objective:   Physical Exam Alert and in no distress with a hoarse voice.       Assessment & Plan:  COVID-19 I explained that his 5-day window will be up and he can return to normal activity on Monday as long as he is feeling better.  He is to treat himself symptomatically and if he gets worse in terms of fever, worsening cough and shortness of breath, he is to call me.  Explained that we could then place him on Paxil but if needed.  Discussed exposure to his mother who apparently is living with.  She has apparently been vaccinated as well.  He will call if any questions.

## 2020-11-08 ENCOUNTER — Other Ambulatory Visit: Payer: Self-pay | Admitting: Neurology

## 2021-01-09 ENCOUNTER — Other Ambulatory Visit: Payer: Self-pay | Admitting: Family Medicine

## 2021-01-09 DIAGNOSIS — I10 Essential (primary) hypertension: Secondary | ICD-10-CM

## 2021-01-10 ENCOUNTER — Other Ambulatory Visit: Payer: Self-pay | Admitting: Family Medicine

## 2021-01-10 NOTE — Telephone Encounter (Signed)
[facility]. Please advise [facility]

## 2021-02-09 ENCOUNTER — Other Ambulatory Visit: Payer: Self-pay | Admitting: Family Medicine

## 2021-02-09 NOTE — Telephone Encounter (Signed)
[facility]. Please advise [facility]

## 2021-03-21 ENCOUNTER — Telehealth: Payer: Self-pay

## 2021-03-21 NOTE — Telephone Encounter (Signed)
° °  Pre-operative Risk Assessment    Patient Name: Joseph [name]  DOB: [birth year] MRN: [id number]      Request for Surgical Clearance    Procedure:   RIGHT TOTAL KNEE ARTHROPLASTY  Date of Surgery:  Clearance TBD                                 Surgeon:  DR Lolita Lenz  Surgeon's Group or Practice Name:  [facility] Phone number:  [phone] Fax number:  [phone]   Type of Clearance Requested:   - Medical    Type of Anesthesia:  Spinal {

## 2021-03-21 NOTE — Telephone Encounter (Signed)
Primary Cardiologist:Mihai Croitoru, MD  Chart reviewed as part of pre-operative protocol coverage. Because of Joseph [name]'s past medical history and time since last visit, he/she will require a follow-up visit in order to better assess preoperative cardiovascular risk.  Pre-op covering staff: - Please schedule appointment and call patient to inform them. - Please contact requesting surgeon's office via preferred method (i.e, phone, fax) to inform them of need for appointment prior to surgery.  If applicable, this message will also be routed to pharmacy pool and/or primary cardiologist for input on holding anticoagulant/antiplatelet agent as requested below so that this information is available at time of patient's appointment.   [name], NP  03/21/2021, 9:08 AM

## 2021-03-21 NOTE — Telephone Encounter (Signed)
1st attempt to reach pt to schedule appointment for clearance. No answer. LVM

## 2021-03-22 NOTE — Telephone Encounter (Signed)
Pt is scheduled for surgical clearance appt with Phineas Inches, MD on 1/4.

## 2021-03-29 ENCOUNTER — Other Ambulatory Visit: Payer: Self-pay

## 2021-03-29 ENCOUNTER — Encounter: Payer: Self-pay | Admitting: Internal Medicine

## 2021-03-29 ENCOUNTER — Ambulatory Visit (INDEPENDENT_AMBULATORY_CARE_PROVIDER_SITE_OTHER): Payer: 59

## 2021-03-29 ENCOUNTER — Ambulatory Visit: Payer: 59 | Admitting: Internal Medicine

## 2021-03-29 VITALS — BP 130/88 | HR 103 | Ht 75.0 in | Wt 187.0 lb

## 2021-03-29 DIAGNOSIS — I471 Supraventricular tachycardia: Secondary | ICD-10-CM

## 2021-03-29 MED ORDER — METOPROLOL SUCCINATE ER 25 MG PO TB24: 25.0000 mg | ORAL_TABLET | Freq: Every day | ORAL | 3 refills | Status: DC

## 2021-03-29 NOTE — Progress Notes (Signed)
Cardiology Consultation:   Patient ID: Tison Leibold Bettcher MRN: [id number]; DOB: 01-04-1956  Admit date: (Not on file) Date of Consult: 03/29/2021  Primary Care Provider: Denita Lung, MD [facility] Cardiologist: Sanda Klein, MD [facility] Electrophysiologist:  None    Patient Profile:   Muad Noga Riehle is a 66 y.o. male with a hx of HTN, lumbar spine stenosis, otherwise generally excellent health who was being seen for the evaluation of arrhythmia at the request of Dr. Redmond School.  History of Present Illness:   Prior HPI Mr. Taras was noted to have an irregular rhythm during physical exam with his primary care provider.  An ECG was performed in the computer algorithm diagnosed "atrial fibrillation" and he was therefore referred for evaluation.  However, on my review of the ECG he appears to have sinus rhythm with very frequent premature atrial contractions (5 PACs on the 10-second ECG strip).  Similarly today he has very frequent ectopy on physical exam and his ECG shows premature atrial contractions. He is completely unaware of the irregularity of his heartbeat.   He is quite active and denies any cardiovascular complaints.The patient specifically denies any chest pain at rest exertion, dyspnea at rest or with exertion, orthopnea, paroxysmal nocturnal dyspnea, syncope, palpitations, focal neurological deficits, intermittent claudication, lower extremity edema, unexplained weight gain, cough, hemoptysis or wheezing.  He is physically active and does not have any limitations to his usual activities from any cardiovascular complaints.  His brother has a history of atrial fibrillation on a background of obesity, alcohol abuse, congestive heart failure.  He has a defibrillator.  There maternal grandfather had congestive heart failure and died in his 70s.  A paternal uncle also has atrial fibrillation, age of onset unclear.  A maternal uncle had a myocardial infarction in his 51s.   Their dad died of lymphoma.  Mom had severe scoliosis and celiac disease.  There is no family history of stroke as far as I can tell.  Most of [name] health problems have been related to degenerative disc disease with lumbar spine stenosis and degenerative scoliosis with secondary neurogenic claudication.  Last significant symptoms are related to GERD and erectile dysfunction.  He had laser ablation of varicose veins of the lower extremities several years ago (left leg worse than right).  He quit smoking about 20 years ago and is a very light consumer of alcohol (at most 1 or 2 beers a week).  He does not drink a lot of coffee or other caffeinated beverages.  A recent lipid profile showed LDL of 96 with excellent HDL cholesterol and a borderline hemoglobin A1c of 5.8%.  He has normal renal function.  He has mild normocytic normochromic anemia, with labs last October that confirm iron deficiency.  This was diagnosed incidentally during a hospitalization for upper extremity cellulitis following an injury.  Iron supplementation was started, but he remains anemic (possibly due to the fact that he is taking a very low-dose of iron 325 mg once daily).  He had a colonoscopy in 2016 with a single sessile polyp without adenomatous or malignant findings.  Interim Hx 03/29/2021: Comes in today for a pre-op risk stratification. Cardiac hx mainly notable for PACs. Echo shows normal LV function. 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  He notes fast heart beats in the AM, then it slows down. He noticed it this AM, does not notice it every day. He denies LH or  dizziness. No syncope. He can climb one flight of stairs without chest pain or shortness of breath. No hx of significant smoking.   Past Medical History:  Diagnosis Date   Alopecia    DDD (degenerative disc disease)    Degenerative scoliosis 07/07/2018   Diverticulosis    ED (erectile dysfunction)     GERD (gastroesophageal reflux disease)    Melanoma ([place])    sees Dr. [place] Crane.  back and chest   Neurogenic claudication ([place]) 07/07/2018   Renal stone    Spinal stenosis of lumbar region 07/07/2018    Past Surgical History:  Procedure Laterality Date   I & D EXTREMITY Right 01/08/2020   Procedure: IRRIGATION AND DEBRIDEMENT RIGHT HAND;  Surgeon: Charlotte Crumb, MD;  Location: [place];  Service: Orthopedics;  Laterality: Right;   KNEE ARTHROSCOPY  2008   RIGHT   LUMBAR DISC SURGERY     MELANOMA EXCISION       Home Medications:  Prior to Admission medications   Medication Sig Start Date End Date Taking? Authorizing Provider  Ascorbic Acid (VITAMIN C) 1000 MG tablet Take 1,000 mg by mouth daily.   Yes [provider]  augmented betamethasone dipropionate (DIPROLENE-AF) 0.05 % cream Apply 1 application topically 2 (two) times daily.  11/13/19  Yes [provider]  carisoprodol (SOMA) 350 MG tablet Take 1 tablet (350 mg total) by mouth daily as needed for muscle spasms. 03/14/20  Yes Denita Lung, MD  celecoxib (CELEBREX) 200 MG capsule TAKE 1 CAPSULE BY MOUTH TWICE A DAY Patient taking differently: Take 200 mg by mouth 2 (two) times daily. 11/12/19  Yes Denita Lung, MD  clobetasol cream (TEMOVATE) 0.05 %  03/15/20  Yes [provider]  cyclobenzaprine (FLEXERIL) 10 MG tablet Take 10 mg by mouth 2 (two) times daily as needed. 01/10/20  Yes [provider]  Echinacea 400 MG CAPS Take 1 capsule by mouth daily.    Yes [provider]  ferrous sulfate 325 (65 FE) MG tablet Take 1 tablet (325 mg total) by mouth daily. 01/10/20 05/09/20 Yes Bonnielee Haff, MD  finasteride (PROPECIA) 1 MG tablet Take 0.5 mg by mouth daily.    Yes [provider]  glucosamine-chondroitin 500-400 MG tablet Take 1 tablet by mouth in the morning and at bedtime.    Yes [provider]  Javier Docker Oil 1000 MG CAPS Take 1 capsule by mouth daily.    Yes [provider]  losartan (COZAAR) 50 MG tablet Take 1 tablet (50 mg total) by mouth daily. 04/15/20  Yes Denita Lung, MD  MELATONIN PO Take 10 mg by mouth at bedtime.    Yes [provider]  Multiple Vitamins-Minerals (MULTIVITAMIN WITH MINERALS) tablet Take 1 tablet by mouth daily.   Yes [provider]  nortriptyline (PAMELOR) 50 MG capsule TAKE 1 CAPSULE BY MOUTH EVERYDAY AT BEDTIME Patient taking differently: Take 50 mg by mouth at bedtime. 11/12/19  Yes Jaffe, Adam R, DO  tadalafil (CIALIS) 20 MG tablet TAKE 1 TABLET BY MOUTH EVERY DAY AS NEEDED FOR ERECTILE DYSFUNCTION Patient taking differently: Take 20 mg by mouth daily as needed for erectile dysfunction. 08/27/19  Yes Denita Lung, MD  TURMERIC PO Take 1 tablet by mouth daily.    Yes [provider]  VITAMIN D PO Take 1 tablet by mouth daily.    Yes [provider]  vitamin E 100 UNIT capsule Take 100 Units by mouth daily.  Yes [provider]  Zinc 50 MG TABS Take 1 tablet by mouth daily.    Yes [provider]  zolpidem (AMBIEN) 10 MG tablet TAKE 1 TABLET BY MOUTH EVERY DAY AT BEDTIME AS NEEDED FOR SLEEP 03/07/20  Yes Denita Lung, MD      Allergies:    Allergies  Allergen Reactions   Daypro [Oxaprozin]     GASTRIC PROBLEMS   Methadone Itching    Social History:   Social History   Socioeconomic History   Marital status: Married    Spouse name: sarah Tetrault   Number of children: 1   Years of education: Not on file   Highest education level: Not on file  Occupational History   Not on file  Tobacco Use   Smoking status: Former    Packs/day: 1.00    Types: Cigarettes    Quit date: 04/25/1994    Years since quitting: 26.9   Smokeless tobacco: Never  Substance and Sexual Activity   Alcohol use: Yes    Alcohol/week: 2.0 standard drinks    Types: 2 Cans of beer per week    Comment: 1-2 drink a week   Drug use: No   Sexual activity: Yes  Other Topics  Concern   Not on file  Social History Narrative   riight handed   Drinks caffeine   Split level home   Social Determinants of Health   Financial Resource Strain: Not on file  Food Insecurity: Not on file  Transportation Needs: Not on file  Physical Activity: Not on file  Stress: Not on file  Social Connections: Not on file  Intimate Partner Violence: Not on file    Family History:    Family History  Problem Relation Age of Onset   Transient ischemic attack Father    Multiple myeloma Father    Bladder Cancer Father    Celiac disease Mother    Scoliosis Mother    Colon cancer Neg Hx    Esophageal cancer Neg Hx    Liver disease Neg Hx    Kidney disease Neg Hx    Colon polyps Neg Hx      ROS:  Please see the history of present illness.  All other ROS reviewed and negative.     Physical Exam/Data:   Vitals:   03/29/21 1326  BP: 130/88  Pulse: (!) 103  SpO2: 97%      Last 3 Weights 03/29/2021 10/14/2020 08/30/2020  Weight (lbs) 187 lb 182 lb 182 lb 12.8 oz  Weight (kg) 84.823 kg 82.555 kg 82.918 kg     Physical Exam Gen: well appearing Neuro: alert and oriented CV: r,r,r no murmurs. NO JVD Vasc: 2+ radial pulses Pulm: nl wob, CLAB Abd: non distended Ext: No LE edema Skin: warm and well perfused Psych: normal mood    EKG:  The EKG was personally reviewed and demonstrates: Sinus rhythm with very frequent PACs and suspicion for left atrial abnormality.  There are no repolarization changes.  Normal QT interval.  The ECG from 03/16/2020 shows very similar findings.  There is no atrial fibrillation, but there are frequent PACs.  Ectopic atrial tachycardia rate 103 bpm.   Relevant CV Studies:   Laboratory Data:  High Sensitivity Troponin:  No results for input(s): TROPONINIHS in the last 720 hours.   ChemistryNo results for input(s): NA, K, CL, CO2, GLUCOSE, BUN, CREATININE, CALCIUM, GFRNONAA, GFRAA, ANIONGAP in the last 168 hours.  No results for input(s):  PROT, ALBUMIN,  AST, ALT, ALKPHOS, BILITOT in the last 168 hours. HematologyNo results for input(s): WBC, RBC, HGB, HCT, MCV, MCH, MCHC, RDW, PLT in the last 168 hours. BNPNo results for input(s): BNP, PROBNP in the last 168 hours.  DDimer No results for input(s): DDIMER in the last 168 hours.  06/29/2019 Total cholesterol 161, HDL 52, LDL 96, triglycerides 64  08/27/2019 Hemoglobin A1c 5.8%  01/09/2020 Iron 19, saturation ratio 4%, ferritin 28 Hemoglobin 9.6, otherwise normal CBC  01/13/2020 Creatinine 1.07, potassium 4., ALT 39  08/30/2020 TC 199, HDL 54, LDL 132  Radiology/Studies:  No results found.  The 10-year ASCVD risk score (Arnett DK, et al., 2019) is: 15.6%   Values used to calculate the score:     Age: 64 years     Sex: Male     Is Non-Hispanic African American: No     Diabetic: No     Tobacco smoker: No     Systolic Blood Pressure: 094 mmHg     Is BP treated: Yes     HDL Cholesterol: 54 mg/dL     Total Cholesterol: 199 mg/dL    Assessment and Plan:    #Pre-op: He has can conduct > 4 METS without symptoms. He has no known hx of CAD; no hx of PCI or CABG. His surgical risk for a significant cardiac event is low. Managing his ectopic rhythm with BB per below.   #PACs: Although these are generally a benign abnormality, their frequency is quite striking and there is a family history of atrial fibrillation in several family members.  If atrial fibrillation is identified, Mr. Tawni Millers would have a borderline indication for anticoagulation (hypertension, just turned 64 so CHA2DS2-VASc score is recently 2).  He has iron deficiency anemia that is likely related to GI blood loss, possibly due to the use of NSAIDs or due to GERD.  I would not start him on anticoagulation at this point even if we identify atrial fibrillation.  I think would be more important to see if identify any underlying structural heart disease, evidence of left atrial enlargement on echocardiography, valvular  disease, etc.  We will schedule him for an echocardiogram. - Today he has an ectopic atrial tachycardia will plan for a 7 day ziopatch and start metoprolol 25 mg XL. Discussed that if he has light headedness with standing and low blood pressure, can reduce his losartan  #HLD-  ASCVD 15%. LDL goal < 100. 132.in June. Will discuss statin therapy in follow-up. Can be initiated by PCP. Can start crestor 10 mg.  #HTN: well controlled. Continue losartan 50 mg. Started BB per above  #Iron deficiency anemia: Remote colonoscopy in 2016 did not show pathology that could explain this.  He has GERD and takes NSAIDs frequently so is quite possible he has an upper GI source of bleeding.  May need upper endoscopy.  May also need to be on a higher dose of iron supplement.  I think it would be important to clear this up before we started anticoagulation even if we do identify atrial fibrillation on further work-up.  Follow up in 3 months  Patient Instructions  Medication Instructions:  START: METOPROLOL SUCCINATE 54m ONCE DAILY  *If you need a refill on your cardiac medications before your next appointment, please call your pharmacy*  Testing/Procedures:  [place] Instructions   Your physician has requested you wear your ZIO patch monitor 7 days.   This is a single patch monitor.  [employer] supplies one patch  monitor per enrollment.  Additional stickers are not available.   Please do not apply patch if you will be having a Nuclear Stress Test, Echocardiogram, Cardiac CT, MRI, or Chest Xray during the time frame you would be wearing the monitor. The patch cannot be worn during these tests.  You cannot remove and re-apply the ZIO XT patch monitor.   Your ZIO patch monitor will be sent USPS Priority mail from [facility] directly to your home address. The monitor may also be mailed to a PO BOX if home delivery is not available.   It may take 3-5 days to receive your monitor after you  have been enrolled.   Once you have received you monitor, please review enclosed instructions.  Your monitor has already been registered assigning a specific monitor serial # to you.   Applying the monitor   Shave hair from upper left chest.   Hold abrader disc by orange tab.  Rub abrader in 40 strokes over left upper chest as indicated in your monitor instructions.   Clean area with 4 enclosed alcohol pads .  Use all pads to assure are is cleaned thoroughly.  Let dry.   Apply patch as indicated in monitor instructions.  Patch will be place under collarbone on left side of chest with arrow pointing upward.   Rub patch adhesive wings for 2 minutes.Remove white label marked "1".  Remove white label marked "2".  Rub patch adhesive wings for 2 additional minutes.   While looking in a mirror, press and release button in center of patch.  A small green light will flash 3-4 times .  This will be your only indicator the monitor has been turned on.     Do not shower for the first 24 hours.  You may shower after the first 24 hours.   Press button if you feel a symptom. You will hear a small click.  Record Date, Time and Symptom in the Patient Log Book.   When you are ready to remove patch, follow instructions on last 2 pages of Patient Log Book.  Stick patch monitor onto last page of Patient Log Book.   Place Patient Log Book in [place] box.  Use locking tab on box and tape box closed securely.  The [employer] and [employer] has [employer] on it.  Please place in mailbox as soon as possible.  Your physician should have your test results approximately 7 days after the monitor has been mailed back to [facility].   Call [place] at [phone] if you have questions regarding your ZIO XT patch monitor.  Call them immediately if you see an orange light blinking on your monitor.   If your monitor falls off in less than 4 days contact our Monitor department at [phone].  If your  monitor becomes loose or falls off after 4 days call [employer] at [phone] for suggestions on securing your monitor.    Follow-Up: At [facility], you and your health needs are our priority.  As part of our continuing mission to provide you with exceptional heart care, we have created designated Provider Care Teams.  These Care Teams include your primary Cardiologist (physician) and Advanced Practice Providers (APPs -  Physician Assistants and Nurse Practitioners) who all work together to provide you with the care you need, when you need it.  Your next appointment:   06/27/21 at [facility]  The format for your next appointment:   In Person  Provider: Dr. Harl Bowie  For questions or updates, please contact [place] Please consult [email or web address] for contact info under    Signed, Janina Mayo, MD  03/29/2021 2:13 PM

## 2021-03-29 NOTE — Progress Notes (Unsigned)
Enrolled for [employer] to mail a ZIO XT long term holter monitor to the patients address on file.  

## 2021-03-29 NOTE — Patient Instructions (Signed)
Medication Instructions:  START: METOPROLOL SUCCINATE 25mg  ONCE DAILY  *If you need a refill on your cardiac medications before your next appointment, please call your pharmacy*  Testing/Procedures:  [place] Monitor Instructions   Your physician has requested you wear your ZIO patch monitor 7 days.   This is a single patch monitor.  [employer] supplies one patch monitor per enrollment.  Additional stickers are not available.   Please do not apply patch if you will be having a Nuclear Stress Test, Echocardiogram, Cardiac CT, MRI, or Chest Xray during the time frame you would be wearing the monitor. The patch cannot be worn during these tests.  You cannot remove and re-apply the ZIO XT patch monitor.   Your ZIO patch monitor will be sent USPS Priority mail from [facility] directly to your home address. The monitor may also be mailed to a PO BOX if home delivery is not available.   It may take 3-5 days to receive your monitor after you have been enrolled.   Once you have received you monitor, please review enclosed instructions.  Your monitor has already been registered assigning a specific monitor serial # to you.   Applying the monitor   Shave hair from upper left chest.   Hold abrader disc by orange tab.  Rub abrader in 40 strokes over left upper chest as indicated in your monitor instructions.   Clean area with 4 enclosed alcohol pads .  Use all pads to assure are is cleaned thoroughly.  Let dry.   Apply patch as indicated in monitor instructions.  Patch will be place under collarbone on left side of chest with arrow pointing upward.   Rub patch adhesive wings for 2 minutes.Remove white label marked "1".  Remove white label marked "2".  Rub patch adhesive wings for 2 additional minutes.   While looking in a mirror, press and release button in center of patch.  A small green light will flash 3-4 times .  This will be your only indicator the monitor has been turned on.      Do not shower for the first 24 hours.  You may shower after the first 24 hours.   Press button if you feel a symptom. You will hear a small click.  Record Date, Time and Symptom in the Patient Log Book.   When you are ready to remove patch, follow instructions on last 2 pages of Patient Log Book.  Stick patch monitor onto last page of Patient Log Book.   Place Patient Log Book in [place] box.  Use locking tab on box and tape box closed securely.  The [employer] has [employer] on it.  Please place in mailbox as soon as possible.  Your physician should have your test results approximately 7 days after the monitor has been mailed back to [facility].   Call [place] at [phone] if you have questions regarding your ZIO XT patch monitor.  Call them immediately if you see an orange light blinking on your monitor.   If your monitor falls off in less than 4 days contact our Monitor department at [phone].  If your monitor becomes loose or falls off after 4 days call [employer] at [phone] for suggestions on securing your monitor.    Follow-Up: At [facility], you and your health needs are our priority.  As part of our continuing mission to provide you with exceptional heart care, we have created designated Provider Care  Teams.  These Care Teams include your primary Cardiologist (physician) and Advanced Practice Providers (APPs -  Physician Assistants and Nurse Practitioners) who all work together to provide you with the care you need, when you need it.  Your next appointment:   06/27/21 at [facility]  The format for your next appointment:   In Person  Provider: Dr. Harl Bowie

## 2021-04-02 DIAGNOSIS — I471 Supraventricular tachycardia: Secondary | ICD-10-CM | POA: Diagnosis not present

## 2021-04-07 ENCOUNTER — Other Ambulatory Visit: Payer: Self-pay

## 2021-04-07 ENCOUNTER — Encounter (HOSPITAL_BASED_OUTPATIENT_CLINIC_OR_DEPARTMENT_OTHER): Payer: Self-pay | Admitting: Emergency Medicine

## 2021-04-07 ENCOUNTER — Emergency Department (HOSPITAL_BASED_OUTPATIENT_CLINIC_OR_DEPARTMENT_OTHER): Payer: 59

## 2021-04-07 ENCOUNTER — Telehealth: Payer: Self-pay

## 2021-04-07 ENCOUNTER — Emergency Department (HOSPITAL_BASED_OUTPATIENT_CLINIC_OR_DEPARTMENT_OTHER)
Admission: EM | Admit: 2021-04-07 | Discharge: 2021-04-07 | Disposition: A | Discharge status: Home / Self Care | Payer: 59 | Attending: Emergency Medicine | Admitting: Emergency Medicine

## 2021-04-07 DIAGNOSIS — L03011 Cellulitis of right finger: Secondary | ICD-10-CM

## 2021-04-07 DIAGNOSIS — M79644 Pain in right finger(s): Secondary | ICD-10-CM | POA: Diagnosis present

## 2021-04-07 MED ORDER — SULFAMETHOXAZOLE-TRIMETHOPRIM 800-160 MG PO TABS
1.0000 | ORAL_TABLET | Freq: Two times a day (BID) | ORAL | 0 refills | Status: AC
Start: 2021-04-07 — End: 2021-04-14

## 2021-04-07 NOTE — ED Notes (Signed)
Pt provided discharge instructions and prescription information. Pt was given the opportunity to ask questions and questions were answered. Discharge signature not obtained in the setting of the COVID-19 pandemic in order to reduce high touch surfaces.  ° °

## 2021-04-07 NOTE — ED Triage Notes (Signed)
Pt endorses right ring finger swelling since this morning. Pt had to have hand operated on a few years ago and told by hand specialist to come to ER.

## 2021-04-07 NOTE — ED Provider Notes (Signed)
[facility]ERG[facility]Y DEPT Provider Note   CSN: [id number] Arrival date & time: 04/07/21  1627     History  Chief Complaint  Patient presents with   hand swelling    Harsh Trulock Azar is a 66 y.o. male.  HPI  Patient presents with pain and swelling to the right ring finger x1 day.  Happened acutely this morning, unable to identify any injury to the hand or injury.  States he noticed swelling and erythema, some pain to the DIP joint.  Called his hand surgeon who advised to go to the ED for work-up.  Patient has not taken anything for pain, does report he had a severe cellulitis infection in 2021 that required surgical debridement.  Has not had any complications since then, not having any fevers or chills.  Denies any history of diabetes or immunocompromise state.  Symptoms constant, no alleviating or aggravating factors that he is noticed.  Past Medical History:  Diagnosis Date   Alopecia    DDD (degenerative disc disease)    Degenerative scoliosis 07/07/2018   Diverticulosis    ED (erectile dysfunction)    GERD (gastroesophageal reflux disease)    Melanoma ([place])    sees Dr. [place] Crane.  back and chest   Neurogenic claudication ([place]) 07/07/2018   Renal stone    Spinal stenosis of lumbar region 07/07/2018     Home Medications Prior to Admission medications   Medication Sig Start Date End Date Taking? Authorizing Provider  Ascorbic Acid (VITAMIN C) 1000 MG tablet Take 1,000 mg by mouth daily.    [provider]  augmented betamethasone dipropionate (DIPROLENE-AF) 0.05 % cream Apply 1 application topically 2 (two) times daily. 11/13/19   [provider]  BELSOMRA 15 MG TABS TAKE 15 MG BY MOUTH AT BEDTIME AS NEEDED. 02/09/21   Denita Lung, MD  carisoprodol (SOMA) 350 MG tablet TAKE 1 TABLET (350 MG TOTAL) BY MOUTH DAILY AS NEEDED FOR MUSCLE SPASMS. 10/10/20   Denita Lung, MD  celecoxib (CELEBREX) 200 MG capsule TAKE 1 CAPSULE BY MOUTH TWICE A DAY  01/10/21   Denita Lung, MD  clobetasol cream (TEMOVATE) 0.05 %  03/15/20   [provider]  cyclobenzaprine (FLEXERIL) 10 MG tablet Take 10 mg by mouth 2 (two) times daily as needed. 01/10/20   [provider]  Echinacea 400 MG CAPS Take 1 capsule by mouth daily.     [provider]  ferrous sulfate 325 (65 FE) MG tablet Take 1 tablet (325 mg total) by mouth daily. 01/10/20 03/29/21  Bonnielee Haff, MD  finasteride (PROPECIA) 1 MG tablet Take 0.5 mg by mouth daily.     [provider]  glucosamine-chondroitin 500-400 MG tablet Take 1 tablet by mouth in the morning and at bedtime.     [provider]  Javier Docker Oil 1000 MG CAPS Take 1 capsule by mouth daily.     [provider]  losartan (COZAAR) 50 MG tablet Take 1 tablet (50 mg total) by mouth daily. 04/15/20   Denita Lung, MD  MELATONIN PO Take 10 mg by mouth at bedtime.    [provider]  metoprolol succinate (TOPROL-XL) 25 MG 24 hr tablet Take 1 tablet (25 mg total) by mouth daily. Take with or immediately following a meal. 03/29/21   Janina Mayo, MD  Multiple Vitamins-Minerals (MULTIVITAMIN WITH MINERALS) tablet Take 1 tablet by mouth daily.    [provider]  nortriptyline (PAMELOR) 75 MG capsule TAKE 1  CAPSULE BY MOUTH AT BEDTIME. 11/08/20   Tomi Likens, Adam R, DO  tadalafil (CIALIS) 20 MG tablet TAKE 1 TABLET BY MOUTH EVERY DAY AS NEEDED FOR ERECTILE DYSFUNCTION 08/30/20   Denita Lung, MD  TURMERIC PO Take 1 tablet by mouth daily.    [provider]  VITAMIN D PO Take 1 tablet by mouth daily.     [provider]  vitamin E 100 UNIT capsule Take 100 Units by mouth daily.     [provider]  Zinc 50 MG TABS Take 1 tablet by mouth daily.     [provider]      Allergies    Daypro [oxaprozin] and Methadone    Review of Systems   Review of Systems  Constitutional:  Negative for fever.  Musculoskeletal:  Positive for joint  swelling and myalgias.  Skin:  Positive for color change.   Physical Exam Updated Vital Signs BP 126/78 (BP Location: Left Arm)    Pulse 91    Temp 98.1 F (36.7 C)    Resp 18    Ht 6\' 3"  (1.905 m)    Wt 83.9 kg    SpO2 96%    BMI 23.12 kg/m  Physical Exam Vitals and nursing note reviewed. Exam conducted with a chaperone present.  Constitutional:      General: He is not in acute distress.    Appearance: Normal appearance.  HENT:     Head: Normocephalic and atraumatic.  Eyes:     General: No scleral icterus.    Extraocular Movements: Extraocular movements intact.     Pupils: Pupils are equal, round, and reactive to light.  Cardiovascular:     Rate and Rhythm: Normal rate.  Musculoskeletal:        General: Swelling present. Normal range of motion.     Comments: Good range of motion to the DIP and PIP joint of the right ring finger.  No tenderness to the wrist, full range of motion.  Skin:    General: Skin is warm.     Capillary Refill: Capillary refill takes less than 2 seconds.     Coloration: Skin is not jaundiced.     Findings: Erythema present.     Comments: Erythema and slight swelling to the second right finger diffusely.  Warm to touch  Neurological:     Mental Status: He is alert. Mental status is at baseline.     Coordination: Coordination normal.    ED Results / Procedures / Treatments   Labs (all labs ordered are listed, but only abnormal results are displayed) Labs Reviewed - No data to display  EKG None  Radiology DG Finger Ring Right  Result Date: 04/07/2021 CLINICAL DATA:  Right ring finger swelling EXAM: RIGHT RING FINGER 2+V COMPARISON:  None. FINDINGS: There is no evidence of fracture or dislocation. There is no evidence of arthropathy or other focal bone abnormality. Soft tissues are unremarkable. IMPRESSION: Negative. Electronically Signed   By: Fidela Salisbury M.D.   On: 04/07/2021 17:44    Procedures Procedures    Medications Ordered in  ED Medications - No data to display  ED Course/ Medical Decision Making/ A&P                           Medical Decision Making  This is a 66 year old male presenting due to right second digit swelling, erythema and pain.  His vitals are stable, he is not febrile or  tachycardic.  Does not appear septic.  Neurovascularly intact with good cap refill and strong radial pulse.  No decreased range of motion.  Radiograph ordered in triage is unremarkable, no soft tissue swelling, fraction, osteomyelitis.  I reviewed the imaging independently and agree with radiologist rotation.  Brief chart review performed, I reviewed his previous antibiotic med list as well as previous ED visit in 2021.   Patient is followed by Dr. Burney Gauze with orthopedic surgery.  Was previously on Bactrim outpatient after IV antibiotics and surgical debridement.  Based on my physical exam, patient vitals and the x-ray I do not feel patient needs hospital admission for IV antibiotics.  He is not septic, physical exam is consistent with cellulitis but I think it is reasonable to do close follow-up with hand and start outpatient antibiotic trial.  Discussed with patient who is in agreement.  We discussed strict return precautions, patient verbalized understanding.  He was discharged in stable condition.        Final Clinical Impression(s) / ED Diagnoses Final diagnoses:  None    Rx / DC Orders ED Discharge Orders     None         Sherrill Raring, Hershal Coria 04/07/21 2146    Luna Fuse, MD 04/09/21 2218

## 2021-04-07 NOTE — Discharge Instructions (Addendum)
Take Bactrim twice daily for the next 7 days. Follow-up with Dr. Burney Gauze, see if you can see him on Monday or Tuesday next week. If you develop fever, if the erythema spreads, or the swelling significantly please return to the ED for additional evaluation and possible admission for IV antibiotics.

## 2021-04-07 NOTE — Telephone Encounter (Signed)
° °  Pre-operative Risk Assessment    Patient Name: Joseph [name]  DOB: July 24, 1955 MRN: [id number]      Request for Surgical Clearance    Procedure:   Right total knee arthoplasty  Date of Surgery:  Clearance TBD                                 Surgeon:  Dr. Rod Can Surgeon's Group or Practice Name:  [facility] Phone number:  [phone] Fax number:  [phone] Attn: Marianna Fuss Maze   Type of Clearance Requested:   - Medical    Type of Anesthesia:  Spinal   Additional requests/questions:    SignedJacqulynn Cadet   04/07/2021, 2:18 PM

## 2021-04-07 NOTE — Telephone Encounter (Signed)
° ° °  Patient Name: Joseph [name]  DOB: 1955-06-19 MRN: [id number]  Primary Cardiologist: Sanda Klein, MD  Chart reviewed as part of pre-operative protocol coverage. Given past medical history and time since last visit, based on ACC/AHA guidelines, Martrell Eguia Muckle would be at acceptable risk for the planned procedure without further cardiovascular testing.   Patient was recently seen by Dr. Phineas Inches on 03/29/2021, per office note "He has can conduct > 4 METS without symptoms. He has no known hx of CAD; no hx of PCI or CABG. His surgical risk for a significant cardiac event is low."  The patient was advised that if he develops new symptoms prior to surgery to contact our office to arrange for a follow-up visit, and he verbalized understanding.  I will route this recommendation to the requesting party via Epic fax function and remove from pre-op pool.  Please call with questions.  [place], [place] 04/07/2021, 4:03 PM

## 2021-04-13 ENCOUNTER — Ambulatory Visit: Payer: 59 | Admitting: Orthopedic Surgery

## 2021-04-22 ENCOUNTER — Other Ambulatory Visit: Payer: Self-pay | Admitting: Family Medicine

## 2021-04-22 ENCOUNTER — Other Ambulatory Visit: Payer: Self-pay | Admitting: Neurology

## 2021-04-22 DIAGNOSIS — I10 Essential (primary) hypertension: Secondary | ICD-10-CM

## 2021-04-24 NOTE — Telephone Encounter (Signed)
[facility] is requesting to fill pt soma and celebrex. Please advise [facility]

## 2021-05-12 ENCOUNTER — Ambulatory Visit: Payer: Self-pay | Admitting: Orthopedic Surgery

## 2021-05-23 NOTE — Progress Notes (Signed)
? ?NEUROLOGY FOLLOW UP OFFICE NOTE ? ?Joseph [name] ?[id number] ? ?Assessment/Plan:  ? ?1.  Cervicogenic headache ?2.  Cervical spinal stenosis with degenerative disc disease and spondylosis ?3.  Questionable extrapyramidal symptoms (tardive dyskinesia, twitching) - possibly from nortriptyline. ?  ?1.  Will try decreasing nortriptyline to 108m at bedtime to see if it alleviates the symptoms at night.  The nortriptyline may be contributing to his elevated blood pressure as well.  He will follow up in 6 months but if he wishes to try decreasing dose further before follow up, he can reach out to me. ?  ?Subjective:  ?Joseph [name] a 66year old right-handed man who follows up for cervicogenic headache with cervical spondylosis. ?  ?UPDATE: ?Last year, increased nortriptyline to 75mat bedtime.  No headaches or significant neck pain.  He has been started on losartan for high blood pressure.  Over the past year, he notices that when he is laying in bed at night, sometimes he will squint his eyes and face will grimace.  It is brief.  It doesn't hurt.  On occasion, he may have a twitch in his shoulder.   ? ?  ?  ?HISTORY: ?In August 2016, he began experiencing bilateral posterior neck pain radiating up to the upper cervical region and down to between the shoulder blades.  He describes it as an aching pain.  About 3 days a week, it is accompanied by a headache in a band-like distribution.  He also reports burning sensation at the C7 spinal process.  He reports that a week prior to onset, he performed manual labor in the yard, such as chopping wood and pushing a wheel barrel of dirt.  He has undergone 5 weeks of PT with moderate relief.  The neck pain seems a little worse after a session.  Sometimes he notes numbness in the left arm and hand.  Plain films of cervical spine demonstrated multilevel degenerative disc disease with multiple osseous neural foraminal narrowing, most pronounced at C4-5, C5-6 and C6-7.   Craniocervical junctuion was unremarkable.  MRI of cervical spine from 01/14/15.  It reveals multilevel spondylosis moderate left C3-4 foraminal stenosis, moderate to severe bilateral C5-6 foraminal narrowing and severe left and moderate right C6-7 foraminal stenosis.  There is mild central stenosis with moderate left central stenosis at C6-7. Repeat MRI of cervical spine from 06/12/2018 showed progressed multilevel degenerative changes with neural foraminal stenosis as well as moderate canal stenosis at C3-4.  He was evaluated by spine surgeon who sent him to Dr. BaBrien Fewf pain management.  He received an epidural which was helpful. ?  ?In [place] 2021, he reported very brief disorientation while driving.  At an intersection, it may take a couple of seconds to get his bearings.  It really only occurs at night, not during the day.  Also, he may sometimes have word-finding difficulty or can't recall somebody's name right away. B12 and TSH from March 2021 were 441 and 1.20 respectively.   ? ?PAST MEDICAL HISTORY: ?Past Medical History:  ?Diagnosis Date  ? Alopecia   ? DDD (degenerative disc disease)   ? Degenerative scoliosis 07/07/2018  ? Diverticulosis   ? ED (erectile dysfunction)   ? GERD (gastroesophageal reflux disease)   ? [place]. [place] back [place] chest  ? [place]  ? Renal stone   ? Spinal stenosis of lumbar region 07/07/2018  ? ? ?MEDICATIONS: ?Current Outpatient Medications on File Prior to  Visit  ?Medication Sig Dispense Refill  ? carisoprodol (SOMA) 350 MG tablet TAKE 1 TABLET BY MOUTH DAILY AS NEEDED FOR MUSCLE SPASMS 30 tablet 2  ? celecoxib (CELEBREX) 200 MG capsule TAKE 1 CAPSULE BY MOUTH TWICE A DAY 60 capsule 2  ? Ascorbic Acid (VITAMIN C) 1000 MG tablet Take 1,000 mg by mouth daily.    ? augmented betamethasone dipropionate (DIPROLENE-AF) 0.05 % cream Apply 1 application topically 2 (two) times daily.    ? BELSOMRA 15 MG TABS TAKE 15 MG BY MOUTH AT  BEDTIME AS NEEDED. 30 tablet 1  ? clobetasol cream (TEMOVATE) 0.05 %     ? cyclobenzaprine (FLEXERIL) 10 MG tablet Take 10 mg by mouth 2 (two) times daily as needed.    ? Echinacea 400 MG CAPS Take 1 capsule by mouth daily.     ? ferrous sulfate 325 (65 FE) MG tablet Take 1 tablet (325 mg total) by mouth daily. 30 tablet 3  ? finasteride (PROPECIA) 1 MG tablet Take 0.5 mg by mouth daily.     ? glucosamine-chondroitin 500-400 MG tablet Take 1 tablet by mouth in the morning and at bedtime.     ? Krill Oil 1000 MG CAPS Take 1 capsule by mouth daily.     ? losartan (COZAAR) 50 MG tablet TAKE 1 TABLET BY MOUTH EVERY DAY 90 tablet 1  ? MELATONIN PO Take 10 mg by mouth at bedtime.    ? metoprolol succinate (TOPROL-XL) 25 MG 24 hr tablet Take 1 tablet (25 mg total) by mouth daily. Take with or immediately following a meal. 30 tablet 3  ? Multiple Vitamins-Minerals (MULTIVITAMIN WITH MINERALS) tablet Take 1 tablet by mouth daily.    ? nortriptyline (PAMELOR) 75 MG capsule TAKE 1 CAPSULE BY MOUTH EVERYDAY AT BEDTIME 30 capsule 0  ? tadalafil (CIALIS) 20 MG tablet TAKE 1 TABLET BY MOUTH EVERY DAY AS NEEDED FOR ERECTILE DYSFUNCTION 30 tablet 5  ? TURMERIC PO Take 1 tablet by mouth daily.    ? VITAMIN D PO Take 1 tablet by mouth daily.     ? vitamin E 100 UNIT capsule Take 100 Units by mouth daily.     ? Zinc 50 MG TABS Take 1 tablet by mouth daily.     ? ?No current facility-administered medications on file prior to visit.  ? ? ?ALLERGIES: ?Allergies  ?Allergen Reactions  ? Daypro [Oxaprozin]   ?  GASTRIC PROBLEMS  ? Methadone Itching  ? ? ?FAMILY HISTORY: ?Family History  ?Problem Relation Age of Onset  ? Transient ischemic attack Father   ? Multiple myeloma Father   ? Bladder Cancer Father   ? Celiac disease Mother   ? Scoliosis Mother   ? Colon cancer Neg Hx   ? Esophageal cancer Neg Hx   ? Liver disease Neg Hx   ? Kidney disease Neg Hx   ? Colon polyps Neg Hx   ? ? ?  ?Objective:  ?Blood pressure (!) 145/88, pulse 94, resp.  rate 18, height _0  (1.905 m), weight 192 lb (87.1 kg), SpO2 95 %. ?General: No acute distress.  Patient appears well-groomed.   ?Head:  Normocephalic/atraumatic ?Eyes:  Fundi examined but not visualized ?Neurological Exam: alert and oriented to person, place, and time.  Speech fluent and not dysarthric, language intact.  CN II-XII intact. Bulk and tone normal, muscle strength 5/5 throughout.  Sensation to light touch intact.  Deep tendon reflexes 2+ throughout, toes downgoing.  Finger to nose testing  intact.  Gait normal, Romberg negative. ? ? ?Metta Clines, DO ? ?CC: Jill Alexanders, MD ? ? ? ? ? ? ?

## 2021-05-24 ENCOUNTER — Other Ambulatory Visit: Payer: Self-pay | Admitting: Family Medicine

## 2021-05-24 ENCOUNTER — Ambulatory Visit: Payer: 59 | Admitting: Neurology

## 2021-05-24 ENCOUNTER — Encounter: Payer: Self-pay | Admitting: Neurology

## 2021-05-24 ENCOUNTER — Other Ambulatory Visit: Payer: Self-pay

## 2021-05-24 ENCOUNTER — Other Ambulatory Visit: Payer: Self-pay | Admitting: Neurology

## 2021-05-24 VITALS — BP 145/88 | HR 94 | Resp 18 | Ht 75.0 in | Wt 192.0 lb

## 2021-05-24 DIAGNOSIS — G4486 Cervicogenic headache: Secondary | ICD-10-CM

## 2021-05-24 DIAGNOSIS — M47812 Spondylosis without myelopathy or radiculopathy, cervical region: Secondary | ICD-10-CM

## 2021-05-24 MED ORDER — NORTRIPTYLINE HCL 50 MG PO CAPS: 50.0000 mg | ORAL_CAPSULE | Freq: Every day | ORAL | 5 refills | Status: DC

## 2021-05-24 NOTE — Patient Instructions (Signed)
To see if we can reduce the facial grimacing, will decrease nortriptyline to 50mg  at bedtime.  Follow up 6 months.  If you want to try decreasing dose further before next visit, contact me. ?

## 2021-05-24 NOTE — Telephone Encounter (Signed)
[facility] is [facility] to fill pt belsomra. Please advise [facility] ?

## 2021-05-30 ENCOUNTER — Ambulatory Visit: Payer: Self-pay | Admitting: Orthopedic Surgery

## 2021-05-30 NOTE — Patient Instructions (Addendum)
DUE TO COVID-19 ONLY ONE VISITOR  (aged 67 and older)  IS ALLOWED TO COME WITH YOU AND STAY IN THE WAITING ROOM ONLY DURING PRE OP AND PROCEDURE.   **NO VISITORS ARE ALLOWED IN THE SHORT STAY AREA OR RECOVERY ROOM!!**  IF YOU WILL BE ADMITTED INTO THE HOSPITAL YOU ARE ALLOWED ONLY TWO SUPPORT PEOPLE DURING VISITATION HOURS ONLY (7 AM -8PM)   The support person(s) must pass our screening, gel in and out, and wear a mask at all times, including in the patients room. Patients must also wear a mask when staff or their support person are in the room. Visitors GUEST BADGE MUST BE WORN VISIBLY  One adult visitor may remain with you overnight and MUST be in the room by 8 P.M.      COVID SWAB TESTING MUST BE COMPLETED ON:  06/06/21 at 8:30 AM    (*ARRIVE AT [place] IS NOT HERE BEFORE 8AM!!!*)    Site: [facility] [address] [place] Enter: Main Entrance have a seat in the waiting area to the right of main entrance (DO NOT [place]!!!!!) Dial: [phone] to alert staff you have arrived  You are not required to quarantine, however you are required to wear a well-fitted mask when you are out and around people not in your household.  Hand Hygiene often Do NOT share personal items Notify your provider if you are in close contact with someone who has COVID or you develop fever 100.4 or greater, new onset of sneezing, cough, sore throat, shortness of breath or body aches.          Your procedure is scheduled on: 06/08/21   Report to [facility] Main Entrance    Report to admitting at 7:45 AM   Call this number if you have problems the morning of surgery [phone]   Do not eat food :After Midnight.   After Midnight you may have the following liquids until _7:30_____ AM DAY OF SURGERY  Water Black Coffee (sugar ok, NO MILK/CREAM OR CREAMERS)  Tea (sugar ok, NO MILK/CREAM OR CREAMERS) regular and decaf                              Plain Jell-O (NO RED)                                           Fruit ices (not with fruit pulp, NO RED)                                     Popsicles (NO RED)                                                                  Juice: apple, WHITE grape, WHITE cranberry Sports drinks like Gatorade (NO RED) Clear broth(vegetable,chicken,beef)                    The day of surgery:  Drink ONE (1) Pre-Surgery Clear Ensure  at 7:15 AM the morning of surgery. Drink in one sitting. Do not sip.  This drink was given to you during your hospital  pre-op appointment visit. Nothing else to drink after completing the  Pre-Surgery Clear Ensure  at 7:30 am          If you have questions, please contact your surgeons office.   FOLLOW BOWEL PREP AND ANY ADDITIONAL PRE OP INSTRUCTIONS YOU RECEIVED FROM YOUR SURGEON'S OFFICE!!!     Oral Hygiene is also important to reduce your risk of infection.                                    Remember - BRUSH YOUR TEETH THE MORNING OF SURGERY WITH YOUR REGULAR TOOTHPASTE   Do NOT smoke after Midnight   Take these medicines the morning of surgery with A SIP OF WATER: Metoprolol   Bring CPAP mask and tubing day of surgery.                              You may not have any metal on your body including hair pins, jewelry, and body piercing             Do not wear make-up, lotions, powders, perfumes/cologne, or deodorant                Men may shave face and neck.   Do not bring valuables to the hospital. [place].   Contacts, dentures or bridgework may not be worn into surgery.   Bring small overnight bag day of surgery.    Patients discharged on the day of surgery will not be allowed to drive home.  Someone NEEDS to stay with you for the first 24 hours after anesthesia.   Special Instructions: Bring a copy of your healthcare power of attorney and living will documents the day of surgery if you  haven't scanned them before.              Please read over the following fact sheets you were given: IF YOU HAVE QUESTIONS ABOUT YOUR PRE-OP INSTRUCTIONS PLEASE CALL [phone]     [facility] - Preparing for Surgery Before surgery, you can play an important role.  Because skin is not sterile, your skin needs to be as free of germs as possible.  You can reduce the number of germs on your skin by washing with CHG (chlorahexidine gluconate) soap before surgery.  CHG is an antiseptic cleaner which kills germs and bonds with the skin to continue killing germs even after washing. Please DO NOT use if you have an allergy to CHG or antibacterial soaps.  If your skin becomes reddened/irritated stop using the CHG and inform your nurse when you arrive at Short Stay..  You may shave your face/neck. Please follow these instructions carefully:  1.  Shower with CHG Soap the night before surgery and the  morning of Surgery.  2.  If you choose to wash your hair, wash your hair first as usual with your  normal  shampoo.  3.  After you shampoo, rinse your hair and body thoroughly to remove the  shampoo.  4.  Use CHG as you would any other liquid soap.  You can apply chg directly  to the skin and wash                       Gently with a scrungie or clean washcloth.  5.  Apply the CHG Soap to your body ONLY FROM THE NECK DOWN.   Do not use on face/ open                           Wound or open sores. Avoid contact with eyes, ears mouth and genitals (private parts).                       Wash face,  Genitals (private parts) with your normal soap.             6.  Wash thoroughly, paying special attention to the area where your surgery  will be performed.  7.  Thoroughly rinse your body with warm water from the neck down.  8.  DO NOT shower/wash with your normal soap after using and rinsing off  the CHG Soap.                9.  Pat yourself dry with a clean towel.            10.  Wear clean  pajamas.            11.  Place clean sheets on your bed the night of your first shower and do not  sleep with pets. Day of Surgery : Do not apply any lotions/deodorants the morning of surgery.  Please wear clean clothes to the hospital/surgery center.  FAILURE TO FOLLOW THESE INSTRUCTIONS MAY RESULT IN THE CANCELLATION OF YOUR SURGERY   ________________________________________________________________________   Incentive Spirometer  An incentive spirometer is a tool that can help keep your lungs clear and active. This tool measures how well you are filling your lungs with each breath. Taking long deep breaths may help reverse or decrease the chance of developing breathing (pulmonary) problems (especially infection) following: A long period of time when you are unable to move or be active. BEFORE THE PROCEDURE  If the spirometer includes an indicator to show your best effort, your nurse or respiratory therapist will set it to a desired goal. If possible, sit up straight or lean slightly forward. Try not to slouch. Hold the incentive spirometer in an upright position. INSTRUCTIONS FOR USE  Sit on the edge of your bed if possible, or sit up as far as you can in bed or on a chair. Hold the incentive spirometer in an upright position. Breathe out normally. Place the mouthpiece in your mouth and seal your lips tightly around it. Breathe in slowly and as deeply as possible, raising the piston or the ball toward the top of the column. Hold your breath for 3-5 seconds or for as long as possible. Allow the piston or ball to fall to the bottom of the column. Remove the mouthpiece from your mouth and breathe out normally. Rest for a few seconds and repeat Steps 1 through 7 at least 10 times every 1-2 hours when you are awake. Take your time and take a few normal breaths between deep breaths. The spirometer may include an indicator to show your best effort. Use the indicator as a goal to work toward  during each repetition. After  each set of 10 deep breaths, practice coughing to be sure your lungs are clear. If you have an incision (the cut made at the time of surgery), support your incision when coughing by placing a pillow or rolled up towels firmly against it. Once you are able to get out of bed, walk around indoors and cough well. You may stop using the incentive spirometer when instructed by your caregiver.  RISKS AND COMPLICATIONS Take your time so you do not get dizzy or light-headed. If you are in pain, you may need to take or ask for pain medication before doing incentive spirometry. It is harder to take a deep breath if you are having pain. AFTER USE Rest and breathe slowly and easily. It can be helpful to keep track of a log of your progress. Your caregiver can provide you with a simple table to help with this. If you are using the spirometer at home, follow these instructions: [place] IF:  You are having difficultly using the spirometer. You have trouble using the spirometer as often as instructed. Your pain medication is not giving enough relief while using the spirometer. You develop fever of 100.5 F (38.1 C) or higher. SEEK IMMEDIATE MEDICAL CARE IF:  You cough up bloody sputum that had not been present before. You develop fever of 102 F (38.9 C) or greater. You develop worsening pain at or near the incision site. MAKE SURE YOU:  Understand these instructions. Will watch your condition. Will get help right away if you are not doing well or get worse. Document Released: 07/23/2006 Document Revised: 06/04/2011 Document Reviewed: 09/23/2006 [facility] Patient Information 2014 [place], [place].   ________________________________________________________________________

## 2021-05-30 NOTE — H&P (Signed)
TOTAL KNEE ADMISSION H&P  Patient is being admitted for right total knee arthroplasty.  Subjective:  Chief Complaint:right knee pain.  HPI: Joseph [name] Patient, 66 y.o. male, has a history of pain and functional disability in the right knee due to arthritis and has failed non-surgical conservative treatments for greater than 12 weeks to includeNSAID's and/or analgesics, corticosteriod injections, viscosupplementation injections, flexibility and strengthening excercises, use of assistive devices, weight reduction as appropriate, and activity modification.  Onset of symptoms was gradual, starting >10 years ago with rapidlly worsening course since that time. The patient noted prior procedures on the knee to include  arthroscopy on the right knee(s).  Patient currently rates pain in the right knee(s) at 10 out of 10 with activity. Patient has night pain, worsening of pain with activity and weight bearing, pain that interferes with activities of daily living, pain with passive range of motion, crepitus, and joint swelling.  Patient has evidence of subchondral cysts, subchondral sclerosis, periarticular osteophytes, joint subluxation, and joint space narrowing by imaging studies. There is no active infection.  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/2019   Degenerative scoliosis 07/07/2018   Spinal stenosis of lumbar region 07/07/2018   Neurogenic claudication (HCC) 07/07/2018   Former smoker 09/02/2017   Cervical disc disease 01/20/2015   PAC (premature atrial contraction) 01/17/2015   Varicose veins of both lower extremities 01/17/2015   History of melanoma 01/17/2015   Glucose intolerance (impaired glucose tolerance) 05/24/2014   Chronic back pain 04/26/2011   History of kidney stones 04/26/2011    ED (erectile dysfunction) 04/26/2011   GERD (gastroesophageal reflux disease) 04/26/2011   Past Medical History:  Diagnosis Date   Alopecia    DDD (degenerative disc disease)    Degenerative scoliosis 07/07/2018   Diverticulosis    ED (erectile dysfunction)    GERD (gastroesophageal reflux disease)    Melanoma (HCC)    sees Dr. Margo Aye.  back and chest   Neurogenic claudication (HCC) 07/07/2018   Renal stone    Spinal stenosis of lumbar region 07/07/2018    Past Surgical History:  Procedure Laterality Date   I & D EXTREMITY Right 01/08/2020   Procedure: IRRIGATION AND DEBRIDEMENT RIGHT HAND;  Surgeon: Dairl Ponder, MD;  Location: [facility] OR;  Service: Orthopedics;  Laterality: Right;   KNEE ARTHROSCOPY  2008   RIGHT   LUMBAR DISC SURGERY     MELANOMA EXCISION      Current Outpatient Medications  Medication Sig Dispense Refill Last Dose   Ascorbic Acid (VITAMIN C) 1000 MG tablet Take 1,000 mg by mouth daily.      BELSOMRA 15 MG TABS TAKE 1 TABLET BY MOUTH AT BEDTIME AS NEEDED. 30 tablet 1    Camphor-Menthol-Methyl Sal (SALONPAS) 3.03-31-08 % PTCH Apply 1 patch topically daily as needed (pain).      carisoprodol (SOMA) 350 MG tablet TAKE 1 TABLET BY MOUTH DAILY AS NEEDED FOR MUSCLE SPASMS (Patient taking differently: Take 350 mg by mouth daily.) 30 tablet 2    celecoxib (CELEBREX) 200 MG capsule TAKE 1 CAPSULE BY MOUTH TWICE A DAY 60 capsule 2    cyclobenzaprine (FLEXERIL) 10 MG tablet Take 10 mg by mouth 2 (two) times daily as needed for muscle spasms.      docusate sodium (COLACE) 100 MG capsule Take  100 mg by mouth daily.      Echinacea 400 MG CAPS Take 400 mg by mouth daily.      finasteride (PROPECIA) 1 MG tablet Take 0.25 mg by mouth at bedtime.      Krill Oil 1000 MG CAPS Take 1,000 mg by mouth daily.      losartan (COZAAR) 50 MG tablet TAKE 1 TABLET BY MOUTH EVERY DAY 90 tablet 1    MELATONIN PO Take 10 mg by mouth at bedtime as needed (sleep).      metoprolol succinate  (TOPROL-XL) 25 MG 24 hr tablet Take 1 tablet (25 mg total) by mouth daily. Take with or immediately following a meal. 30 tablet 3    Misc Natural Products (GLUCOSAMINE CHOND CMP TRIPLE) TABS Take 1 tablet by mouth in the morning and at bedtime.      Multiple Vitamins-Minerals (MULTIVITAMIN WITH MINERALS) tablet Take 1 tablet by mouth daily.      nortriptyline (PAMELOR) 50 MG capsule Take 1 capsule (50 mg total) by mouth at bedtime. 30 capsule 5    Saw Palmetto 450 MG CAPS Take 450 mg by mouth daily.      tadalafil (CIALIS) 20 MG tablet TAKE 1 TABLET BY MOUTH EVERY DAY AS NEEDED FOR ERECTILE DYSFUNCTION 30 tablet 5    Turmeric 500 MG CAPS Take 500 mg by mouth daily.      VITAMIN D PO Take 5,000 Units by mouth daily.      vitamin E 180 MG (400 UNITS) capsule Take 400 Units by mouth daily.      Zinc 50 MG TABS Take 50 mg by mouth daily.      No current facility-administered medications for this visit.   Allergies  Allergen Reactions   Daypro [Oxaprozin]     GASTRIC PROBLEMS   Methadone Itching    Social History   Tobacco Use   Smoking status: Former    Packs/day: 1.00    Types: Cigarettes    Quit date: 04/25/1994    Years since quitting: 27.1   Smokeless tobacco: Never  Substance Use Topics   Alcohol use: Yes    Alcohol/week: 2.0 standard drinks    Types: 2 Cans of beer per week    Comment: 1-2 drink a week    Family History  Problem Relation Age of Onset   Transient ischemic attack Father    Multiple myeloma Father    Bladder Cancer Father    Celiac disease Mother    Scoliosis Mother    Colon cancer Neg Hx    Esophageal cancer Neg Hx    Liver disease Neg Hx    Kidney disease Neg Hx    Colon polyps Neg Hx      Review of Systems  Musculoskeletal:  Positive for arthralgias, gait problem and joint swelling.  All other systems reviewed and are negative.  Objective:  Physical Exam Constitutional:      Appearance: Normal appearance.  HENT:     Head: Normocephalic and  atraumatic.     Right Ear: External ear normal.     Left Ear: External ear normal.     Nose: Nose normal.     Mouth/Throat:     Mouth: Mucous membranes are moist.     Pharynx: Oropharynx is clear.  Eyes:     Extraocular Movements: Extraocular movements intact.     Conjunctiva/sclera: Conjunctivae normal.     Pupils: Pupils are equal, round, and reactive to light.  Cardiovascular:  Rate and Rhythm: Normal rate and regular rhythm.     Pulses: Normal pulses.  Pulmonary:     Effort: Pulmonary effort is normal. No respiratory distress.  Abdominal:     General: Abdomen is flat. There is no distension.     Palpations: Abdomen is soft.  Genitourinary:    Comments: deferred Musculoskeletal:     Cervical back: Normal range of motion and neck supple.     Right knee: Swelling, deformity, effusion and bony tenderness present. Decreased range of motion. Abnormal alignment.     Instability Tests: Anterior drawer test positive.  Skin:    General: Skin is warm and dry.     Capillary Refill: Capillary refill takes less than 2 seconds.  Neurological:     Mental Status: He is alert and oriented to person, place, and time.  Psychiatric:        Mood and Affect: Mood normal.        Behavior: Behavior normal.        Thought Content: Thought content normal.        Judgment: Judgment normal.    Vital signs in last 24 hours: @VSRANGES @  Labs:   Estimated body mass index is 24 kg/m as calculated from the following:   Height as of 05/24/21: 6\' 3"  (1.905 m).   Weight as of 05/24/21: 87.1 kg.   Imaging Review Plain radiographs demonstrate severe degenerative joint disease of the right knee(s). The overall alignment issignificant varus. The bone quality appears to be adequate for age and reported activity level.      Assessment/Plan:  End stage arthritis, right knee   The patient history, physical examination, clinical judgment of the provider and imaging studies are consistent with end  stage degenerative joint disease of the right knee(s) and total knee arthroplasty is deemed medically necessary. The treatment options including medical management, injection therapy arthroscopy and arthroplasty were discussed at length. The risks and benefits of total knee arthroplasty were presented and reviewed. The risks due to aseptic loosening, infection, stiffness, patella tracking problems, thromboembolic complications and other imponderables were discussed. The patient acknowledged the explanation, agreed to proceed with the plan and consent was signed. Patient is being admitted for inpatient treatment for surgery, pain control, PT, OT, prophylactic antibiotics, VTE prophylaxis, progressive ambulation and ADL's and discharge planning. The patient is planning to be discharged  home w OPPT.  O/n obs for cardiac arrhythmia.      Patient's anticipated LOS is less than 2 midnights, meeting these requirements: - Younger than 15 - Lives within 1 hour of care - Has a competent adult at home to recover with post-op recover - NO history of  - Chronic pain requiring opiods  - Diabetes  - Coronary Artery Disease  - Heart failure  - Heart attack  - Stroke  - DVT/VTE  - Respiratory Failure/COPD  - Renal failure  - Anemia  - Advanced Liver disease

## 2021-05-30 NOTE — H&P (View-Only) (Signed)
TOTAL KNEE ADMISSION H&P ? ?Patient is being admitted for right total knee arthroplasty. ? ?Subjective: ? ?Chief Complaint:right knee pain. ? ?HPI: Joseph [name], 66 y.o. male, has a history of pain and functional disability in the right knee due to arthritis and has failed non-surgical conservative treatments for greater than 12 weeks to includeNSAID's and/or analgesics, corticosteriod injections, viscosupplementation injections, flexibility and strengthening excercises, use of assistive devices, weight reduction as appropriate, and activity modification.  Onset of symptoms was gradual, starting >10 years ago with rapidlly worsening course since that time. The patient noted prior procedures on the knee to include  arthroscopy on the right knee(s).  Patient currently rates pain in the right knee(s) at 10 out of 10 with activity. Patient has night pain, worsening of pain with activity and weight bearing, pain that interferes with activities of daily living, pain with passive range of motion, crepitus, and joint swelling.  Patient has evidence of subchondral cysts, subchondral sclerosis, periarticular osteophytes, joint subluxation, and joint space narrowing by imaging studies. There is no active infection. ? ?Patient Active Problem List  ? Diagnosis Date Noted  ? Hypertension 08/30/2020  ? Iron deficiency anemia 08/30/2020  ? Other insomnia 08/30/2020  ? Cervicogenic headache 08/27/2019  ? Senile purpura ([place]) 08/27/2019  ? 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 Degenerative scoliosis 07/07/2018  ? Spinal stenosis of lumbar region 07/07/2018  ? Neurogenic claudication ([place]) 07/07/2018  ? Former smoker 09/02/2017  ? Cervical disc disease 01/20/2015  ? PAC (premature atrial contraction) 01/17/2015  ? Varicose veins of both lower extremities 01/17/2015  ? History of melanoma 01/17/2015  ? Glucose intolerance (impaired glucose tolerance) 05/24/2014  ? Chronic back pain 04/26/2011  ? History of kidney stones 04/26/2011  ?  ED (erectile dysfunction) 04/26/2011  ? GERD (gastroesophageal reflux disease) 04/26/2011  ? ?Past Medical History:  ?Diagnosis Date  ? Alopecia   ? DDD (degenerative disc disease)   ? Degenerative scoliosis 07/07/2018  ? Diverticulosis   ? ED (erectile dysfunction)   ? GERD (gastroesophageal reflux disease)   ? Melanoma ([place])   ? sees Dr. Nevada Crane.  back and chest  ? Neurogenic claudication ([place]) 07/07/2018  ? Renal stone   ? Spinal stenosis of lumbar region 07/07/2018  ?  ?Past Surgical History:  ?Procedure Laterality Date  ? I & D EXTREMITY Right 01/08/2020  ? Procedure: IRRIGATION AND DEBRIDEMENT RIGHT HAND;  Surgeon: Charlotte Crumb, MD;  Location: [place];  Service: Orthopedics;  Laterality: Right;  ? KNEE ARTHROSCOPY  2008  ? RIGHT  ? LUMBAR DISC SURGERY    ? MELANOMA EXCISION    ?  ?Current Outpatient Medications  ?Medication Sig Dispense Refill Last Dose  ? Ascorbic Acid (VITAMIN C) 1000 MG tablet Take 1,000 mg by mouth daily.     ? BELSOMRA 15 MG TABS TAKE 1 TABLET BY MOUTH AT BEDTIME AS NEEDED. 30 tablet 1   ? Camphor-Menthol-Methyl Sal (SALONPAS) 3.03-31-08 % PTCH Apply 1 patch topically daily as needed (pain).     ? carisoprodol (SOMA) 350 MG tablet TAKE 1 TABLET BY MOUTH DAILY AS NEEDED FOR MUSCLE SPASMS (Patient taking differently: Take 350 mg by mouth daily.) 30 tablet 2   ? celecoxib (CELEBREX) 200 MG capsule TAKE 1 CAPSULE BY MOUTH TWICE A DAY 60 capsule 2   ? cyclobenzaprine (FLEXERIL) 10 MG tablet Take 10 mg by mouth 2 (two) times daily as needed for muscle spasms.     ? docusate sodium (COLACE) 100 MG capsule Take  100 mg by mouth daily.     ? Echinacea 400 MG CAPS Take 400 mg by mouth daily.     ? finasteride (PROPECIA) 1 MG tablet Take 0.25 mg by mouth at bedtime.     ? Krill Oil 1000 MG CAPS Take 1,000 mg by mouth daily.     ? losartan (COZAAR) 50 MG tablet TAKE 1 TABLET BY MOUTH EVERY DAY 90 tablet 1   ? MELATONIN PO Take 10 mg by mouth at bedtime as needed (sleep).     ? metoprolol succinate  (TOPROL-XL) 25 MG 24 hr tablet Take 1 tablet (25 mg total) by mouth daily. Take with or immediately following a meal. 30 tablet 3   ? Misc Natural Products (GLUCOSAMINE CHOND CMP TRIPLE) TABS Take 1 tablet by mouth in the morning and at bedtime.     ? Multiple Vitamins-Minerals (MULTIVITAMIN WITH MINERALS) tablet Take 1 tablet by mouth daily.     ? nortriptyline (PAMELOR) 50 MG capsule Take 1 capsule (50 mg total) by mouth at bedtime. 30 capsule 5   ? Saw Palmetto 450 MG CAPS Take 450 mg by mouth daily.     ? tadalafil (CIALIS) 20 MG tablet TAKE 1 TABLET BY MOUTH EVERY DAY AS NEEDED FOR ERECTILE DYSFUNCTION 30 tablet 5   ? Turmeric 500 MG CAPS Take 500 mg by mouth daily.     ? VITAMIN D PO Take 5,000 Units by mouth daily.     ? vitamin E 180 MG (400 UNITS) capsule Take 400 Units by mouth daily.     ? Zinc 50 MG TABS Take 50 mg by mouth daily.     ? ?No current facility-administered medications for this visit.  ? ?Allergies  ?Allergen Reactions  ? Daypro [Oxaprozin]   ?  GASTRIC PROBLEMS  ? Methadone Itching  ?  ?Social History  ? ?Tobacco Use  ? Smoking status: Former  ?  Packs/day: 1.00  ?  Types: Cigarettes  ?  Quit date: 04/25/1994  ?  Years since quitting: 27.1  ? Smokeless tobacco: Never  ?Substance Use Topics  ? Alcohol use: Yes  ?  Alcohol/week: 2.0 standard drinks  ?  Types: 2 Cans of beer per week  ?  Comment: 1-2 drink a week  ?  ?Family History  ?Problem Relation Age of Onset  ? Transient ischemic attack Father   ? Multiple myeloma Father   ? Bladder Cancer Father   ? Celiac disease Mother   ? Scoliosis Mother   ? Colon cancer Neg Hx   ? Esophageal cancer Neg Hx   ? Liver disease Neg Hx   ? Kidney disease Neg Hx   ? Colon polyps Neg Hx   ?  ? ?Review of Systems  ?Musculoskeletal:  Positive for arthralgias, gait problem and joint swelling.  ?All other systems reviewed and are negative. ? ?Objective: ? ?Physical Exam ?Constitutional:   ?   Appearance: Normal appearance.  ?HENT:  ?   Head: Normocephalic and  atraumatic.  ?   Right Ear: External ear normal.  ?   Left Ear: External ear normal.  ?   Nose: Nose normal.  ?   Mouth/Throat:  ?   Mouth: Mucous membranes are moist.  ?   Pharynx: Oropharynx is clear.  ?Eyes:  ?   Extraocular Movements: Extraocular movements intact.  ?   Conjunctiva/sclera: Conjunctivae normal.  ?   Pupils: Pupils are equal, round, and reactive to light.  ?Cardiovascular:  ?  Rate and Rhythm: Normal rate and regular rhythm.  ?   Pulses: Normal pulses.  ?Pulmonary:  ?   Effort: Pulmonary effort is normal. No respiratory distress.  ?Abdominal:  ?   General: Abdomen is flat. There is no distension.  ?   Palpations: Abdomen is soft.  ?Genitourinary: ?   Comments: deferred ?Musculoskeletal:  ?   Cervical back: Normal range of motion and neck supple.  ?   Right knee: Swelling, deformity, effusion and bony tenderness present. Decreased range of motion. Abnormal alignment.  ?   Instability Tests: Anterior drawer test positive.  ?Skin: ?   General: Skin is warm and dry.  ?   Capillary Refill: Capillary refill takes less than 2 seconds.  ?Neurological:  ?   Mental Status: He is alert and oriented to person, place, and time.  ?Psychiatric:     ?   Mood and Affect: Mood normal.     ?   Behavior: Behavior normal.     ?   Thought Content: Thought content normal.     ?   Judgment: Judgment normal.  ? ? ?Vital signs in last 24 hours: ?_0 @ ? ?Labs: ? ? ?Estimated body mass index is 24 kg/m? as calculated from the following: ?  Height as of 05/24/21: _1  (1.905 m). ?  Weight as of 05/24/21: 87.1 kg. ? ? ?Imaging Review ?Plain radiographs demonstrate severe degenerative joint disease of the right knee(s). The overall alignment issignificant varus. The bone quality appears to be adequate for age and reported activity level. ? ? ? ? ? ?Assessment/Plan: ? ?End stage arthritis, right knee  ? ?The patient history, physical examination, clinical judgment of the provider and imaging studies are consistent with end  stage degenerative joint disease of the right knee(s) and total knee arthroplasty is deemed medically necessary. The treatment options including medical management, injection therapy arthroscopy and arthroplast

## 2021-05-31 ENCOUNTER — Encounter (HOSPITAL_COMMUNITY)
Admission: RE | Admit: 2021-05-31 | Discharge: 2021-05-31 | Disposition: A | Discharge status: Home / Self Care | Payer: 59 | Source: Ambulatory Visit | Attending: Orthopedic Surgery | Admitting: Orthopedic Surgery

## 2021-05-31 ENCOUNTER — Encounter (HOSPITAL_COMMUNITY): Payer: Self-pay

## 2021-05-31 ENCOUNTER — Other Ambulatory Visit: Payer: Self-pay

## 2021-05-31 VITALS — BP 161/91 | HR 85 | Temp 98.9°F | Resp 16 | Ht 75.0 in | Wt 187.0 lb

## 2021-05-31 DIAGNOSIS — Z01812 Encounter for preprocedural laboratory examination: Secondary | ICD-10-CM | POA: Insufficient documentation

## 2021-05-31 DIAGNOSIS — Z01818 Encounter for other preprocedural examination: Secondary | ICD-10-CM

## 2021-05-31 HISTORY — DX: Personal history of urinary calculi: Z87.442

## 2021-05-31 LAB — CBC
HCT: 37 % — ABNORMAL LOW (ref 39.0–52.0)
Hemoglobin: 12.5 g/dL — ABNORMAL LOW (ref 13.0–17.0)
MCH: 31.7 pg (ref 26.0–34.0)
MCHC: 33.8 g/dL (ref 30.0–36.0)
MCV: 93.9 fL (ref 80.0–100.0)
Platelets: 346 10*3/uL (ref 150–400)
RBC: 3.94 MIL/uL — ABNORMAL LOW (ref 4.22–5.81)
RDW: 12.9 % (ref 11.5–15.5)
WBC: 5.8 10*3/uL (ref 4.0–10.5)
nRBC: 0 % (ref 0.0–0.2)

## 2021-05-31 LAB — BASIC METABOLIC PANEL
Anion gap: 8 (ref 5–15)
BUN: 16 mg/dL (ref 8–23)
CO2: 26 mmol/L (ref 22–32)
Calcium: 9.1 mg/dL (ref 8.9–10.3)
Chloride: 99 mmol/L (ref 98–111)
Creatinine, Ser: 1.09 mg/dL (ref 0.61–1.24)
GFR, Estimated: >60 mL/min (ref >60)
Glucose, Bld: 93 mg/dL (ref 70–99)
Potassium: 4.1 mmol/L (ref 3.5–5.1)
Sodium: 133 mmol/L — ABNORMAL LOW (ref 135–145)

## 2021-05-31 LAB — SURGICAL PCR SCREEN
MRSA, PCR: NEGATIVE
Staphylococcus aureus: POSITIVE — AB

## 2021-05-31 NOTE — Progress Notes (Addendum)
COVID test-06/06/21 at 8:30 am ? ? ?Bowel prep reminder:NA ? ?PCP - Dr. Glo Herring ?Cardiologist - Dr. Beckie Busing ? ?Chest x-ray - no ?EKG - 03/29/21-epic ?Stress Test - no ?ECHO - 05/25/20-epic ?Cardiac Cath - no ?Pacemaker/ICD device last checked:NA ? ?Sleep Study - Stop band score 5. Pt will talk to his PCP after the surgery ?CPAP - no ? ?Fasting Blood Sugar - NA ?Checks Blood Sugar _____ times a day ? ?Blood Thinner Instructions:NA ?Aspirin Instructions: ?Last Dose: ? ?Anesthesia review: yes ? ?Patient denies shortness of breath, fever, cough and chest pain at PAT appointment ?Pt has no SOB with activities. He was sent to Dr. Harl Bowie because of an irregular HR. It was NSR with frequent PACs.  ? ?Patient verbalized understanding of instructions that were given to them at the PAT appointment. Patient was also instructed that they will need to review over the PAT instructions again at home before surgery. yes ?

## 2021-06-02 NOTE — Progress Notes (Signed)
Anesthesia Chart Review ? ? Case: [id number] Date/Time: 06/08/21 1015  ? Procedure: COMPUTER ASSISTED TOTAL KNEE ARTHROPLASTY (Right: Knee)  ? Anesthesia type: Spinal  ? Pre-op diagnosis: Right knee osteoarthritis  ? Location: [facility] ROOM 08 / [facility]  ? Surgeons: Joseph Can, MD  ? ?  ? ? ?DISCUSSION:66 y.o. former smoker with h/o GERD, right knee OA scheduled for above procedure 06/08/2021 with Joseph [name].  ? ?Per cardiology preoperative evaluation 04/07/2021, "Chart reviewed as part of pre-operative protocol coverage. Given past medical history and time since last visit, based on ACC/AHA guidelines, Joseph [name] would be at acceptable risk for the planned procedure without further cardiovascular testing.  ?  ?Patient was recently seen by Joseph [name] on 03/29/2021, per office note "He has [name] conduct > 4 METS without symptoms. He has no known hx of CAD; no hx of PCI or CABG. His surgical risk for a significant cardiac event is low." ?  ?The patient was advised that if he develops new symptoms prior to surgery to contact our office to arrange for a follow-up visit, and he verbalized understanding." ? ?Anticipate pt [name] proceed with planned procedure barring acute status change.   ?VS: BP (!) 161/91   Pulse 85   Temp 37.2 ?C   Resp 16   Ht '6\' 3"'$  (1.905 m)   Wt 84.8 kg   SpO2 99%   BMI 23.37 kg/m?  ? ?PROVIDERS: ?Joseph Lung, MD is PCP  ? ?Primary Cardiologist: Joseph Klein, MD ?LABS: Labs reviewed: Acceptable for surgery. ?(all labs ordered are listed, but only abnormal results are displayed) ? ?Labs Reviewed  ?SURGICAL PCR SCREEN - Abnormal; Notable for the following components:  ?    Result Value  ? Staphylococcus aureus POSITIVE (*)   ? All other components within normal limits  ?BASIC METABOLIC PANEL - Abnormal; Notable for the following components:  ? Sodium 133 (*)   ? All other components within normal limits  ?CBC - Abnormal; Notable for the following components:  ? RBC 3.94 (*)   ?  Hemoglobin 12.5 (*)   ? HCT 37.0 (*)   ? All other components within normal limits  ? ? ? ?IMAGES: ? ? ?EKG: ?03/29/2021 ?Rate 103 bpm  ?Sinus tachycardia ? ?CV: ?Echo 05/25/20 ? 1. Left ventricular ejection fraction, by estimation, is 60 to 65%. The  ?left ventricle has normal function. The left ventricle has no regional  ?wall motion abnormalities. There is moderate asymmetric left ventricular  ?hypertrophy of the basal-septal  ?segment. Left ventricular diastolic parameters were normal.  ? 2. Right ventricular systolic function is normal. The right ventricular  ?size is normal. Tricuspid regurgitation signal is inadequate for assessing  ?PA pressure.  ? 3. The mitral valve is normal in structure. Trivial mitral valve  ?regurgitation.  ? 4. The aortic valve is tricuspid. Aortic valve regurgitation is mild.  ?Mild to moderate aortic valve sclerosis/calcification is present, without  ?any evidence of aortic stenosis.  ? 5. Aortic dilatation noted. There is mild dilatation of the ascending  ?aorta, measuring 40 mm.  ? 6. The inferior vena cava is normal in size with greater than 50%  ?respiratory variability, suggesting right atrial pressure of 3 mmHg. ?Past Medical History:  ?Diagnosis Date  ? Alopecia   ? DDD (degenerative disc disease)   ? Degenerative scoliosis 07/07/2018  ? Diverticulosis   ? ED (erectile dysfunction)   ? GERD (gastroesophageal reflux disease)   ? History of kidney stones   ?  Melanoma ([place])   ? sees Dr. Nevada [name].  back and chest  ? Neurogenic claudication ([place]) 07/07/2018  ? Spinal stenosis of lumbar region 07/07/2018  ? ? ?Past Surgical History:  ?Procedure Laterality Date  ? I & D EXTREMITY Right 01/08/2020  ? Procedure: IRRIGATION AND DEBRIDEMENT RIGHT HAND;  Surgeon: Joseph Crumb, MD;  Location: [place];  Service: Orthopedics;  Laterality: Right;  ? KNEE ARTHROSCOPY  03/26/2006  ? RIGHT  ? [place] SURGERY  2000  ? MELANOMA EXCISION    ? ? ?MEDICATIONS: ? Ascorbic Acid (VITAMIN C) 1000 MG  tablet  ? BELSOMRA 15 MG TABS  ? Camphor-Menthol-Methyl Sal (SALONPAS) 3.03-31-08 % PTCH  ? carisoprodol (SOMA) 350 MG tablet  ? celecoxib (CELEBREX) 200 MG capsule  ? cyclobenzaprine (FLEXERIL) 10 MG tablet  ? docusate sodium (COLACE) 100 MG capsule  ? Echinacea 400 MG CAPS  ? finasteride (PROPECIA) 1 MG tablet  ? Krill Oil 1000 MG CAPS  ? losartan (COZAAR) 50 MG tablet  ? MELATONIN PO  ? metoprolol succinate (TOPROL-XL) 25 MG 24 hr tablet  ? Misc Natural Products (GLUCOSAMINE CHOND CMP TRIPLE) TABS  ? Multiple Vitamins-Minerals (MULTIVITAMIN WITH MINERALS) tablet  ? nortriptyline (PAMELOR) 50 MG capsule  ? Saw Palmetto 450 MG CAPS  ? tadalafil (CIALIS) 20 MG tablet  ? Turmeric 500 MG CAPS  ? VITAMIN D PO  ? vitamin E 180 MG (400 UNITS) capsule  ? Zinc 50 MG TABS  ? ?No current facility-administered medications for this encounter.  ? ? ? ?Joseph Felix Ward, PA-C ?[facility] ?[phone] ? ? ? ? ? ?

## 2021-06-06 ENCOUNTER — Encounter (HOSPITAL_COMMUNITY)
Admission: RE | Admit: 2021-06-06 | Discharge: 2021-06-06 | Disposition: A | Discharge status: Home / Self Care | Payer: 59 | Source: Ambulatory Visit | Attending: Orthopedic Surgery | Admitting: Orthopedic Surgery

## 2021-06-06 ENCOUNTER — Encounter (HOSPITAL_COMMUNITY): Payer: 59

## 2021-06-06 ENCOUNTER — Other Ambulatory Visit: Payer: Self-pay

## 2021-06-06 DIAGNOSIS — Z01818 Encounter for other preprocedural examination: Secondary | ICD-10-CM

## 2021-06-06 DIAGNOSIS — Z20822 Contact with and (suspected) exposure to covid-19: Secondary | ICD-10-CM | POA: Diagnosis not present

## 2021-06-06 DIAGNOSIS — Z01812 Encounter for preprocedural laboratory examination: Secondary | ICD-10-CM | POA: Diagnosis not present

## 2021-06-06 LAB — SARS CORONAVIRUS 2 (TAT 6-24 HRS): SARS Coronavirus 2: NEGATIVE

## 2021-06-08 ENCOUNTER — Ambulatory Visit (HOSPITAL_COMMUNITY)
Admission: RE | Admit: 2021-06-08 | Discharge: 2021-06-09 | Disposition: A | Discharge status: Home / Self Care | Payer: 59 | Source: Ambulatory Visit | Attending: Orthopedic Surgery | Admitting: Orthopedic Surgery

## 2021-06-08 ENCOUNTER — Other Ambulatory Visit: Payer: Self-pay

## 2021-06-08 ENCOUNTER — Encounter (HOSPITAL_COMMUNITY)
Admission: RE | Disposition: A | Discharge status: Home / Self Care | Payer: Self-pay | Source: Ambulatory Visit | Attending: Orthopedic Surgery

## 2021-06-08 ENCOUNTER — Ambulatory Visit (HOSPITAL_COMMUNITY): Payer: 59

## 2021-06-08 ENCOUNTER — Encounter (HOSPITAL_COMMUNITY): Payer: Self-pay | Admitting: Orthopedic Surgery

## 2021-06-08 ENCOUNTER — Ambulatory Visit (HOSPITAL_COMMUNITY): Payer: 59 | Admitting: Physician Assistant

## 2021-06-08 ENCOUNTER — Ambulatory Visit (HOSPITAL_BASED_OUTPATIENT_CLINIC_OR_DEPARTMENT_OTHER): Payer: 59 | Admitting: Anesthesiology

## 2021-06-08 DIAGNOSIS — M1711 Unilateral primary osteoarthritis, right knee: Secondary | ICD-10-CM | POA: Diagnosis present

## 2021-06-08 DIAGNOSIS — Z87891 Personal history of nicotine dependence: Secondary | ICD-10-CM | POA: Diagnosis not present

## 2021-06-08 DIAGNOSIS — I739 Peripheral vascular disease, unspecified: Secondary | ICD-10-CM | POA: Diagnosis not present

## 2021-06-08 DIAGNOSIS — D509 Iron deficiency anemia, unspecified: Secondary | ICD-10-CM | POA: Diagnosis not present

## 2021-06-08 DIAGNOSIS — M25461 Effusion, right knee: Secondary | ICD-10-CM | POA: Diagnosis not present

## 2021-06-08 DIAGNOSIS — D649 Anemia, unspecified: Secondary | ICD-10-CM | POA: Diagnosis not present

## 2021-06-08 DIAGNOSIS — R269 Unspecified abnormalities of gait and mobility: Secondary | ICD-10-CM | POA: Insufficient documentation

## 2021-06-08 DIAGNOSIS — I10 Essential (primary) hypertension: Secondary | ICD-10-CM | POA: Insufficient documentation

## 2021-06-08 HISTORY — PX: KNEE ARTHROPLASTY: SHX992

## 2021-06-08 SURGERY — ARTHROPLASTY, KNEE, TOTAL, USING IMAGELESS COMPUTER-ASSISTED NAVIGATION
Anesthesia: Spinal | Site: Knee | Laterality: Right

## 2021-06-08 MED ORDER — MEPERIDINE HCL 50 MG/ML IJ SOLN: 6.2500 mg | INTRAMUSCULAR | Status: DC | PRN

## 2021-06-08 MED ORDER — METOCLOPRAMIDE HCL 5 MG/ML IJ SOLN: 5.0000 mg | Freq: Three times a day (TID) | INTRAMUSCULAR | Status: DC | PRN

## 2021-06-08 MED ORDER — KETOROLAC TROMETHAMINE 15 MG/ML IJ SOLN
INTRAMUSCULAR | Status: AC
  Filled 2021-06-08: qty 1

## 2021-06-08 MED ORDER — ASCORBIC ACID 500 MG PO TABS
1000.0000 mg | ORAL_TABLET | Freq: Every day | ORAL | Status: DC
  Administered 2021-06-09: 1000 mg via ORAL
  Filled 2021-06-08: qty 2

## 2021-06-08 MED ORDER — TRANEXAMIC ACID-NACL 1000-0.7 MG/100ML-% IV SOLN
1000.0000 mg | INTRAVENOUS | Status: AC
  Administered 2021-06-08: 1000 mg via INTRAVENOUS
  Filled 2021-06-08: qty 100

## 2021-06-08 MED ORDER — ISOPROPYL ALCOHOL 70 % SOLN
Status: DC | PRN
  Administered 2021-06-08: 1 via TOPICAL

## 2021-06-08 MED ORDER — OXYCODONE HCL 5 MG PO TABS
10.0000 mg | ORAL_TABLET | ORAL | Status: DC | PRN
  Administered 2021-06-09: 10 mg via ORAL
  Administered 2021-06-09: 15 mg via ORAL
  Filled 2021-06-08: qty 3

## 2021-06-08 MED ORDER — SODIUM CHLORIDE 0.9 % IV SOLN: INTRAVENOUS | Status: DC

## 2021-06-08 MED ORDER — ADULT MULTIVITAMIN W/MINERALS CH
1.0000 | ORAL_TABLET | Freq: Every day | ORAL | Status: DC
  Administered 2021-06-09: 1 via ORAL
  Filled 2021-06-08: qty 1

## 2021-06-08 MED ORDER — ACETAMINOPHEN 325 MG PO TABS: 325.0000 mg | ORAL_TABLET | Freq: Four times a day (QID) | ORAL | Status: DC | PRN

## 2021-06-08 MED ORDER — CEFAZOLIN SODIUM-DEXTROSE 2-4 GM/100ML-% IV SOLN
2.0000 g | Freq: Four times a day (QID) | INTRAVENOUS | Status: AC
  Administered 2021-06-08 (×2): 2 g via INTRAVENOUS
  Filled 2021-06-08 (×2): qty 100

## 2021-06-08 MED ORDER — FINASTERIDE 1 MG PO TABS: 0.5000 mg | ORAL_TABLET | Freq: Every day | ORAL | Status: DC

## 2021-06-08 MED ORDER — POVIDONE-IODINE 10 % EX SWAB
2.0000 "application " | Freq: Once | CUTANEOUS | Status: AC
  Administered 2021-06-08: 2 via TOPICAL

## 2021-06-08 MED ORDER — FENTANYL CITRATE PF 50 MCG/ML IJ SOSY
100.0000 ug | PREFILLED_SYRINGE | INTRAMUSCULAR | Status: AC
  Administered 2021-06-08: 100 ug via INTRAVENOUS
  Filled 2021-06-08: qty 2

## 2021-06-08 MED ORDER — ZINC 50 MG PO TABS: 50.0000 mg | ORAL_TABLET | Freq: Every day | ORAL | Status: DC

## 2021-06-08 MED ORDER — CHLORHEXIDINE GLUCONATE 0.12 % MT SOLN
15.0000 mL | Freq: Once | OROMUCOSAL | Status: AC
  Administered 2021-06-08: 15 mL via OROMUCOSAL

## 2021-06-08 MED ORDER — ONDANSETRON HCL 4 MG/2ML IJ SOLN
INTRAMUSCULAR | Status: DC | PRN
Start: 2021-06-08 — End: 2021-06-08
  Administered 2021-06-08: 4 mg via INTRAVENOUS

## 2021-06-08 MED ORDER — ORAL CARE MOUTH RINSE: 15.0000 mL | Freq: Once | OROMUCOSAL | Status: AC

## 2021-06-08 MED ORDER — SODIUM CHLORIDE (PF) 0.9 % IJ SOLN
INTRAMUSCULAR | Status: AC
  Filled 2021-06-08: qty 50

## 2021-06-08 MED ORDER — ACETAMINOPHEN 160 MG/5ML PO SOLN: 325.0000 mg | ORAL | Status: DC | PRN

## 2021-06-08 MED ORDER — OXYCODONE HCL 5 MG PO TABS
5.0000 mg | ORAL_TABLET | Freq: Once | ORAL | Status: AC | PRN
  Administered 2021-06-08: 5 mg via ORAL

## 2021-06-08 MED ORDER — CEFAZOLIN SODIUM-DEXTROSE 2-4 GM/100ML-% IV SOLN
2.0000 g | INTRAVENOUS | Status: AC
  Administered 2021-06-08: 2 g via INTRAVENOUS
  Filled 2021-06-08: qty 100

## 2021-06-08 MED ORDER — LACTATED RINGERS IV SOLN: INTRAVENOUS | Status: DC

## 2021-06-08 MED ORDER — KETOROLAC TROMETHAMINE 30 MG/ML IJ SOLN
INTRAMUSCULAR | Status: DC | PRN
  Administered 2021-06-08: 30 mg

## 2021-06-08 MED ORDER — ONDANSETRON HCL 4 MG/2ML IJ SOLN: 4.0000 mg | Freq: Once | INTRAMUSCULAR | Status: DC | PRN

## 2021-06-08 MED ORDER — CARISOPRODOL 350 MG PO TABS
350.0000 mg | ORAL_TABLET | Freq: Every day | ORAL | Status: DC
  Administered 2021-06-08 – 2021-06-09 (×2): 350 mg via ORAL
  Filled 2021-06-08 (×2): qty 1

## 2021-06-08 MED ORDER — SODIUM CHLORIDE 0.9 % IR SOLN
Status: DC | PRN
  Administered 2021-06-08 (×2): 1000 mL
  Administered 2021-06-08: 3000 mL

## 2021-06-08 MED ORDER — SODIUM CHLORIDE 0.9 % IV SOLN
INTRAVENOUS | Status: DC
Start: 2021-06-08 — End: 2021-06-08

## 2021-06-08 MED ORDER — ASPIRIN 81 MG PO CHEW
81.0000 mg | CHEWABLE_TABLET | Freq: Two times a day (BID) | ORAL | Status: DC
  Administered 2021-06-08 – 2021-06-09 (×2): 81 mg via ORAL
  Filled 2021-06-08 (×2): qty 1

## 2021-06-08 MED ORDER — KETOROLAC TROMETHAMINE 30 MG/ML IJ SOLN
INTRAMUSCULAR | Status: AC
  Filled 2021-06-08: qty 1

## 2021-06-08 MED ORDER — BUPIVACAINE IN DEXTROSE 0.75-8.25 % IT SOLN
INTRATHECAL | Status: DC | PRN
  Administered 2021-06-08: 1.6 mL via INTRATHECAL

## 2021-06-08 MED ORDER — DOCUSATE SODIUM 100 MG PO CAPS
100.0000 mg | ORAL_CAPSULE | Freq: Two times a day (BID) | ORAL | Status: DC
  Administered 2021-06-08 – 2021-06-09 (×2): 100 mg via ORAL
  Filled 2021-06-08 (×2): qty 1

## 2021-06-08 MED ORDER — FENTANYL CITRATE PF 50 MCG/ML IJ SOSY: 25.0000 ug | PREFILLED_SYRINGE | INTRAMUSCULAR | Status: DC | PRN

## 2021-06-08 MED ORDER — ACETAMINOPHEN 500 MG PO TABS
1000.0000 mg | ORAL_TABLET | Freq: Once | ORAL | Status: AC
  Administered 2021-06-08: 1000 mg via ORAL
  Filled 2021-06-08: qty 2

## 2021-06-08 MED ORDER — STERILE WATER FOR IRRIGATION IR SOLN
Status: DC | PRN
  Administered 2021-06-08: 2000 mL

## 2021-06-08 MED ORDER — PHENOL 1.4 % MT LIQD: 1.0000 | OROMUCOSAL | Status: DC | PRN

## 2021-06-08 MED ORDER — CLONIDINE HCL (ANALGESIA) 100 MCG/ML EP SOLN
EPIDURAL | Status: DC | PRN
  Administered 2021-06-08: 100 ug

## 2021-06-08 MED ORDER — ONDANSETRON HCL 4 MG/2ML IJ SOLN: 4.0000 mg | Freq: Four times a day (QID) | INTRAMUSCULAR | Status: DC | PRN

## 2021-06-08 MED ORDER — BUPIVACAINE-EPINEPHRINE 0.25% -1:200000 IJ SOLN
INTRAMUSCULAR | Status: DC | PRN
  Administered 2021-06-08: 30 mL

## 2021-06-08 MED ORDER — OXYCODONE HCL 5 MG/5ML PO SOLN: 5.0000 mg | Freq: Once | ORAL | Status: AC | PRN

## 2021-06-08 MED ORDER — ACETAMINOPHEN 10 MG/ML IV SOLN
INTRAVENOUS | Status: AC
  Filled 2021-06-08: qty 100

## 2021-06-08 MED ORDER — ROPIVACAINE HCL 5 MG/ML IJ SOLN
INTRAMUSCULAR | Status: DC | PRN
  Administered 2021-06-08 (×6): 5 mL via PERINEURAL

## 2021-06-08 MED ORDER — POLYETHYLENE GLYCOL 3350 17 G PO PACK: 17.0000 g | PACK | Freq: Every day | ORAL | Status: DC | PRN

## 2021-06-08 MED ORDER — ZINC SULFATE 220 (50 ZN) MG PO CAPS
220.0000 mg | ORAL_CAPSULE | Freq: Every day | ORAL | Status: DC
  Administered 2021-06-09: 220 mg via ORAL
  Filled 2021-06-08: qty 1

## 2021-06-08 MED ORDER — ACETAMINOPHEN 10 MG/ML IV SOLN
1000.0000 mg | Freq: Once | INTRAVENOUS | Status: DC | PRN
  Administered 2021-06-08: 1000 mg via INTRAVENOUS

## 2021-06-08 MED ORDER — OXYCODONE HCL 5 MG PO TABS
5.0000 mg | ORAL_TABLET | ORAL | Status: DC | PRN
  Administered 2021-06-08: 5 mg via ORAL
  Administered 2021-06-09: 10 mg via ORAL
  Filled 2021-06-08 (×3): qty 2

## 2021-06-08 MED ORDER — NORTRIPTYLINE HCL 25 MG PO CAPS
50.0000 mg | ORAL_CAPSULE | Freq: Every day | ORAL | Status: DC
  Administered 2021-06-08: 50 mg via ORAL
  Filled 2021-06-08 (×2): qty 2

## 2021-06-08 MED ORDER — ACETAMINOPHEN 325 MG PO TABS: 325.0000 mg | ORAL_TABLET | ORAL | Status: DC | PRN

## 2021-06-08 MED ORDER — MELATONIN 5 MG PO TABS: 10.0000 mg | ORAL_TABLET | Freq: Every evening | ORAL | Status: DC | PRN

## 2021-06-08 MED ORDER — PROPOFOL 10 MG/ML IV BOLUS
INTRAVENOUS | Status: DC | PRN
  Administered 2021-06-08: 20 mg via INTRAVENOUS

## 2021-06-08 MED ORDER — CELECOXIB 200 MG PO CAPS
200.0000 mg | ORAL_CAPSULE | Freq: Two times a day (BID) | ORAL | Status: DC
  Administered 2021-06-08 – 2021-06-09 (×2): 200 mg via ORAL
  Filled 2021-06-08 (×2): qty 1

## 2021-06-08 MED ORDER — BUPIVACAINE-EPINEPHRINE (PF) 0.25% -1:200000 IJ SOLN
INTRAMUSCULAR | Status: AC
  Filled 2021-06-08: qty 30

## 2021-06-08 MED ORDER — POVIDONE-IODINE 10 % EX SWAB: 2.0000 "application " | Freq: Once | CUTANEOUS | Status: DC

## 2021-06-08 MED ORDER — DIPHENHYDRAMINE HCL 12.5 MG/5ML PO ELIX: 12.5000 mg | ORAL_SOLUTION | ORAL | Status: DC | PRN

## 2021-06-08 MED ORDER — SODIUM CHLORIDE (PF) 0.9 % IJ SOLN
INTRAMUSCULAR | Status: DC | PRN
  Administered 2021-06-08: 30 mL

## 2021-06-08 MED ORDER — DEXAMETHASONE SODIUM PHOSPHATE 10 MG/ML IJ SOLN
INTRAMUSCULAR | Status: DC | PRN
  Administered 2021-06-08: 10 mg via INTRAVENOUS

## 2021-06-08 MED ORDER — ONDANSETRON HCL 4 MG PO TABS: 4.0000 mg | ORAL_TABLET | Freq: Four times a day (QID) | ORAL | Status: DC | PRN

## 2021-06-08 MED ORDER — SENNA 8.6 MG PO TABS
1.0000 | ORAL_TABLET | Freq: Two times a day (BID) | ORAL | Status: DC
  Administered 2021-06-08 – 2021-06-09 (×2): 8.6 mg via ORAL
  Filled 2021-06-08 (×2): qty 1

## 2021-06-08 MED ORDER — MENTHOL 3 MG MT LOZG: 1.0000 | LOZENGE | OROMUCOSAL | Status: DC | PRN

## 2021-06-08 MED ORDER — ALUM & MAG HYDROXIDE-SIMETH 200-200-20 MG/5ML PO SUSP: 30.0000 mL | ORAL | Status: DC | PRN

## 2021-06-08 MED ORDER — METHOCARBAMOL 1000 MG/10ML IJ SOLN
500.0000 mg | Freq: Four times a day (QID) | INTRAVENOUS | Status: DC | PRN
  Filled 2021-06-08: qty 5

## 2021-06-08 MED ORDER — METOCLOPRAMIDE HCL 5 MG PO TABS: 5.0000 mg | ORAL_TABLET | Freq: Three times a day (TID) | ORAL | Status: DC | PRN

## 2021-06-08 MED ORDER — HYDROMORPHONE HCL 1 MG/ML IJ SOLN
0.5000 mg | INTRAMUSCULAR | Status: DC | PRN
  Administered 2021-06-08 – 2021-06-09 (×3): 1 mg via INTRAVENOUS
  Filled 2021-06-08 (×3): qty 1

## 2021-06-08 MED ORDER — MIDAZOLAM HCL 2 MG/2ML IJ SOLN
2.0000 mg | INTRAMUSCULAR | Status: AC
  Administered 2021-06-08: 2 mg via INTRAVENOUS
  Filled 2021-06-08: qty 2

## 2021-06-08 MED ORDER — DEXAMETHASONE SODIUM PHOSPHATE 10 MG/ML IJ SOLN
10.0000 mg | Freq: Once | INTRAMUSCULAR | Status: AC
  Administered 2021-06-09: 10 mg via INTRAVENOUS
  Filled 2021-06-08: qty 1

## 2021-06-08 MED ORDER — OXYCODONE HCL 5 MG PO TABS
ORAL_TABLET | ORAL | Status: AC
  Filled 2021-06-08: qty 1

## 2021-06-08 MED ORDER — SUVOREXANT 15 MG PO TABS: 1.0000 | ORAL_TABLET | Freq: Every evening | ORAL | Status: DC | PRN

## 2021-06-08 MED ORDER — METHOCARBAMOL 500 MG PO TABS
500.0000 mg | ORAL_TABLET | Freq: Four times a day (QID) | ORAL | Status: DC | PRN
  Administered 2021-06-08 – 2021-06-09 (×2): 500 mg via ORAL
  Filled 2021-06-08 (×2): qty 1

## 2021-06-08 MED ORDER — DEXAMETHASONE SODIUM PHOSPHATE 10 MG/ML IJ SOLN
INTRAMUSCULAR | Status: DC | PRN
  Administered 2021-06-08: 10 mg

## 2021-06-08 MED ORDER — METOPROLOL SUCCINATE ER 25 MG PO TB24
25.0000 mg | ORAL_TABLET | Freq: Every day | ORAL | Status: DC
  Administered 2021-06-09: 25 mg via ORAL
  Filled 2021-06-08: qty 1

## 2021-06-08 MED ORDER — KETOROLAC TROMETHAMINE 15 MG/ML IJ SOLN: 15.0000 mg | Freq: Once | INTRAMUSCULAR | Status: DC

## 2021-06-08 MED ORDER — PROPOFOL 500 MG/50ML IV EMUL
INTRAVENOUS | Status: DC | PRN
  Administered 2021-06-08: 120 ug/kg/min via INTRAVENOUS

## 2021-06-08 MED ORDER — ZOLPIDEM TARTRATE 5 MG PO TABS: 5.0000 mg | ORAL_TABLET | Freq: Every evening | ORAL | Status: DC | PRN

## 2021-06-08 SURGICAL SUPPLY — 67 items
BAG COUNTER SPONGE SURGICOUNT (BAG) IMPLANT
BAG ZIPLOCK 12X15 (MISCELLANEOUS) IMPLANT
BATTERY INSTRU NAVIGATION (MISCELLANEOUS) ×6 IMPLANT
BLADE SAW RECIPROCATING 77.5 (BLADE) ×2 IMPLANT
BNDG ELASTIC 4X5.8 VLCR STR LF (GAUZE/BANDAGES/DRESSINGS) ×2 IMPLANT
BNDG ELASTIC 6X5.8 VLCR STR LF (GAUZE/BANDAGES/DRESSINGS) ×2 IMPLANT
CEMENT BONE R 1X40 (Cement) ×2 IMPLANT
CHLORAPREP W/TINT 26 (MISCELLANEOUS) ×4 IMPLANT
COMP FEM CMT STD PS 12 RT (Joint) ×2 IMPLANT
COMP PATELLA POR NG 41X10 (Knees) ×2 IMPLANT
COMP TIB PS KNEE 0D H RT (Joint) ×2 IMPLANT
COMPONENT FEM CMT STD PS 12 RT (Joint) IMPLANT
COMPONENT PATELLA POR NG 41X10 (Knees) IMPLANT
COMPONET TIB PS KNEE 0D H RT (Joint) IMPLANT
COVER SURGICAL LIGHT HANDLE (MISCELLANEOUS) ×2 IMPLANT
DERMABOND ADVANCED (GAUZE/BANDAGES/DRESSINGS) ×2
DERMABOND ADVANCED .7 DNX12 (GAUZE/BANDAGES/DRESSINGS) ×2 IMPLANT
DRAPE INCISE IOBAN 66X45 STRL (DRAPES) ×2 IMPLANT
DRAPE SHEET LG 3/4 BI-LAMINATE (DRAPES) ×6 IMPLANT
DRAPE U-SHAPE 47X51 STRL (DRAPES) ×2 IMPLANT
DRSG AQUACEL AG ADV 3.5X14 (GAUZE/BANDAGES/DRESSINGS) ×2 IMPLANT
ELECT BLADE TIP CTD 4 INCH (ELECTRODE) ×2 IMPLANT
ELECT REM PT RETURN 15FT ADLT (MISCELLANEOUS) ×2 IMPLANT
GAUZE SPONGE 4X4 12PLY STRL (GAUZE/BANDAGES/DRESSINGS) ×2 IMPLANT
GLOVE SRG 8 PF TXTR STRL LF DI (GLOVE) ×2 IMPLANT
GLOVE SURG ENC MOIS LTX SZ8.5 (GLOVE) ×4 IMPLANT
GLOVE SURG ENC TEXT LTX SZ7.5 (GLOVE) ×6 IMPLANT
GLOVE SURG UNDER POLY LF SZ8 (GLOVE) ×4
GLOVE SURG UNDER POLY LF SZ8.5 (GLOVE) ×2 IMPLANT
GOWN SPEC L3 XXLG W/TWL (GOWN DISPOSABLE) ×4 IMPLANT
HANDPIECE INTERPULSE COAX TIP (DISPOSABLE) ×2
HDLS TROCR DRIL PIN KNEE 75 (PIN) ×2
HOLDER FOLEY CATH W/STRAP (MISCELLANEOUS) ×2 IMPLANT
HOOD PEEL AWAY FLYTE STAYCOOL (MISCELLANEOUS) ×6 IMPLANT
JET LAVAGE IRRISEPT WOUND (IRRIGATION / IRRIGATOR)
KIT TURNOVER KIT A (KITS) IMPLANT
LAVAGE JET IRRISEPT WOUND (IRRIGATION / IRRIGATOR) IMPLANT
LINER TIB PS GH/7-12 12 RT (Liner) ×1 IMPLANT
MARKER SKIN DUAL TIP RULER LAB (MISCELLANEOUS) ×2 IMPLANT
NDL SAFETY ECLIPSE 18X1.5 (NEEDLE) ×1 IMPLANT
NDL SPNL 18GX3.5 QUINCKE PK (NEEDLE) ×1 IMPLANT
NEEDLE HYPO 18GX1.5 SHARP (NEEDLE) ×2
NEEDLE SPNL 18GX3.5 QUINCKE PK (NEEDLE) ×2 IMPLANT
NS IRRIG 1000ML POUR BTL (IV SOLUTION) ×2 IMPLANT
PACK TOTAL KNEE CUSTOM (KITS) ×2 IMPLANT
PADDING CAST COTTON 6X4 STRL (CAST SUPPLIES) ×2 IMPLANT
PIN DRILL HDLS TROCAR 75 4PK (PIN) IMPLANT
PROTECTOR NERVE ULNAR (MISCELLANEOUS) ×2 IMPLANT
SAW OSC TIP CART 19.5X105X1.3 (SAW) ×2 IMPLANT
SEALER BIPOLAR AQUA 6.0 (INSTRUMENTS) ×2 IMPLANT
SET HNDPC FAN SPRY TIP SCT (DISPOSABLE) ×1 IMPLANT
SET PAD KNEE POSITIONER (MISCELLANEOUS) ×2 IMPLANT
SPIKE FLUID TRANSFER (MISCELLANEOUS) ×4 IMPLANT
SPONGE T-LAP 18X18 ~~LOC~~+RFID (SPONGE) ×6 IMPLANT
SUT MNCRL AB 3-0 PS2 18 (SUTURE) ×2 IMPLANT
SUT MNCRL AB 4-0 PS2 18 (SUTURE) ×2 IMPLANT
SUT MON AB 2-0 CT1 36 (SUTURE) ×2 IMPLANT
SUT STRATAFIX PDO 1 14 VIOLET (SUTURE) ×2
SUT STRATFX PDO 1 14 VIOLET (SUTURE) ×1
SUT VIC AB 1 CTX 36 (SUTURE) ×4
SUT VIC AB 1 CTX36XBRD ANBCTR (SUTURE) ×2 IMPLANT
SUT VIC AB 2-0 CT1 27 (SUTURE) ×2
SUT VIC AB 2-0 CT1 TAPERPNT 27 (SUTURE) ×1 IMPLANT
SUTURE STRATFX PDO 1 14 VIOLET (SUTURE) ×1 IMPLANT
TRAY FOLEY MTR SLVR 16FR STAT (SET/KITS/TRAYS/PACK) IMPLANT
TUBE SUCTION HIGH CAP CLEAR NV (SUCTIONS) ×2 IMPLANT
WATER STERILE IRR 1000ML POUR (IV SOLUTION) ×4 IMPLANT

## 2021-06-08 NOTE — Anesthesia Preprocedure Evaluation (Addendum)
Anesthesia Evaluation  ?Patient identified by MRN, date of birth, ID band ?Patient awake ? ? ? ?Reviewed: ?Allergy & Precautions, NPO status , Patient's Chart, lab work & pertinent test results, reviewed documented beta blocker date and time  ? ?Airway ?Mallampati: I ? ? ? ? ? ? Dental ?no notable dental hx. ? ?  ?Pulmonary ?former smoker,  ?  ?Pulmonary exam normal ? ? ? ? ? ? ? Cardiovascular ?hypertension, Pt. on medications and Pt. on home beta blockers ?+ Peripheral Vascular Disease  ?Normal cardiovascular exam ? ? ?  ?Neuro/Psych ?negative psych ROS  ? GI/Hepatic ?Neg liver ROS,   ?Endo/Other  ?negative endocrine ROS ? Renal/GU ?negative Renal ROS  ?negative genitourinary ?  ?Musculoskeletal ? ?(+) Arthritis , Osteoarthritis,   ? Abdominal ?Normal abdominal exam  (+)   ?Peds ? Hematology ? ?(+) Blood dyscrasia, anemia ,   ?Anesthesia Other Findings ?Janina Mayo, MD  ?04/20/2021 ?3:30 PM EST  ? ?Hi Jenna, please let Mr Sweetin know that he does go into fast heart runs but predominately he has a normal heart rhythm. He can continue metoprolol. He has low risk for a significant cardiac event for knee replacement ? ? ??Sanda Klein, MD  ?05/25/2020 ?8:59 PM EST  ? ?Normal , very reassuring echo. Normal left atrial size lessens the likelihood of future AFib. ?Borderline dilation of the aorta, not in any dangerous range. ? ? ?? ? ? ? ? ? Reproductive/Obstetrics ? ?  ? ? ? ? ? ? ? ? ? ? ? ? ? ?  ?  ? ? ? ? ? ? ? ?Anesthesia Physical ?Anesthesia Plan ? ?ASA: 2 ? ?Anesthesia Plan: Spinal  ? ?Post-op Pain Management: Regional block*  ? ?Induction:  ? ?PONV Risk Score and Plan: 1 and Ondansetron, Dexamethasone, TIVA, Midazolam and Propofol infusion ? ?Airway Management Planned: Natural Airway and Simple Face Mask ? ?Additional Equipment: None ? ?Intra-op Plan:  ? ?Post-operative Plan:  ? ?Informed Consent: I have reviewed the patients History and Physical, chart, labs and discussed the  procedure including the risks, benefits and alternatives for the proposed anesthesia with the patient or authorized representative who has indicated his/her understanding and acceptance.  ? ? ? ? ? ?Plan Discussed with: CRNA ? ?Anesthesia Plan Comments:   ? ? ? ? ? ?Anesthesia Quick Evaluation ? ?

## 2021-06-08 NOTE — Interval H&P Note (Signed)
History and Physical Interval Note: ? ?06/08/2021 ?10:24 AM ? ?Joseph [name]  has presented today for surgery, with the diagnosis of Right knee osteoarthritis.  The various methods of treatment have been discussed with the patient and family. After consideration of risks, benefits and other options for treatment, the patient has consented to  Procedure(s): ?COMPUTER ASSISTED TOTAL KNEE ARTHROPLASTY (Right) as a surgical intervention.  The patient's history has been reviewed, patient examined, no change in status, stable for surgery.  I have reviewed the patient's chart and labs.  Questions were answered to the patient's satisfaction.   ? ?The risks, benefits, and alternatives were discussed with the patient. There are risks associated with the surgery including, but not limited to, problems with anesthesia (death), infection, instability (giving out of the joint), dislocation, differences in leg length/angulation/rotation, fracture of bones, loosening or failure of implants, hematoma (blood accumulation) which may require surgical drainage, blood clots, pulmonary embolism, nerve injury (foot drop and lateral thigh numbness), and blood vessel injury. The patient understands these risks and elects to proceed. ? ? ? ?[place] Layne Dilauro ? ? ?

## 2021-06-08 NOTE — Op Note (Signed)
OPERATIVE REPORT ? ?SURGEON: Rod Can, MD  ? ?ASSISTANT: Nehemiah Massed, PA-C ?Larene Pickett, PA-C ? ?PREOPERATIVE DIAGNOSIS: Primary Right knee arthritis.  ? ?POSTOPERATIVE DIAGNOSIS: Primary Right knee arthritis.  ? ?PROCEDURE: Computer assisted Right total knee arthroplasty.  ? ?IMPLANTS: Zimmer Persona Cemented CR femur, size 12. ?Persona 0 degree Spiked Keel OsseoTi Tibia, size H. ?Vivacit-E polyethelyene insert, size 10 mm, CR. ?TM standard patella, size 41 mm. ?Biomet R bone cement. ? ?ANESTHESIA:  MAC, Regional, and Spinal ? ?TOURNIQUET TIME: Not utilized.  ? ?ESTIMATED BLOOD LOSS:-350 mL   ? ?ANTIBIOTICS: 2g Ancef. ? ?DRAINS: None. ? ?COMPLICATIONS: None ?  ?CONDITION: PACU - hemodynamically stable.  ? ?BRIEF CLINICAL NOTE: Joseph [name] is a 66 y.o. male with a long-standing history of Right knee arthritis. After failing conservative management, the patient was indicated for total knee arthroplasty. The risks, benefits, and alternatives to the procedure were explained, and the patient elected to proceed. ? ?PROCEDURE IN DETAIL: Adductor canal block was obtained in the pre-op holding area. Once inside the operative room, spinal anesthesia was obtained, and a foley catheter was inserted. The patient was then positioned and the lower extremity was prepped and draped in the normal sterile surgical fashion.  A time-out was called verifying side and site of surgery. The patient received IV antibiotics within 60 minutes of beginning the procedure. A tourniquet was not utilized. ?  ?An anterior approach to the knee was performed utilizing a midvastus arthrotomy. A medial release was performed and the patellar fat pad was excised. Stryker imageless navigation was used to cut the distal femur perpendicular to the mechanical axis. A freehand patellar resection was performed, and the patella was sized an prepared with 3 lug holes. ? ?Nagivation was used to make a neutral proximal tibia resection, taking 9 mm of  bone from the less affected lateral side with 3 degrees of slope. The menisci were excised. A spacer block was placed, and the alignment and balance in extension were confirmed.  ? ?The distal femur was sized using the 3-degree external rotation guide referencing the posterior femoral cortex. The appropriate 4-in-1 cutting block was pinned into place. Rotation was checked using Whiteside's line, the epicondylar axis, and then confirmed with a spacer block in flexion. The remaining femoral cuts were performed, taking care to protect the MCL. ? ?The tibia was sized and the trial tray was pinned into place. The remaining trail components were inserted. The knee was stable to varus and valgus stress through a full range of motion. The patella tracked centrally, and the PCL was well balanced. The trial components were removed, and the proximal tibial surface was prepared. Final tibial component and poly liner were impacted, the femoral component was cemented, and the knee was brought into extension while the cement polymerized. Excess cement was cleared. The patellar component was impacted into place. The knee was tested for a final time and found to be well balanced. ?  ?The wound was copiously irrigated with Irrisept solution and normal saline using pule lavage.  Marcaine solution was injected into the periarticular soft tissue.  The wound was closed in layers using #1 Vicryl and Stratafix for the fascia, 2-0 Vicryl for the subcutaneous fat, 2-0 Monocryl for the deep dermal layer, 3-0 running Monocryl subcuticular Stitch, and 4-0 Monocryl stay sutures at both ends of the wound. Dermabond was applied to the skin.  Once the glue was fully dried, an Aquacell Ag and compressive dressing were applied.  The patient was transported  to the recovery room in stable condition.  Sponge, needle, and instrument counts were correct at the end of the case x2.  The patient tolerated the procedure well and there were no known  complications. ? ?Please note that a surgical assistant was a medical necessity for this procedure in order to perform it in a safe and expeditious manner. Surgical assistant was necessary to retract the ligaments and vital neurovascular structures to prevent injury to them and also necessary for proper positioning of the limb to allow for anatomic placement of the prosthesis. ?

## 2021-06-08 NOTE — Transfer of Care (Signed)
Immediate Anesthesia Transfer of Care Note ? ?Patient: Joseph [name] ? ?Procedure(s) Performed: COMPUTER ASSISTED TOTAL KNEE ARTHROPLASTY (Right: Knee) ? ?Patient Location: PACU ? ?Anesthesia Type:Spinal ? ?Level of Consciousness: awake, alert  and oriented ? ?Airway & Oxygen Therapy: Patient Spontanous Breathing ? ?Post-op Assessment: Report given to RN and Post -op Vital signs reviewed and stable ? ?Post vital signs: Reviewed and stable ? ?Last Vitals:  ?Vitals Value Taken Time  ?BP 139/95 06/08/21 1439  ?Temp    ?Pulse 94 06/08/21 1443  ?Resp 13 06/08/21 1443  ?SpO2 96 % 06/08/21 1443  ?Vitals shown include unvalidated device data. ? ?Last Pain:  ?Vitals:  ? 06/08/21 0822  ?TempSrc:   ?PainSc: 0-No pain  ?   ? ?  ? ?Complications: No notable events documented. ?

## 2021-06-08 NOTE — Addendum Note (Signed)
Addendum  created 06/08/21 1527 by Lyn Hollingshead, MD  ? Clinical Note Signed, Intraprocedure Blocks edited  ?  ?

## 2021-06-08 NOTE — Progress Notes (Signed)
Assisted Dr. Hatchett with right, ultrasound guided, adductor canal block. Side rails up, monitors on throughout procedure. See vital signs in flow sheet. Tolerated Procedure well.  

## 2021-06-08 NOTE — Anesthesia Procedure Notes (Addendum)
Anesthesia Regional Block: Adductor canal block  ? ?Pre-Anesthetic Checklist: , timeout performed,  Correct Patient, Correct Site, Correct Laterality,  Correct Procedure, Correct Position, site marked,  Risks and benefits discussed,  Surgical consent,  Pre-op evaluation,  At surgeon's request and post-op pain management ? ?Laterality: Lower and Right ? ?Prep: chloraprep     ?  ?Needles:  ?Injection technique: Single-shot ? ?Needle Type: Echogenic Stimulator Needle   ? ? ?Needle Length: 9cm  ?Needle Gauge: 20  ? ?Needle insertion depth: 1.5 cm ? ? ?Additional Needles: ? ? ?Procedures:,,,, ultrasound used (permanent image in chart),,    ?Narrative:  ?Start time: 06/08/2021 10:20 AM ?End time: 06/08/2021 10:27 AM ?Injection made incrementally with aspirations every 5 mL. ? ?Performed by: Personally  ?Anesthesiologist: Lyn Hollingshead, MD ? ? ? ? ?

## 2021-06-08 NOTE — Discharge Instructions (Signed)
 Dr. Kareli Hossain Total Joint Specialist [facility] [address] [place], [place] [address] [phone]  TOTAL KNEE REPLACEMENT POSTOPERATIVE DIRECTIONS    Knee Rehabilitation, Guidelines Following Surgery  Results after knee surgery are often greatly improved when you follow the exercise, range of motion and muscle strengthening exercises prescribed by your doctor. Safety measures are also important to protect the knee from further injury. Any time any of these exercises cause you to have increased pain or swelling in your knee joint, decrease the amount until you are comfortable again and slowly increase them. If you have problems or questions, call your caregiver or physical therapist for advice.   WEIGHT BEARING Weight bearing as tolerated with assist device (walker, cane, etc) as directed, use it as long as suggested by your surgeon or therapist, typically at least 4-6 weeks.  HOME CARE INSTRUCTIONS  Remove items at home which could result in a fall. This includes throw rugs or furniture in walking pathways.  Continue medications as instructed at time of discharge. You may have some home medications which will be placed on hold until you complete the course of blood thinner medication.  You may start showering once you are discharged home but do not submerge the incision under water. Just pat the incision dry and apply a dry gauze dressing on daily. Walk with walker as instructed.  You may resume a sexual relationship in one month or when given the OK by your doctor.  Use walker as long as suggested by your caregivers. Avoid periods of inactivity such as sitting longer than an hour when not asleep. This helps prevent blood clots.  You may put full weight on your legs and walk as much as is comfortable.  You may return to work once you are cleared by your doctor.  Do not drive a car for 6 weeks or until released by you surgeon.  Do not drive while  taking narcotics.  Wear the elastic stockings for three weeks following surgery during the day but you may remove then at night. Make sure you keep all of your appointments after your operation with all of your doctors and caregivers. You should call the office at the above phone number and make an appointment for approximately two weeks after the date of your surgery. Do not remove your surgical dressing. The dressing is waterproof; you may take showers in 3 days, but do not take tub baths or submerge the dressing. Please pick up a stool softener and laxative for home use as long as you are requiring pain medications. ICE to the affected knee every three hours for 30 minutes at a time and then as needed for pain and swelling.  Continue to use ice on the knee for pain and swelling from surgery. You may notice swelling that will progress down to the foot and ankle.  This is normal after surgery.  Elevate the leg when you are not up walking on it.   It is important for you to complete the blood thinner medication as prescribed by your doctor. Continue to use the breathing machine which will help keep your temperature down.  It is common for your temperature to cycle up and down following surgery, especially at night when you are not up moving around and exerting yourself.  The breathing machine keeps your lungs expanded and your temperature down.  RANGE OF MOTION AND STRENGTHENING EXERCISES  Rehabilitation of the knee is important following a knee injury or an   operation. After just a few days of immobilization, the muscles of the thigh which control the knee become weakened and shrink (atrophy). Knee exercises are designed to build up the tone and strength of the thigh muscles and to improve knee motion. Often times heat used for twenty to thirty minutes before working out will loosen up your tissues and help with improving the range of motion but do not use heat for the first two weeks following surgery.  These exercises can be done on a training (exercise) mat, on the floor, on a table or on a bed. Use what ever works the best and is most comfortable for you Knee exercises include:  Leg Lifts - While your knee is still immobilized in a splint or cast, you can do straight leg raises. Lift the leg to 60 degrees, hold for 3 sec, and slowly lower the leg. Repeat 10-20 times 2-3 times daily. Perform this exercise against resistance later as your knee gets better.  Quad and Hamstring Sets - Tighten up the muscle on the front of the thigh (Quad) and hold for 5-10 sec. Repeat this 10-20 times hourly. Hamstring sets are done by pushing the foot backward against an object and holding for 5-10 sec. Repeat as with quad sets.  A rehabilitation program following serious knee injuries can speed recovery and prevent re-injury in the future due to weakened muscles. Contact your doctor or a physical therapist for more information on knee rehabilitation.   POST-OPERATIVE OPIOID TAPER INSTRUCTIONS: It is important to wean off of your opioid medication as soon as possible. If you do not need pain medication after your surgery it is ok to stop day one. Opioids include: Codeine, Hydrocodone(Norco, Vicodin), Oxycodone(Percocet, oxycontin) and hydromorphone amongst others.  Long term and even short term use of opiods can cause: Increased pain response Dependence Constipation Depression Respiratory depression And more.  Withdrawal symptoms can include Flu like symptoms Nausea, vomiting And more Techniques to manage these symptoms Hydrate well Eat regular healthy meals Stay active Use relaxation techniques(deep breathing, meditating, yoga) Do Not substitute Alcohol to help with tapering If you have been on opioids for less than two weeks and do not have pain than it is ok to stop all together.  Plan to wean off of opioids This plan should start within one week post op of your joint replacement. Maintain the same  interval or time between taking each dose and first decrease the dose.  Cut the total daily intake of opioids by one tablet each day Next start to increase the time between doses. The last dose that should be eliminated is the evening dose.    SKILLED REHAB INSTRUCTIONS: If the patient is transferred to a skilled rehab facility following release from the hospital, a list of the current medications will be sent to the facility for the patient to continue.  When discharged from the skilled rehab facility, please have the facility set up the patient's Home Health Physical Therapy prior to being released. Also, the skilled facility will be responsible for providing the patient with their medications at time of release from the facility to include their pain medication, the muscle relaxants, and their blood thinner medication. If the patient is still at the rehab facility at time of the two week follow up appointment, the skilled rehab facility will also need to assist the patient in arranging follow up appointment in our office and any transportation needs.  MAKE SURE YOU:  Understand these instructions.  Will watch   your condition.  Will get help right away if you are not doing well or get worse.    Pick up stool softner and laxative for home use following surgery while on pain medications. Do NOT remove your dressing. You may shower.  Do not take tub baths or submerge incision under water. May shower starting three days after surgery. Please use a clean towel to pat the incision dry following showers. Continue to use ice for pain and swelling after surgery. Do not use any lotions or creams on the incision until instructed by your surgeon.  

## 2021-06-08 NOTE — Anesthesia Postprocedure Evaluation (Signed)
Anesthesia Post Note ? ?Patient: Joseph [name] ? ?Procedure(s) Performed: COMPUTER ASSISTED TOTAL KNEE ARTHROPLASTY (Right: Knee) ? ?  ? ?Patient location during evaluation: PACU ?Anesthesia Type: Spinal ?Level of consciousness: awake ?Pain management: pain level controlled ?Vital Signs Assessment: post-procedure vital signs reviewed and stable ?Respiratory status: spontaneous breathing ?Cardiovascular status: stable ?Postop Assessment: no headache, no backache, spinal receding, patient able to bend at knees and no apparent nausea or vomiting ?Anesthetic complications: no ? ? ?No notable events documented. ? ?Last Vitals:  ?Vitals:  ? 06/08/21 1500 06/08/21 1515  ?BP: 135/90 (!) 141/93  ?Pulse: 84 81  ?Resp: 12 12  ?Temp:    ?SpO2: 96% 96%  ?  ?Last Pain:  ?Vitals:  ? 06/08/21 1518  ?TempSrc:   ?PainSc: 2   ? ? ?  ?  ?  ?  ?  ?  ? ?Huston Foley ? ? ? ? ?

## 2021-06-08 NOTE — Anesthesia Procedure Notes (Signed)
Spinal ? ?Patient location during procedure: OR ?Start time: 06/08/2021 11:23 AM ?End time: 06/08/2021 11:24 AM ?Reason for block: surgical anesthesia ?Staffing ?Performed: anesthesiologist  ?Anesthesiologist: Lyn Hollingshead, MD ?Preanesthetic Checklist ?Completed: patient identified, IV checked, site marked, risks and benefits discussed, surgical consent, monitors and equipment checked, pre-op evaluation and timeout performed ?Spinal Block ?Patient position: sitting ?Prep: DuraPrep and site prepped and draped ?Patient monitoring: continuous pulse ox and blood pressure ?Approach: midline ?Location: L3-4 ?Injection technique: single-shot ?Needle ?Needle type: Pencan  ?Needle gauge: 24 G ?Needle length: 10 cm ?Needle insertion depth: 5 cm ?Assessment ?Sensory level: T8 ?Events: CSF return and second provider ? ? ? ?

## 2021-06-09 ENCOUNTER — Encounter (HOSPITAL_COMMUNITY): Payer: Self-pay | Admitting: Orthopedic Surgery

## 2021-06-09 DIAGNOSIS — M1711 Unilateral primary osteoarthritis, right knee: Secondary | ICD-10-CM | POA: Diagnosis not present

## 2021-06-09 LAB — CBC
HCT: 30.1 % — ABNORMAL LOW (ref 39.0–52.0)
Hemoglobin: 9.9 g/dL — ABNORMAL LOW (ref 13.0–17.0)
MCH: 31.3 pg (ref 26.0–34.0)
MCHC: 32.9 g/dL (ref 30.0–36.0)
MCV: 95.3 fL (ref 80.0–100.0)
Platelets: 308 10*3/uL (ref 150–400)
RBC: 3.16 MIL/uL — ABNORMAL LOW (ref 4.22–5.81)
RDW: 12.6 % (ref 11.5–15.5)
WBC: 16.5 10*3/uL — ABNORMAL HIGH (ref 4.0–10.5)
nRBC: 0 % (ref 0.0–0.2)

## 2021-06-09 LAB — BASIC METABOLIC PANEL
Anion gap: 7 (ref 5–15)
BUN: 17 mg/dL (ref 8–23)
CO2: 23 mmol/L (ref 22–32)
Calcium: 8 mg/dL — ABNORMAL LOW (ref 8.9–10.3)
Chloride: 98 mmol/L (ref 98–111)
Creatinine, Ser: 0.97 mg/dL (ref 0.61–1.24)
GFR, Estimated: >60 mL/min (ref >60)
Glucose, Bld: 157 mg/dL — ABNORMAL HIGH (ref 70–99)
Potassium: 4.1 mmol/L (ref 3.5–5.1)
Sodium: 128 mmol/L — ABNORMAL LOW (ref 135–145)

## 2021-06-09 MED ORDER — METHOCARBAMOL 500 MG PO TABS
500.0000 mg | ORAL_TABLET | Freq: Four times a day (QID) | ORAL | 0 refills | Status: DC | PRN
Start: 2021-06-09 — End: 2021-11-24

## 2021-06-09 MED ORDER — LOSARTAN POTASSIUM 50 MG PO TABS
50.0000 mg | ORAL_TABLET | Freq: Every day | ORAL | Status: DC
  Administered 2021-06-09: 50 mg via ORAL
  Filled 2021-06-09: qty 1

## 2021-06-09 MED ORDER — ASPIRIN 81 MG PO CHEW: 81.0000 mg | CHEWABLE_TABLET | Freq: Two times a day (BID) | ORAL | 0 refills | Status: AC

## 2021-06-09 MED ORDER — OXYCODONE HCL 5 MG PO TABS: 5.0000 mg | ORAL_TABLET | ORAL | 0 refills | Status: DC | PRN

## 2021-06-09 MED ORDER — ONDANSETRON HCL 4 MG PO TABS: 4.0000 mg | ORAL_TABLET | Freq: Four times a day (QID) | ORAL | 0 refills | Status: DC | PRN

## 2021-06-09 MED ORDER — POLYETHYLENE GLYCOL 3350 17 G PO PACK: 17.0000 g | PACK | Freq: Every day | ORAL | 0 refills | Status: DC | PRN

## 2021-06-09 MED ORDER — SENNA 8.6 MG PO TABS
2.0000 | ORAL_TABLET | Freq: Every day | ORAL | 0 refills | Status: DC
Start: 2021-06-09 — End: 2021-11-24

## 2021-06-09 NOTE — Progress Notes (Signed)
Subjective: ?1 Day Post-Op Procedure(s) (LRB): ?COMPUTER ASSISTED TOTAL KNEE ARTHROPLASTY (Right) ? ?Patient reports pain as moderate. Patient reports pain is controlled currently. Denies N/V, chest pain, SOB. ? ?Objective: ?Vital signs in last 24 hours: ?Temp:  [97.9 ?F (36.6 ?C)-98.5 ?F (36.9 ?C)] 98 ?F (36.7 ?C) (03/17 1337) ?Pulse Rate:  [71-98] 71 (03/17 1337) ?Resp:  [16-18] 16 (03/17 1337) ?BP: (133-143)/(76-89) 139/76 (03/17 1337) ?SpO2:  [95 %-97 %] 96 % (03/17 1337) ? ?Intake/Output from previous day: ?03/16 0701 - 03/17 0700 ?In: 4448.5 [P.O.:840; I.V.:3208.5; IV Piggyback:400] ?Out: 2400 [Urine:2050; Blood:350] ?Intake/Output this shift: ?Total I/O ?In: -  ?Out: 1100 [Urine:1100] ? ?Recent Labs  ?  06/09/21 ?0315  ?HGB 9.9*  ? ?Recent Labs  ?  06/09/21 ?0315  ?WBC 16.5*  ?RBC 3.16*  ?HCT 30.1*  ?PLT 308  ? ?Recent Labs  ?  06/09/21 ?0315  ?NA 128*  ?K 4.1  ?CL 98  ?CO2 23  ?BUN 17  ?CREATININE 0.97  ?GLUCOSE 157*  ?CALCIUM 8.0*  ? ?No results for input(s): LABPT, INR in the last 72 hours. ? ?ABD soft ?Sensation intact distally ?Intact pulses distally ?Dorsiflexion/Plantar flexion intact ?Incision: dressing C/D/I ?Compartment soft ? ? ?Assessment/Plan: ?1 Day Post-Op Procedure(s) (LRB): ?COMPUTER ASSISTED TOTAL KNEE ARTHROPLASTY (Right) ? ?Principal Problem: ?  Osteoarthritis right knee ?Active Problem: ?  Degenerative arthritis right knee ? ? ?WBAT with walker  ?DVT prophylaxis: Aspirin, SCDs, TEDs ?PO pain control: pain controlled on current regimen. ?PT/OT: Per PT patient progressing well with mobility, performed HEP. Cleared PT.  ?Dispo: D/C home with OPPT. ? ? ?Charlott Rakes ?06/09/2021, 4:23 PM ? ? ?Larene Pickett, PA-C ?Orthopedic Surgery ?[facility] ? ? ?

## 2021-06-09 NOTE — Progress Notes (Signed)
Physical Therapy Treatment ?Patient Details ?Name: Joseph [name] ?MRN: [id number] ?DOB: [birth year] ?Today's Date: 06/09/2021 ? ? ?History of Present Illness Pt s/p R TKR and with hx of spinal stenosis, neurogenic claudication and back surgery ? ?  ?PT Comments  ? ? Pt motivated and progressing well with mobility.  Pt up to ambulate in hall, negotiated stairs multiple ways, and performed HEP with written instruction provided and reviewed.  Multiple questions asked and answered.  Pt eager for dc home this date.  ?Recommendations for follow up therapy are one component of a multi-disciplinary discharge planning process, led by the attending physician.  Recommendations may be updated based on patient status, additional functional criteria and insurance authorization. ? ?Follow Up Recommendations ? Follow physician's recommendations for discharge plan and follow up therapies ?  ?  ?Assistance Recommended at Discharge Intermittent Supervision/Assistance  ?Patient can return home with the following A little help with walking and/or transfers;A little help with bathing/dressing/bathroom;Assist for transportation;Help with stairs or ramp for entrance ?  ?Equipment Recommendations ? None recommended by PT  ?  ?Recommendations for Other Services   ? ? ?  ?Precautions / Restrictions Precautions ?Precautions: Knee ?Restrictions ?Weight Bearing Restrictions: No ?Other Position/Activity Restrictions: WBAT  ?  ? ?Mobility ? Bed Mobility ?Overal bed mobility: Needs Assistance ?Bed Mobility: Supine to Sit, Sit to Supine ?  ?  ?Supine to sit: Supervision ?Sit to supine: Supervision ?  ?General bed mobility comments: no physical assist ?  ? ?Transfers ?Overall transfer level: Needs assistance ?Equipment used: Rolling walker (2 wheels) ?Transfers: Sit to/from Stand ?Sit to Stand: Min guard, Supervision ?  ?  ?  ?  ?  ?General transfer comment: cues for LE management and use of UEs to self assist ?  ? ?Ambulation/Gait ?Ambulation/Gait  assistance: Min guard, Supervision ?Gait Distance (Feet): 120 Feet ?Assistive device: Rolling walker (2 wheels) ?Gait Pattern/deviations: Decreased step length - right, Decreased step length - left, Shuffle, Trunk flexed, Step-to pattern, Step-through pattern ?  ?  ?  ?General Gait Details: cues for posture, position from RW, and sequence ? ? ?Stairs ?Stairs: Yes ?Stairs assistance: Min assist ?Stair Management: No rails, One rail Left, Step to pattern, Backwards, Forwards, With walker, With crutches ?Number of Stairs: 16 ?General stair comments: 2 stairs x 3 bkwd with RW; 5 stairs with bil crutches, 5 stairs with crutch and rail.  Cues for sequence, spouse assisting ? ? ?Wheelchair Mobility ?  ? ?Modified Rankin (Stroke Patients Only) ?  ? ? ?  ?Balance Overall balance assessment: Needs assistance ?Sitting-balance support: No upper extremity supported, Feet supported ?Sitting balance-Leahy Scale: Good ?  ?  ?Standing balance support: No upper extremity supported ?Standing balance-Leahy Scale: Fair ?  ?  ?  ?  ?  ?  ?  ?  ?  ?  ?  ?  ?  ? ?  ?Cognition Arousal/Alertness: Awake/alert ?Behavior During Therapy: [facility] for tasks assessed/performed ?Overall Cognitive Status: Within Functional Limits for tasks assessed ?  ?  ?  ?  ?  ?  ?  ?  ?  ?  ?  ?  ?  ?  ?  ?  ?  ?  ?  ? ?  ?Exercises Total Joint Exercises ?Ankle Circles/Pumps: AROM, Both, 15 reps, Supine ?Quad Sets: AROM, Both, 10 reps, Supine ?Heel Slides: AAROM, Right, 15 reps, Supine ?Straight Leg Raises: AAROM, AROM, Right, 15 reps, Supine ?Long Arc Quad: AAROM, AROM, Right, 10 reps, Seated ? ?  ?  General Comments   ?  ?  ? ?Pertinent Vitals/Pain Pain Assessment ?Pain Assessment: 0-10 ?Pain Score: 5  ?Pain Location: R knee ?Pain Descriptors / Indicators: Aching, Sore ?Pain Intervention(s): Limited activity within patient's tolerance, Monitored during session, Premedicated before session, Ice applied  ? ? ?Home Living Family/patient expects to be discharged to::  Private residence ?Living Arrangements: Spouse/significant other ?Available Help at Discharge: Family ?Type of Home: House ?Home Access: Stairs to enter ?Entrance Stairs-Rails: None;Left ?Entrance Stairs-Number of Steps: 2+6 ?  ?Home Layout: Able to live on main level with bedroom/bathroom ?Home Equipment: Conservation officer, nature (2 wheels);Cane - single point;Crutches;BSC/3in1 ?   ?  ?Prior Function    ?  ?  ?   ? ?PT Goals (current goals can now be found in the care plan section) Acute Rehab PT Goals ?Patient Stated Goal: REgain IND ?PT Goal Formulation: With patient ?Time For Goal Achievement: 06/09/21 ?Potential to Achieve Goals: Good ?Progress towards PT goals: Progressing toward goals ? ?  ?Frequency ? ? ? 7X/week ? ? ? ?  ?PT Plan Current plan remains appropriate  ? ? ?Co-evaluation   ?  ?  ?  ?  ? ?  ?AM-PAC PT "6 Clicks" Mobility   ?Outcome Measure ? Help needed turning from your back to your side while in a flat bed without using bedrails?: A Little ?Help needed moving from lying on your back to sitting on the side of a flat bed without using bedrails?: A Little ?Help needed moving to and from a bed to a chair (including a wheelchair)?: A Little ?Help needed standing up from a chair using your arms (e.g., wheelchair or bedside chair)?: A Little ?Help needed to walk in hospital room?: A Little ?Help needed climbing 3-5 steps with a railing? : A Little ?6 Click Score: 18 ? ?  ?End of Session Equipment Utilized During Treatment: Gait belt ?Activity Tolerance: Patient tolerated treatment well ?Patient left: in bed;with call bell/phone within reach;with family/visitor present ?Nurse Communication: Mobility status ?PT Visit Diagnosis: Difficulty in walking, not elsewhere classified (R26.2) ?  ? ? ?Time: 1105-1200 ?PT Time Calculation (min) (ACUTE ONLY): 55 min ? ?Charges:  $Gait Training: 8-22 mins ?$Therapeutic Exercise: 8-22 mins ?$Therapeutic Activity: 8-22 mins          ?          ? ?Debe Coder PT ?Acute  Rehabilitation Services ?Pager [phone] ?Office [phone] ? ? ? ?Joseph [name] ?06/09/2021, 1:24 PM ? ?

## 2021-06-09 NOTE — Evaluation (Signed)
Physical Therapy Evaluation ?Patient Details ?Name: Joseph [name] ?MRN: [id number] ?DOB: [birth year] ?Today's Date: 06/09/2021 ? ?History of Present Illness ? Pt s/p R TKR and with hx of spinal stenosis, neurogenic claudication and back surgery  ?Clinical Impression ? Pt s/p R TKR and presents with decreased R LE strength/ROM and post op pain limiting functional mobility.  Pt should progress to dc home with family assist and reports first OP PT scheduled for 06/12/21. ?   ? ?Recommendations for follow up therapy are one component of a multi-disciplinary discharge planning process, led by the attending physician.  Recommendations may be updated based on patient status, additional functional criteria and insurance authorization. ? ?Follow Up Recommendations Follow physician's recommendations for discharge plan and follow up therapies ? ?  ?Assistance Recommended at Discharge Intermittent Supervision/Assistance  ?Patient can return home with the following ? A little help with walking and/or transfers;A little help with bathing/dressing/bathroom;Assist for transportation;Help with stairs or ramp for entrance ? ?  ?Equipment Recommendations None recommended by PT  ?Recommendations for Other Services ?    ?  ?Functional Status Assessment Patient has had a recent decline in their functional status and demonstrates the ability to make significant improvements in function in a reasonable and predictable amount of time.  ? ?  ?Precautions / Restrictions Precautions ?Precautions: Knee ?Restrictions ?Weight Bearing Restrictions: No ?Other Position/Activity Restrictions: WBAT  ? ?  ? ?Mobility ? Bed Mobility ?Overal bed mobility: Needs Assistance ?Bed Mobility: Supine to Sit ?  ?  ?Supine to sit: Min guard ?  ?  ?  ?  ? ?Transfers ?Overall transfer level: Needs assistance ?Equipment used: Rolling walker (2 wheels) ?Transfers: Sit to/from Stand ?Sit to Stand: Min assist, From elevated surface ?  ?  ?  ?  ?  ?General transfer  comment: cues for LE management and use of UEs to self assist ?  ? ?Ambulation/Gait ?Ambulation/Gait assistance: Min assist, Min guard ?Gait Distance (Feet): 100 Feet ?Assistive device: Rolling walker (2 wheels) ?Gait Pattern/deviations: Step-to pattern, Decreased step length - right, Decreased step length - left, Shuffle, Trunk flexed ?  ?  ?  ?General Gait Details: cues for posture, position from RW, and sequence ? ?Stairs ?  ?  ?  ?  ?  ? ?Wheelchair Mobility ?  ? ?Modified Rankin (Stroke Patients Only) ?  ? ?  ? ?Balance Overall balance assessment: Needs assistance ?Sitting-balance support: No upper extremity supported, Feet supported ?Sitting balance-Leahy Scale: Good ?  ?  ?Standing balance support: Bilateral upper extremity supported ?Standing balance-Leahy Scale: Poor ?  ?  ?  ?  ?  ?  ?  ?  ?  ?  ?  ?  ?   ? ? ? ?Pertinent Vitals/Pain Pain Assessment ?Pain Assessment: 0-10 ?Pain Score: 5  ?Pain Location: R knee ?Pain Descriptors / Indicators: Aching, Sore ?Pain Intervention(s): Limited activity within patient's tolerance, Monitored during session, Premedicated before session, Ice applied  ? ? ?Home Living Family/patient expects to be discharged to:: Private residence ?Living Arrangements: Spouse/significant other ?Available Help at Discharge: Family ?Type of Home: House ?Home Access: Stairs to enter ?Entrance Stairs-Rails: None;Left ?Entrance Stairs-Number of Steps: 2+6 ?  ?Home Layout: Able to live on main level with bedroom/bathroom ?Home Equipment: Conservation officer, nature (2 wheels);Cane - single point;Crutches;BSC/3in1 ?   ?  ?Prior Function Prior Level of Function : Independent/Modified Independent ?  ?  ?  ?  ?  ?  ?Mobility Comments: Used cane occasionally ?  ?  ? ? ?  Hand Dominance  ?   ? ?  ?Extremity/Trunk Assessment  ? Upper Extremity Assessment ?Upper Extremity Assessment: Overall WFL for tasks assessed ?  ? ?Lower Extremity Assessment ?Lower Extremity Assessment: RLE deficits/detail ?RLE Deficits /  Details: 3/5 quads with IND SLR; AAROM at knee -5 - 100 ?  ? ?   ?Communication  ? Communication: No difficulties  ?Cognition Arousal/Alertness: Awake/alert ?Behavior During Therapy: [facility] for tasks assessed/performed ?Overall Cognitive Status: Within Functional Limits for tasks assessed ?  ?  ?  ?  ?  ?  ?  ?  ?  ?  ?  ?  ?  ?  ?  ?  ?  ?  ?  ? ?  ?General Comments   ? ?  ?Exercises Total Joint Exercises ?Ankle Circles/Pumps: AROM, Both, 15 reps, Supine ?Quad Sets: AROM, Both, 10 reps, Supine ?Heel Slides: AAROM, Right, 15 reps, Supine ?Straight Leg Raises: AAROM, AROM, Right, 15 reps, Supine  ? ?Assessment/Plan  ?  ?PT Assessment Patient needs continued PT services  ?PT Problem List Decreased strength;Decreased range of motion;Decreased activity tolerance;Decreased balance;Decreased mobility;Decreased knowledge of use of DME;Pain ? ?   ?  ?PT Treatment Interventions DME instruction;Gait training;Stair training;Functional mobility training;Therapeutic activities;Therapeutic exercise;Patient/family education   ? ?PT Goals (Current goals can be found in the Care Plan section)  ?Acute Rehab PT Goals ?Patient Stated Goal: REgain IND ?PT Goal Formulation: With patient ?Time For Goal Achievement: 06/09/21 ?Potential to Achieve Goals: Good ? ?  ?Frequency 7X/week ?  ? ? ?Co-evaluation   ?  ?  ?  ?  ? ? ?  ?AM-PAC PT "6 Clicks" Mobility  ?Outcome Measure Help needed turning from your back to your side while in a flat bed without using bedrails?: A Little ?Help needed moving from lying on your back to sitting on the side of a flat bed without using bedrails?: A Little ?Help needed moving to and from a bed to a chair (including a wheelchair)?: A Little ?Help needed standing up from a chair using your arms (e.g., wheelchair or bedside chair)?: A Little ?Help needed to walk in hospital room?: A Little ?Help needed climbing 3-5 steps with a railing? : A Lot ?6 Click Score: 17 ? ?  ?End of Session Equipment Utilized During  Treatment: Gait belt ?Activity Tolerance: Patient tolerated treatment well ?Patient left: in chair;with call bell/phone within reach;with family/visitor present ?Nurse Communication: Mobility status ?PT Visit Diagnosis: Difficulty in walking, not elsewhere classified (R26.2) ?  ? ?Time: 3710-6269 ?PT Time Calculation (min) (ACUTE ONLY): 40 min ? ? ?Charges:   PT Evaluation ?$PT Eval Low Complexity: 1 Low ?PT Treatments ?$Gait Training: 8-22 mins ?$Therapeutic Exercise: 8-22 mins ?  ?   ? ? ?Debe Coder PT ?Acute Rehabilitation Services ?Pager [phone] ?Office [phone] ? ? ?Joseph [name] ?06/09/2021, 1:16 PM ? ?

## 2021-06-09 NOTE — TOC Transition Note (Signed)
Transition of Care (TOC) - CM/SW Discharge Note ? ? ?Patient Details  ?Name: Joseph [name] ?MRN: [id number] ?Date of Birth: 1955-12-11 ? ?Transition of Care (TOC) CM/SW Contact:  ?Ellsie Violette, LCSW ?Phone Number: ?06/09/2021, 9:43 AM ? ? ?Clinical Narrative:    ?Met with pt and confirming he has all needed DME.  Plan for OPPT at [facility].  No TOC needs. ? ? ?Final next level of care: OP Rehab ?Barriers to Discharge: No Barriers Identified ? ? ?Patient Goals and CMS Choice ?Patient states their goals for this hospitalization and ongoing recovery are:: return home ?  ?  ? ?Discharge Placement ?  ?           ?  ?  ?  ?  ? ?Discharge Plan and Services ?  ?  ?           ?DME Arranged: N/A ?DME Agency: NA ?  ?  ?  ?  ?  ?  ?  ?  ? ?Social Determinants of Health (SDOH) Interventions ?  ? ? ?Readmission Risk Interventions ?No flowsheet data found. ? ? ? ? ?

## 2021-06-15 ENCOUNTER — Other Ambulatory Visit: Payer: Self-pay

## 2021-06-15 ENCOUNTER — Other Ambulatory Visit (HOSPITAL_COMMUNITY): Payer: Self-pay | Admitting: Orthopedic Surgery

## 2021-06-15 ENCOUNTER — Encounter (HOSPITAL_COMMUNITY): Payer: Self-pay

## 2021-06-15 ENCOUNTER — Telehealth: Payer: Self-pay | Admitting: Cardiovascular Disease

## 2021-06-15 ENCOUNTER — Ambulatory Visit (HOSPITAL_COMMUNITY)
Admission: RE | Admit: 2021-06-15 | Discharge: 2021-06-15 | Disposition: A | Discharge status: Home / Self Care | Payer: 59 | Source: Ambulatory Visit | Attending: Cardiology | Admitting: Cardiology

## 2021-06-15 DIAGNOSIS — M79604 Pain in right leg: Secondary | ICD-10-CM

## 2021-06-15 NOTE — Telephone Encounter (Signed)
Joseph [name] is calling from [facility] in regards to the patient's Doppler results. He states their surgeon is not available to advise on treating the patient so they are unsure on what to do. He is wanting to know if Dr. Sallyanne Kuster can treat the patient in regards to this and determine if the pt should be put on a blood thinner due to it being questionable if the patient needs it based on the results. He states if not they will send the patient to the ED, but are trying to avoid that if possible. States to request to speak with Dylan when calling back.  ?

## 2021-06-15 NOTE — Progress Notes (Unsigned)
Patient had to wait for a total of one hour while trying to verify information from [place], [place]. From [facility]. I gave Dylan and front office staff my direct cell phone number to give patient instructions for SVT. Two separate attempts to get instructions for patient. Finally, took patient over to [facility] office, where front office staff said they had called an hour ago to release patient. I said they didn't call my number so I['m not sure who they spoke to. ? ? ?Leavy Cella, RDCS ?

## 2021-06-15 NOTE — Telephone Encounter (Signed)
RN  spoke doctor of day  Dr Harriet Masson.    Treatment should be handle by [facility].  Original order  was started by [facility] ? RN spoke to [employer] verbalized understanding ?

## 2021-06-19 ENCOUNTER — Other Ambulatory Visit: Payer: Self-pay

## 2021-06-19 ENCOUNTER — Emergency Department (HOSPITAL_BASED_OUTPATIENT_CLINIC_OR_DEPARTMENT_OTHER): Payer: 59

## 2021-06-19 ENCOUNTER — Emergency Department (HOSPITAL_BASED_OUTPATIENT_CLINIC_OR_DEPARTMENT_OTHER)
Admission: EM | Admit: 2021-06-19 | Discharge: 2021-06-20 | Disposition: A | Discharge status: Home / Self Care | Payer: 59 | Attending: Emergency Medicine | Admitting: Emergency Medicine

## 2021-06-19 ENCOUNTER — Encounter (HOSPITAL_BASED_OUTPATIENT_CLINIC_OR_DEPARTMENT_OTHER): Payer: Self-pay

## 2021-06-19 ENCOUNTER — Emergency Department (HOSPITAL_BASED_OUTPATIENT_CLINIC_OR_DEPARTMENT_OTHER): Payer: 59 | Admitting: Radiology

## 2021-06-19 DIAGNOSIS — Z85820 Personal history of malignant melanoma of skin: Secondary | ICD-10-CM | POA: Insufficient documentation

## 2021-06-19 DIAGNOSIS — J9811 Atelectasis: Secondary | ICD-10-CM | POA: Insufficient documentation

## 2021-06-19 DIAGNOSIS — Z87891 Personal history of nicotine dependence: Secondary | ICD-10-CM | POA: Diagnosis not present

## 2021-06-19 DIAGNOSIS — D649 Anemia, unspecified: Secondary | ICD-10-CM | POA: Insufficient documentation

## 2021-06-19 DIAGNOSIS — E871 Hypo-osmolality and hyponatremia: Secondary | ICD-10-CM | POA: Insufficient documentation

## 2021-06-19 DIAGNOSIS — R0602 Shortness of breath: Secondary | ICD-10-CM

## 2021-06-19 LAB — CBC
HCT: 27.5 % — ABNORMAL LOW (ref 39.0–52.0)
Hemoglobin: 9.1 g/dL — ABNORMAL LOW (ref 13.0–17.0)
MCH: 31.1 pg (ref 26.0–34.0)
MCHC: 33.1 g/dL (ref 30.0–36.0)
MCV: 93.9 fL (ref 80.0–100.0)
Platelets: 721 10*3/uL — ABNORMAL HIGH (ref 150–400)
RBC: 2.93 MIL/uL — ABNORMAL LOW (ref 4.22–5.81)
RDW: 14 % (ref 11.5–15.5)
WBC: 15.3 10*3/uL — ABNORMAL HIGH (ref 4.0–10.5)
nRBC: 0 % (ref 0.0–0.2)

## 2021-06-19 LAB — BASIC METABOLIC PANEL
Anion gap: 10 (ref 5–15)
BUN: 19 mg/dL (ref 8–23)
CO2: 24 mmol/L (ref 22–32)
Calcium: 9.3 mg/dL (ref 8.9–10.3)
Chloride: 94 mmol/L — ABNORMAL LOW (ref 98–111)
Creatinine, Ser: 1.17 mg/dL (ref 0.61–1.24)
GFR, Estimated: >60 mL/min (ref >60)
Glucose, Bld: 122 mg/dL — ABNORMAL HIGH (ref 70–99)
Potassium: 4.3 mmol/L (ref 3.5–5.1)
Sodium: 128 mmol/L — ABNORMAL LOW (ref 135–145)

## 2021-06-19 LAB — TROPONIN I (HIGH SENSITIVITY)
Troponin I (High Sensitivity): 7 ng/L (ref <18)
Troponin I (High Sensitivity): 6 ng/L (ref <18)

## 2021-06-19 MED ORDER — SODIUM CHLORIDE 0.9 % IV BOLUS
1000.0000 mL | Freq: Once | INTRAVENOUS | Status: AC
  Administered 2021-06-19: 1000 mL via INTRAVENOUS

## 2021-06-19 MED ORDER — IOHEXOL 350 MG/ML SOLN
100.0000 mL | Freq: Once | INTRAVENOUS | Status: AC | PRN
  Administered 2021-06-19: 75 mL via INTRAVENOUS

## 2021-06-19 NOTE — ED Triage Notes (Signed)
Pt arrives POV with his wife. ? ?Pt had right knee replacement on 06/08/21 and was diagnosed with DVT on 06/15/21. ? ?He states he has had some mild SHOB but this became worse today. ? ?He is taking Eliquis 5 mg daily. ?

## 2021-06-20 NOTE — ED Provider Notes (Signed)
?Joseph [name] ?[name] ?Provider Note ?MRN:  [id number]  ?Arrival date & time: 06/20/21    ? ?Chief Complaint   ?Shortness of Breath ?  ?History of Present Illness   ?Per Beagley Vastine is a 66 y.o. year-old male with no pertinent past medical history presenting to the ED with chief complaint of shortness of breath. ? ?Recent knee replacement on 9-67 complicated by DVT in the right lower extremity.  Has been started on Eliquis.  Today had an acute episode of shortness of breath when trying to ambulate.  Trouble doing anything today due to dyspnea on exertion. ? ?Review of Systems  ?A thorough review of systems was obtained and all systems are negative except as noted in the HPI and PMH.  ? ?Patient's Health History   ? ?Past Medical History:  ?Diagnosis Date  ? Alopecia   ? DDD (degenerative disc disease)   ? Degenerative scoliosis 07/07/2018  ? Diverticulosis   ? ED (erectile dysfunction)   ? GERD (gastroesophageal reflux disease)   ? History of kidney stones   ? Melanoma ([place])   ? sees Dr. Nevada Crane.  back and chest  ? Neurogenic claudication ([place]) 07/07/2018  ? Spinal stenosis of lumbar region 07/07/2018  ?  ?Past Surgical History:  ?Procedure Laterality Date  ? I & D EXTREMITY Right 01/08/2020  ? Procedure: IRRIGATION AND DEBRIDEMENT RIGHT HAND;  Surgeon: Charlotte Crumb, MD;  Location: [facility];  Service: Orthopedics;  Laterality: Right;  ? KNEE ARTHROPLASTY Right 06/08/2021  ? Procedure: COMPUTER ASSISTED TOTAL KNEE ARTHROPLASTY;  Surgeon: Rod Can, MD;  Location: [facility];  Service: Orthopedics;  Laterality: Right;  ? KNEE ARTHROSCOPY  03/26/2006  ? RIGHT  ? [place] SURGERY  2000  ? MELANOMA EXCISION    ?  ?Family History  ?Problem Relation Age of Onset  ? Transient ischemic attack Father   ? Multiple myeloma Father   ? Bladder Cancer Father   ? Celiac disease Mother   ? Scoliosis Mother   ? Colon cancer Neg Hx   ? Esophageal cancer Neg Hx   ? Liver disease Neg Hx   ?  Kidney disease Neg Hx   ? Colon polyps Neg Hx   ?  ?Social History  ? ?Socioeconomic History  ? Marital status: Married  ?  Spouse name: Judson Roch Stout  ? Number of children: 1  ? Years of education: Not on file  ? Highest education level: Not on file  ?Occupational History  ? Not on file  ?Tobacco Use  ? Smoking status: Former  ?  Packs/day: 1.00  ?  Types: Cigarettes  ?  Quit date: 04/25/1994  ?  Years since quitting: 27.1  ? Smokeless tobacco: Never  ?Vaping Use  ? Vaping Use: Never used  ?Substance and Sexual Activity  ? Alcohol use: Yes  ?  Alcohol/week: 2.0 standard drinks  ?  Types: 2 Cans of beer per week  ?  Comment: 1-2 drink a week  ? Drug use: No  ? Sexual activity: Yes  ?Other Topics Concern  ? Not on file  ?Social History Narrative  ? riight handed  ? Drinks caffeine  ? Split level home  ? ?Social Determinants of Health  ? ?Financial Resource Strain: Not on file  ?Food Insecurity: Not on file  ?Transportation Needs: Not on file  ?Physical Activity: Not on file  ?Stress: Not on file  ?Social Connections: Not on file  ?Intimate Partner Violence: Not on file  ?  ? ?  Physical Exam  ? ?Vitals:  ? 06/20/21 0000 06/20/21 0030  ?BP: (!) 152/95 (!) 162/94  ?Pulse: 86 82  ?Resp: 19 13  ?Temp:    ?SpO2: 100% 100%  ?  ?CONSTITUTIONAL: Well-appearing, NAD ?NEURO/PSYCH:  Alert and oriented x 3, no focal deficits ?EYES:  eyes equal and reactive ?ENT/NECK:  no LAD, no JVD ?CARDIO: Regular rate, well-perfused, normal S1 and S2 ?PULM:  CTAB no wheezing or rhonchi ?GI/GU:  non-distended, non-tender ?MSK/SPINE:  No gross deformities, no edema ?SKIN:  no rash, atraumatic ? ? ?*Additional and/or pertinent findings included in MDM below ? ?Diagnostic and Interventional Summary  ? ? EKG Interpretation ? ?Date/Time:  Monday June 19 2021 17:50:54 EDT ?Ventricular Rate:  85 ?PR Interval:  168 ?QRS Duration: 82 ?QT Interval:  346 ?QTC Calculation: 411 ?R Axis:   62 ?Text Interpretation: Sinus rhythm with Premature atrial complexes  Otherwise normal ECG No previous ECGs available Confirmed by Gerlene Fee [phone]) on 06/19/2021 11:00:59 PM ?  ? ?  ? ?Labs Reviewed  ?CBC - Abnormal; Notable for the following components:  ?    Result Value  ? WBC 15.3 (*)   ? RBC 2.93 (*)   ? Hemoglobin 9.1 (*)   ? HCT 27.5 (*)   ? Platelets 721 (*)   ? All other components within normal limits  ?BASIC METABOLIC PANEL - Abnormal; Notable for the following components:  ? Sodium 128 (*)   ? Chloride 94 (*)   ? Glucose, Bld 122 (*)   ? All other components within normal limits  ?TROPONIN I (HIGH SENSITIVITY)  ?TROPONIN I (HIGH SENSITIVITY)  ?  ?CT Angio Chest PE W and/or Wo Contrast  ?Final Result  ?  ?DG Chest 2 View  ?Final Result  ?  ?  ?Medications  ?iohexol (OMNIPAQUE) 350 MG/ML injection 100 mL (75 mLs Intravenous Contrast Given 06/19/21 2254)  ?sodium chloride 0.9 % bolus 1,000 mL (0 mLs Intravenous Stopped 06/20/21 0052)  ?  ? ?Procedures  /  Critical Care ?Procedures ? ?ED Course and Medical Decision Making  ?Initial Impression and Ddx ?Known DVT, recent orthopedic surgery, with shortness of breath today there is concern for possible PE.  Awaiting labs, CTA.  Also considering ACS, atelectasis, pneumonia, deconditioning ? ?Past medical/surgical history that increases complexity of ED encounter: Recent total knee replacement, recent DVT ? ?Interpretation of Diagnostics ?I personally reviewed the EKG and my interpretation is as follows: Sinus rhythm, frequent PACs ?   ?CBC revealing a leukocytosis of unclear significance, possibly post procedural/surgical.  Patient not having any fever or increased pain or redness of the knee to suggest infection.  Slightly decrease in hemoglobin from prior, patient denies any bleeding or blood in stool or black stool.  Thrombocytosis again likely reactionary after surgery ? ?BMP revealing hyponatremia, patient recently started a low sodium diet and also dehydration may be contributing to this. ? ?Patient Reassessment and  Ultimate Disposition/Management ?Patient continues to look and feel well after IV fluids, appropriate for discharge. ? ?Patient management required discussion with the following services or consulting groups:  None ? ?Complexity of Problems Addressed ?Acute illness or injury that poses threat of life of bodily function ? ?Additional Data Reviewed and Analyzed ?Further history obtained from: ?Further history from spouse/family member and Past medical history and medications listed in the EMR ? ?Additional Factors Impacting ED Encounter Risk ?Consideration of hospitalization ? ?Barth Kirks. Sedonia Small, MD ?[name] ?[email or web address] ? ?Final Clinical  Impressions(s) / ED Diagnoses  ? ?  ICD-10-CM   ?1. Hyponatremia  E87.1   ?  ?2. Anemia, unspecified type  D64.9   ?  ?3. Atelectasis  J98.11   ?  ?4. SOB (shortness of breath)  R06.02   ?  ?  ?ED Discharge Orders   ? ? None  ? ?  ?  ? ?Discharge Instructions Discussed with and Provided to Patient:  ? ? ? ?Discharge Instructions   ? ?  ?You were evaluated in the [name] and after careful evaluation, we did not find any emergent condition requiring admission or further testing in the hospital. ? ?Your exam/testing today was overall reassuring.  We discussed some of the possibilities for your symptoms today, such as the atelectasis, low sodium, low blood levels, poor sleep.  Recommend continued patients in recovery at home, recommend close follow-up with your regular doctors to follow-up on your blood test.  Recommend keeping a close eye out for any bleeding. ? ?Please return to the [name] if you experience any worsening of your condition.  Thank you for allowing [place] to be a part of your care. ? ? ? ? ? ?  ?Maudie Flakes, MD ?06/20/21 [phone] ? ?

## 2021-06-20 NOTE — Discharge Instructions (Signed)
You were evaluated in the Emergency Department and after careful evaluation, we did not find any emergent condition requiring admission or further testing in the hospital. ? ?Your exam/testing today was overall reassuring.  We discussed some of the possibilities for your symptoms today, such as the atelectasis, low sodium, low blood levels, poor sleep.  Recommend continued patients in recovery at home, recommend close follow-up with your regular doctors to follow-up on your blood test.  Recommend keeping a close eye out for any bleeding. ? ?Please return to the Emergency Department if you experience any worsening of your condition.  Thank you for allowing [place] to be a part of your care. ? ?

## 2021-06-27 ENCOUNTER — Encounter: Payer: Self-pay | Admitting: Internal Medicine

## 2021-06-27 ENCOUNTER — Ambulatory Visit: Payer: 59 | Admitting: Internal Medicine

## 2021-06-27 VITALS — BP 118/54 | HR 104 | Ht 75.0 in | Wt 185.0 lb

## 2021-06-27 DIAGNOSIS — I491 Atrial premature depolarization: Secondary | ICD-10-CM

## 2021-06-27 NOTE — Patient Instructions (Addendum)
Medication Instructions:  ?No Changes In Medications at this time.  ?*If you need a refill on your cardiac medications before your next appointment, please call your pharmacy* ? ?Follow-Up: ?At [facility], you and your health needs are our priority.  As part of our continuing mission to provide you with exceptional heart care, we have created designated Provider Care Teams.  These Care Teams include your primary Cardiologist (physician) and Advanced Practice Providers (APPs -  Physician Assistants and Nurse Practitioners) who all work together to provide you with the care you need, when you need it. ? ?Your next appointment:   ?6 month(s) ? ?The format for your next appointment:   ?In Person ? ?Provider:   ?Dr. Harl Bowie  ? ?

## 2021-06-27 NOTE — Progress Notes (Signed)
?Cardiology Consultation:  ? ?[name] ?MRN: [id number]; DOB: 1955-03-29 ? ?Admit date: (Not on file) ?Date of Consult: 06/27/2021 ? ?Primary Care Provider: Denita Lung, MD ?[facility] Cardiologist: Joseph Klein, MD New ?[facility] Electrophysiologist:  None  ? ? ?Patient Profile:  ? ?Joseph [name] is a 66 y.o. male with a hx of HTN, lumbar spine stenosis, otherwise generally excellent health who was being seen for the evaluation of arrhythmia at the request of Joseph [name]. ? ?History of Present Illness:  ? ?Prior HPI ?Joseph [name] was noted to have an irregular rhythm during physical exam with his primary care provider.  An ECG was performed in the computer algorithm diagnosed "atrial fibrillation" and he was therefore referred for evaluation.  However, on my review of the ECG he appears to have sinus rhythm with very frequent premature atrial contractions (5 PACs on the 10-second ECG strip).  Similarly today he has very frequent ectopy on physical exam and his ECG shows premature atrial contractions. He is completely unaware of the irregularity of his heartbeat.  ? ?He is quite active and denies any cardiovascular complaints.The patient specifically denies any chest pain at rest exertion, dyspnea at rest or with exertion, orthopnea, paroxysmal nocturnal dyspnea, syncope, palpitations, focal neurological deficits, intermittent claudication, lower extremity edema, unexplained weight gain, cough, hemoptysis or wheezing.  He is physically active and does not have any limitations to his usual activities from any cardiovascular complaints. ? ?His brother has a history of atrial fibrillation on a background of obesity, alcohol abuse, congestive heart failure.  He has a defibrillator.  There maternal grandfather had congestive heart failure and died in his 107s.  A paternal uncle also has atrial fibrillation, age of onset unclear.  A maternal uncle had a myocardial infarction in his 26s.   Their dad died of lymphoma.  Mom had severe scoliosis and celiac disease.  There is no family history of stroke as far as I [name] tell. ? ?Most of Joseph [name] health problems have been related to degenerative disc disease with lumbar spine stenosis and degenerative scoliosis with secondary neurogenic claudication.  Last significant symptoms are related to GERD and erectile dysfunction.  He had laser ablation of varicose veins of the lower extremities several years ago (left leg worse than right).  He quit smoking about 20 years ago and is a very light consumer of alcohol (at most 1 or 2 beers a week).  He does not drink a lot of coffee or other caffeinated beverages. ? ?A recent lipid profile showed LDL of 96 with excellent HDL cholesterol and a borderline hemoglobin A1c of 5.8%.  He has normal renal function.  He has mild normocytic normochromic anemia, with labs last October that confirm iron deficiency.  This was diagnosed incidentally during a hospitalization for upper extremity cellulitis following an injury.  Iron supplementation was started, but he remains anemic (possibly due to the fact that he is taking a very low-dose of iron 325 mg once daily).  He had a colonoscopy in 2016 with a single sessile polyp without adenomatous or malignant findings. ? ?Interim Hx 03/29/2021: ?Comes in today for a pre-op risk stratification. Cardiac hx mainly notable for PACs. Echo shows normal LV function. 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  He notes fast heart beats in the AM, then it slows down. He noticed it this AM, does not notice it every day. He denies LH or  dizziness. No syncope. He [name] climb one flight of stairs without chest pain or shortness of breath. No hx of significant smoking. ? ?Interim Hx 06/27/2021 ?He was seen for pre-op. He had acceptable cardiac risk. He underwent knee replacement complicated by superficial vein thrombosis and was started on eliquis 5  mg planned for 3 months. He is doing PT. He went to the ED with SOB , had CT PE which was negative for PE. He is doing well on his BB that he was started on for runs of PAT ? ? ?Past Medical History:  ?Diagnosis Date  ? Alopecia   ? DDD (degenerative disc disease)   ? Degenerative scoliosis 07/07/2018  ? Diverticulosis   ? ED (erectile dysfunction)   ? GERD (gastroesophageal reflux disease)   ? History of kidney stones   ? Melanoma ([place])   ? sees Dr. Nevada [name].  back and chest  ? Neurogenic claudication 07/07/2018  ? Spinal stenosis of lumbar region 07/07/2018  ? ? ?Past Surgical History:  ?Procedure Laterality Date  ? I & D EXTREMITY Right 01/08/2020  ? Procedure: IRRIGATION AND DEBRIDEMENT RIGHT HAND;  Surgeon: Joseph Crumb, MD;  Location: [place];  Service: Orthopedics;  Laterality: Right;  ? KNEE ARTHROPLASTY Right 06/08/2021  ? Procedure: COMPUTER ASSISTED TOTAL KNEE ARTHROPLASTY;  Surgeon: Joseph Can, MD;  Location: [facility];  Service: Orthopedics;  Laterality: Right;  ? KNEE ARTHROSCOPY  03/26/2006  ? RIGHT  ? [place] SURGERY  2000  ? MELANOMA EXCISION    ?  ? ?Home Medications:  ?Prior to Admission medications   ?Medication Sig Start Date End Date Taking? Authorizing Provider  ?Ascorbic Acid (VITAMIN C) 1000 MG tablet Take 1,000 mg by mouth daily.   Yes [provider]  ?augmented betamethasone dipropionate (DIPROLENE-AF) 0.05 % cream Apply 1 application topically 2 (two) times daily.  11/13/19  Yes [provider]  ?carisoprodol (SOMA) 350 MG tablet Take 1 tablet (350 mg total) by mouth daily as needed for muscle spasms. 03/14/20  Yes Joseph Lung, MD  ?celecoxib (CELEBREX) 200 MG capsule TAKE 1 CAPSULE BY MOUTH TWICE A DAY ?Patient taking differently: Take 200 mg by mouth 2 (two) times daily. 11/12/19  Yes Joseph Lung, MD  ?clobetasol cream (TEMOVATE) 0.05 %  03/15/20  Yes [provider]  ?cyclobenzaprine (FLEXERIL) 10 MG tablet Take 10 mg by mouth 2 (two) times daily  as needed. 01/10/20  Yes [provider]  ?Echinacea 400 MG CAPS Take 1 capsule by mouth daily.    Yes [provider]  ?ferrous sulfate 325 (65 FE) MG tablet Take 1 tablet (325 mg total) by mouth daily. 01/10/20 05/09/20 Yes Bonnielee Haff, MD  ?finasteride (PROPECIA) 1 MG tablet Take 0.5 mg by mouth daily.    Yes [provider]  ?glucosamine-chondroitin 500-400 MG tablet Take 1 tablet by mouth in the morning and at bedtime.    Yes [provider]  ?Astrid Drafts 1000 MG CAPS Take 1 capsule by mouth daily.    Yes [provider]  ?losartan (COZAAR) 50 MG tablet Take 1 tablet (50 mg total) by mouth daily. 04/15/20  Yes Joseph Lung, MD  ?MELATONIN PO Take 10 mg by mouth at bedtime.    Yes [provider]  ?Multiple Vitamins-Minerals (MULTIVITAMIN WITH MINERALS) tablet Take 1 tablet by mouth daily.   Yes [provider]  ?nortriptyline (PAMELOR) 50 MG capsule TAKE 1 CAPSULE BY MOUTH EVERYDAY AT BEDTIME ?Patient taking differently: Take  50 mg by mouth at bedtime. 11/12/19  Yes Jaffe, Adam R, DO  ?tadalafil (CIALIS) 20 MG tablet TAKE 1 TABLET BY MOUTH EVERY DAY AS NEEDED FOR ERECTILE DYSFUNCTION ?Patient taking differently: Take 20 mg by mouth daily as needed for erectile dysfunction. 08/27/19  Yes Joseph Lung, MD  ?TURMERIC PO Take 1 tablet by mouth daily.    Yes [provider]  ?VITAMIN D PO Take 1 tablet by mouth daily.    Yes [provider]  ?vitamin E 100 UNIT capsule Take 100 Units by mouth daily.    Yes [provider]  ?Zinc 50 MG TABS Take 1 tablet by mouth daily.    Yes [provider]  ?zolpidem (AMBIEN) 10 MG tablet TAKE 1 TABLET BY MOUTH EVERY DAY AT BEDTIME AS NEEDED FOR SLEEP 03/07/20  Yes Joseph Lung, MD  ? ? ? ? ?Allergies:    ?Allergies  ?Allergen Reactions  ? Daypro [Oxaprozin]   ?  GASTRIC PROBLEMS  ? Methadone Itching  ? ? ?Social History:   ?Social History  ? ?Socioeconomic History  ? Marital  status: Married  ?  Spouse name: Judson Roch Reichardt  ? Number of children: 1  ? Years of education: Not on file  ? Highest education level: Not on file  ?Occupational History  ? Not on file  ?Tobacco Use  ? Smokin

## 2021-07-11 ENCOUNTER — Ambulatory Visit: Payer: 59 | Admitting: Family Medicine

## 2021-07-11 ENCOUNTER — Encounter: Payer: Self-pay | Admitting: Family Medicine

## 2021-07-11 VITALS — BP 104/68 | HR 97 | Temp 98.4°F | Wt 184.4 lb

## 2021-07-11 DIAGNOSIS — I82890 Acute embolism and thrombosis of other specified veins: Secondary | ICD-10-CM | POA: Diagnosis not present

## 2021-07-11 NOTE — Progress Notes (Signed)
? ?  Subjective:  ? ? [name], male    DOB: 12/22/1955, 66 y.o.   MRN: [id number] ? ?HPI ?He is here for consult concerning recent knee surgery and subsequent finding of a superficial saphenous vein thrombosis.  He was placed on Eliquis to help with that.  He is now 3 weeks from being placed on the Eliquis and having only minimal discomfort in the right medial superior calf area. ? ? ?Review of Systems ? ?   ?Objective:  ? Physical Exam ?Alert and in no distress.  Bevelyn Buckles' sign is negative.  Minimal tenderness to palpation to the medial calf area. ? ? ? ?   ?Assessment & Plan:  ? ?Superficial vein thrombosis ?I explained that since it is a superficial vein and deep vein, do not feel that it is necessary to continue on Eliquis.  Did recommend heat to the area and Tylenol on an as-needed basis.  He was comfortable with that. ?

## 2021-07-11 NOTE — Patient Instructions (Signed)
Always use Tylenol for pain relief no matter what can pain it is and then if you need more you can go with the Celebrex ?

## 2021-07-23 ENCOUNTER — Other Ambulatory Visit: Payer: Self-pay | Admitting: Family Medicine

## 2021-07-24 ENCOUNTER — Other Ambulatory Visit: Payer: Self-pay | Admitting: Neurology

## 2021-07-24 NOTE — Telephone Encounter (Signed)
[facility] is requesting to fill pt soma and celebrex. Please advise [place] ?

## 2021-08-17 ENCOUNTER — Other Ambulatory Visit: Payer: Self-pay | Admitting: Medical

## 2021-08-31 ENCOUNTER — Encounter: Payer: Self-pay | Admitting: Family Medicine

## 2021-08-31 ENCOUNTER — Ambulatory Visit (INDEPENDENT_AMBULATORY_CARE_PROVIDER_SITE_OTHER): Payer: 59 | Admitting: Family Medicine

## 2021-08-31 VITALS — BP 140/88 | HR 67 | Temp 98.2°F | Ht 75.5 in | Wt 189.2 lb

## 2021-08-31 DIAGNOSIS — M545 Low back pain, unspecified: Secondary | ICD-10-CM

## 2021-08-31 DIAGNOSIS — Z23 Encounter for immunization: Secondary | ICD-10-CM

## 2021-08-31 DIAGNOSIS — Z0000 Encounter for general adult medical examination without abnormal findings: Secondary | ICD-10-CM | POA: Diagnosis not present

## 2021-08-31 DIAGNOSIS — K219 Gastro-esophageal reflux disease without esophagitis: Secondary | ICD-10-CM

## 2021-08-31 DIAGNOSIS — Z125 Encounter for screening for malignant neoplasm of prostate: Secondary | ICD-10-CM

## 2021-08-31 DIAGNOSIS — Z96651 Presence of right artificial knee joint: Secondary | ICD-10-CM

## 2021-08-31 DIAGNOSIS — M5090 Cervical disc disorder, unspecified, unspecified cervical region: Secondary | ICD-10-CM

## 2021-08-31 DIAGNOSIS — N5201 Erectile dysfunction due to arterial insufficiency: Secondary | ICD-10-CM | POA: Diagnosis not present

## 2021-08-31 DIAGNOSIS — I491 Atrial premature depolarization: Secondary | ICD-10-CM

## 2021-08-31 DIAGNOSIS — I10 Essential (primary) hypertension: Secondary | ICD-10-CM | POA: Diagnosis not present

## 2021-08-31 DIAGNOSIS — Z85820 Personal history of malignant melanoma of skin: Secondary | ICD-10-CM

## 2021-08-31 DIAGNOSIS — D509 Iron deficiency anemia, unspecified: Secondary | ICD-10-CM

## 2021-08-31 DIAGNOSIS — G8929 Other chronic pain: Secondary | ICD-10-CM

## 2021-08-31 DIAGNOSIS — M48062 Spinal stenosis, lumbar region with neurogenic claudication: Secondary | ICD-10-CM

## 2021-08-31 LAB — POCT URINALYSIS DIP (PROADVANTAGE DEVICE)
Bilirubin, UA: NEGATIVE
Blood, UA: NEGATIVE
Glucose, UA: NEGATIVE mg/dL
Ketones, POC UA: NEGATIVE mg/dL
Leukocytes, UA: NEGATIVE
Nitrite, UA: NEGATIVE
Protein Ur, POC: NEGATIVE mg/dL
Specific Gravity, Urine: 1.015
Urobilinogen, Ur: 0.2
pH, UA: 6.5 (ref 5.0–8.0)

## 2021-08-31 MED ORDER — LOSARTAN POTASSIUM 50 MG PO TABS: 50.0000 mg | ORAL_TABLET | Freq: Every day | ORAL | 3 refills | Status: DC

## 2021-08-31 MED ORDER — METOPROLOL SUCCINATE ER 25 MG PO TB24: 25.0000 mg | ORAL_TABLET | Freq: Every day | ORAL | 3 refills | Status: DC

## 2021-08-31 MED ORDER — TADALAFIL 20 MG PO TABS: ORAL_TABLET | ORAL | 5 refills | Status: DC

## 2021-08-31 NOTE — Progress Notes (Signed)
Complete physical exam  Patient: Joseph [name]   DOB: 1955/08/18   66 y.o. Male  MRN: [id number]  Subjective:    Chief Complaint  Patient presents with   Annual Exam    Fasting     Joseph [name] is a 66 y.o. male who presents today for a complete physical exam. He reports consuming a general diet.  Staying active QD.   He generally feels fairly well. He reports sleeping well. He had a right TKR in March and is recuperating now.  He does complain of some nocturia with some hesitancy but this does not occur during the day.  He has had cervical disc disease as well as lumbar disc disease.  He is using back braces as well as cervical disc stretching maneuvers to help with these things and seems to be holding his own.  He does use muscle relaxer and pain meds on an as-needed basis.  his reflux is causing very little difficulty.  He would like a refill on his Cialis.  Reflux is causing very little difficulty.  Does have premature atrial contractions and is on beta-blocker for this.  He sees a dermatologist regularly for his melanoma.He does use sleep meds on an as-needed basis.   Most recent fall risk assessment:    05/24/2021    3:26 PM  Hope in the past year? 0  Number falls in past yr: 0  Injury with Fall? 0     Most recent depression screenings:    08/30/2020    3:38 PM 08/27/2019    2:59 PM  PHQ 2/9 Scores  PHQ - 2 Score 0 4  PHQ- 9 Score  14      Patient Active Problem List   Diagnosis Date Noted   Degenerative arthritis of right knee 06/08/2021   Osteoarthritis of right knee 06/08/2021   Hypertension 08/30/2020   Iron deficiency anemia 08/30/2020   Other insomnia 08/30/2020   Cervicogenic headache 08/27/2019   Senile purpura ([place]) 08/27/2019   Male pattern baldness 08/27/2019   Degenerative scoliosis 07/07/2018   Spinal stenosis of lumbar region 07/07/2018   Neurogenic claudication 07/07/2018   Former smoker 09/02/2017   Cervical disc disease  01/20/2015   PAC (premature atrial contraction) 01/17/2015   Varicose veins of both lower extremities 01/17/2015   History of melanoma 01/17/2015   Glucose intolerance (impaired glucose tolerance) 05/24/2014   Chronic back pain 04/26/2011   History of kidney stones 04/26/2011   ED (erectile dysfunction) 04/26/2011   GERD (gastroesophageal reflux disease) 04/26/2011   Past Medical History:  Diagnosis Date   Alopecia    DDD (degenerative disc disease)    Degenerative scoliosis 07/07/2018   Diverticulosis    ED (erectile dysfunction)    GERD (gastroesophageal reflux disease)    History of kidney stones    Melanoma ([place])    sees Dr. Nevada Crane.  back and chest   Neurogenic claudication 07/07/2018   Spinal stenosis of lumbar region 07/07/2018   Past Surgical History:  Procedure Laterality Date   I & D EXTREMITY Right 01/08/2020   Procedure: IRRIGATION AND DEBRIDEMENT RIGHT HAND;  Surgeon: Charlotte Crumb, MD;  Location: [facility];  Service: Orthopedics;  Laterality: Right;   KNEE ARTHROPLASTY Right 06/08/2021   Procedure: COMPUTER ASSISTED TOTAL KNEE ARTHROPLASTY;  Surgeon: Rod Can, MD;  Location: [facility];  Service: Orthopedics;  Laterality: Right;   KNEE ARTHROSCOPY  03/26/2006   RIGHT   LUMBAR [place] SURGERY  2000  MELANOMA EXCISION     Social History   Tobacco Use   Smoking status: Former    Packs/day: 1.00    Types: Cigarettes    Quit date: 04/25/1994    Years since quitting: 27.3   Smokeless tobacco: Never  Vaping Use   Vaping Use: Never used  Substance Use Topics   Alcohol use: Yes    Alcohol/week: 2.0 standard drinks of alcohol    Types: 2 Cans of beer per week    Comment: 1-2 drink a week   Drug use: No   Family History  Problem Relation Age of Onset   Transient ischemic attack Father    Multiple myeloma Father    Bladder Cancer Father    Celiac disease Mother    Scoliosis Mother    Colon cancer Neg Hx    Esophageal cancer Neg Hx    Liver disease Neg Hx     Kidney disease Neg Hx    Colon polyps Neg Hx    Allergies  Allergen Reactions   Daypro [Oxaprozin]     GASTRIC PROBLEMS   Methadone Itching      Patient Care Team: Denita Lung, MD as PCP - General (Family Medicine) Croitoru, Dani Gobble, MD as PCP - Cardiology (Cardiology) Pieter Partridge, DO as Consulting Physician (Neurology)   Outpatient Medications Prior to Visit  Medication Sig   carisoprodol (SOMA) 350 MG tablet TAKE 1 TABLET BY MOUTH DAILY AS NEEDED FOR MUSCLE SPASMS.   celecoxib (CELEBREX) 200 MG capsule TAKE 1 CAPSULE BY MOUTH TWICE A DAY   apixaban (ELIQUIS) 5 MG TABS tablet Eliquis DVT-PE Treatment 30-Day Starter 5 mg (74 tablets) in dose pack  TAKE AS DIRECTED ON PACKAGING   Ascorbic Acid (VITAMIN C) 1000 MG tablet Take 1,000 mg by mouth daily.   BELSOMRA 15 MG TABS TAKE 1 TABLET BY MOUTH AT BEDTIME AS NEEDED.   Camphor-Menthol-Methyl Sal (SALONPAS) 3.03-31-08 % PTCH Apply 1 patch topically daily as needed (pain).   Echinacea 400 MG CAPS Take 400 mg by mouth daily.   ferrous sulfate 325 (65 FE) MG tablet ferrous sulfate 325 mg (65 mg iron) tablet   finasteride (PROPECIA) 1 MG tablet Take 0.25 mg by mouth at bedtime.   losartan (COZAAR) 50 MG tablet TAKE 1 TABLET BY MOUTH EVERY DAY   MELATONIN PO Take 10 mg by mouth at bedtime as needed (sleep).   methocarbamol (ROBAXIN) 500 MG tablet Take 1 tablet (500 mg total) by mouth every 6 (six) hours as needed for muscle spasms. (Patient not taking: Reported on 07/11/2021)   metoprolol succinate (TOPROL-XL) 25 MG 24 hr tablet Take 1 tablet (25 mg total) by mouth daily. Take with or immediately following a meal.   Misc Natural Products (GLUCOSAMINE CHOND CMP TRIPLE) TABS Take 1 tablet by mouth in the morning and at bedtime.   Multiple Vitamins-Minerals (MULTIVITAMIN WITH MINERALS) tablet Take 1 tablet by mouth daily.   nortriptyline (PAMELOR) 50 MG capsule Take 1 capsule (50 mg total) by mouth at bedtime.   ondansetron (ZOFRAN) 4 MG  tablet Take 1 tablet (4 mg total) by mouth every 6 (six) hours as needed for nausea. (Patient not taking: Reported on 07/11/2021)   oxyCODONE (OXY IR/ROXICODONE) 5 MG immediate release tablet Take 1 tablet (5 mg total) by mouth every 4 (four) hours as needed for moderate pain (pain score 4-6). (Patient not taking: Reported on 07/11/2021)   polyethylene glycol (MIRALAX / GLYCOLAX) 17 g packet Take 17 g by mouth  daily as needed for mild constipation. (Patient not taking: Reported on 07/11/2021)   Saw Palmetto 450 MG CAPS Take 450 mg by mouth daily.   senna (SENOKOT) 8.6 MG TABS tablet Take 2 tablets (17.2 mg total) by mouth at bedtime.   tadalafil (CIALIS) 20 MG tablet TAKE 1 TABLET BY MOUTH EVERY DAY AS NEEDED FOR ERECTILE DYSFUNCTION   TERBINAFINE HCL EX 1,000 mg.   Turmeric 500 MG CAPS Take 500 mg by mouth daily.   VITAMIN D PO Take 5,000 Units by mouth daily.   vitamin E 180 MG (400 UNITS) capsule Take 400 Units by mouth daily.   Zinc 50 MG TABS Take 50 mg by mouth daily.   [DISCONTINUED] Krill Oil 1000 MG CAPS Take 1,000 mg by mouth daily.   No facility-administered medications prior to visit.    Review of Systems  All other systems reviewed and are negative.         Objective:  Alert and in no distress. Tympanic membranes and canals are normal. Pharyngeal area is normal. Neck is supple without adenopathy or thyromegaly. Cardiac exam shows a regular sinus rhythm without murmurs or gallops. Lungs are clear to auscultation.    There were no vitals taken for this visit. BP Readings from Last 3 Encounters:  07/11/21 104/68  06/27/21 (!) 118/54  06/20/21 (!) 162/94      Physical Exam   No results found for any visits on 08/31/21. Last CBC Lab Results  Component Value Date   WBC 15.3 (H) 06/19/2021   HGB 9.1 (L) 06/19/2021   HCT 27.5 (L) 06/19/2021   MCV 93.9 06/19/2021   MCH 31.1 06/19/2021   RDW 14.0 06/19/2021   PLT 721 (H) 84/53/6468   Last metabolic panel Lab Results   Component Value Date   GLUCOSE 122 (H) 06/19/2021   NA 128 (L) 06/19/2021   K 4.3 06/19/2021   CL 94 (L) 06/19/2021   CO2 24 06/19/2021   BUN 19 06/19/2021   CREATININE 1.17 06/19/2021   GFRNONAA >60 06/19/2021   CALCIUM 9.3 06/19/2021   PROT 6.7 08/30/2020   ALBUMIN 4.2 08/30/2020   LABGLOB 2.5 08/30/2020   AGRATIO 1.7 08/30/2020   BILITOT 0.3 08/30/2020   ALKPHOS 111 08/30/2020   AST 25 08/30/2020   ALT 23 08/30/2020   ANIONGAP 10 06/19/2021   Last lipids Lab Results  Component Value Date   CHOL 199 08/30/2020   HDL 54 08/30/2020   LDLCALC 132 (H) 08/30/2020   TRIG 74 08/30/2020   CHOLHDL 3.7 08/30/2020        Assessment & Plan:   Routine general medical examination at a health care facility - Plan: CBC with Differential/Platelet, Comprehensive metabolic panel, Lipid panel  Erectile dysfunction due to arterial insufficiency - Plan: POCT Urinalysis DIP (Proadvantage Device), tadalafil (CIALIS) 20 MG tablet  Gastroesophageal reflux disease without esophagitis - Plan: tadalafil (CIALIS) 20 MG tablet  Status post total right knee replacement  Hypertension, unspecified type - Plan: CBC with Differential/Platelet, Comprehensive metabolic panel, losartan (COZAAR) 50 MG tablet, metoprolol succinate (TOPROL-XL) 25 MG 24 hr tablet  PAC (premature atrial contraction) - Plan: metoprolol succinate (TOPROL-XL) 25 MG 24 hr tablet  History of melanoma  Iron deficiency anemia, unspecified iron deficiency anemia type - Plan: CBC with Differential/Platelet  Chronic bilateral low back pain without sciatica  Spinal stenosis of lumbar region with neurogenic claudication  Cervical disc disease  Need for pneumococcal vaccination - Plan: Pneumococcal polysaccharide vaccine 23-valent greater than or equal  to 2yo subcutaneous/IM  Screening for prostate cancer - Plan: PSA    Immunization History  Administered Date(s) Administered   Fluad Quad(high Dose 65+) 01/10/2020    Influenza Split 04/26/2011   Influenza,inj,Quad PF,6+ Mos 02/12/2013, 01/17/2015, 03/20/2017   PFIZER Comirnaty(Gray Top)Covid-19 Tri-Sucrose Vaccine 08/30/2020   PFIZER(Purple Top)SARS-COV-2 Vaccination 04/20/2019, 05/11/2019, 12/09/2019   Pneumococcal Conjugate-13 08/30/2020   Td 11/02/2015   Tdap 03/26/2005   Zoster Recombinat (Shingrix) 08/27/2019, 12/01/2019   Zoster, Live 01/17/2015    Health Maintenance  Topic Date Due   COVID-19 Vaccine (5 - Booster for Pfizer series) 10/25/2020   Pneumonia Vaccine 102+ Years old (2 - PPSV23 if available, else PCV20) 08/30/2021   INFLUENZA VACCINE  10/24/2021   COLONOSCOPY (Pts 45-67yr Insurance coverage will need to be confirmed)  01/17/2025   TETANUS/TDAP  11/01/2025   Hepatitis C Screening  Completed   Zoster Vaccines- Shingrix  Completed   HPV VACCINES  Aged Out   He can will continue follow-up with Dr. RNelva Bushconcerning his neck and back.  Follow-up with orthopedics concerning the knee.  His medications were renewed.  Encouraged him to continue with the rehab for his knee. Discussed health benefits of physical activity, and encouraged him to engage in regular exercise appropriate for his age and condition. Continue dermatology on a yearly basis      KElyse Jarvis RUtah

## 2021-09-01 ENCOUNTER — Telehealth: Payer: Self-pay

## 2021-09-01 LAB — COMPREHENSIVE METABOLIC PANEL
ALT: 23 IU/L (ref 0–44)
AST: 28 IU/L (ref 0–40)
Albumin/Globulin Ratio: 2 (ref 1.2–2.2)
Albumin: 4.4 g/dL (ref 3.8–4.8)
Alkaline Phosphatase: 94 IU/L (ref 44–121)
BUN/Creatinine Ratio: 11 (ref 10–24)
BUN: 11 mg/dL (ref 8–27)
Bilirubin Total: 0.2 mg/dL (ref 0.0–1.2)
CO2: 22 mmol/L (ref 20–29)
Calcium: 9.2 mg/dL (ref 8.6–10.2)
Chloride: 100 mmol/L (ref 96–106)
Creatinine, Ser: 0.98 mg/dL (ref 0.76–1.27)
Globulin, Total: 2.2 g/dL (ref 1.5–4.5)
Glucose: 93 mg/dL (ref 70–99)
Potassium: 4.4 mmol/L (ref 3.5–5.2)
Sodium: 135 mmol/L (ref 134–144)
Total Protein: 6.6 g/dL (ref 6.0–8.5)
eGFR: 85 mL/min/{1.73_m2} (ref >59)

## 2021-09-01 LAB — CBC WITH DIFFERENTIAL/PLATELET
Basophils Absolute: 0 10*3/uL (ref 0.0–0.2)
Basos: 1 %
EOS (ABSOLUTE): 0.2 10*3/uL (ref 0.0–0.4)
Eos: 3 %
Hematocrit: 35.3 % — ABNORMAL LOW (ref 37.5–51.0)
Hemoglobin: 11.7 g/dL — ABNORMAL LOW (ref 13.0–17.7)
Immature Grans (Abs): 0 10*3/uL (ref 0.0–0.1)
Immature Granulocytes: 0 %
Lymphocytes Absolute: 1.4 10*3/uL (ref 0.7–3.1)
Lymphs: 26 %
MCH: 29.4 pg (ref 26.6–33.0)
MCHC: 33.1 g/dL (ref 31.5–35.7)
MCV: 89 fL (ref 79–97)
Monocytes Absolute: 0.8 10*3/uL (ref 0.1–0.9)
Monocytes: 15 %
Neutrophils Absolute: 3.1 10*3/uL (ref 1.4–7.0)
Neutrophils: 55 %
Platelets: 313 10*3/uL (ref 150–450)
RBC: 3.98 x10E6/uL — ABNORMAL LOW (ref 4.14–5.80)
RDW: 12.6 % (ref 11.6–15.4)
WBC: 5.5 10*3/uL (ref 3.4–10.8)

## 2021-09-01 LAB — LIPID PANEL
Chol/HDL Ratio: 3.4 ratio (ref 0.0–5.0)
Cholesterol, Total: 185 mg/dL (ref 100–199)
HDL: 54 mg/dL (ref >39)
LDL Chol Calc (NIH): 116 mg/dL — ABNORMAL HIGH (ref 0–99)
Triglycerides: 84 mg/dL (ref 0–149)
VLDL Cholesterol Cal: 15 mg/dL (ref 5–40)

## 2021-09-01 LAB — PSA: Prostate Specific Ag, Serum: 14.7 ng/mL — ABNORMAL HIGH (ref 0.0–4.0)

## 2021-09-01 NOTE — Telephone Encounter (Signed)
Have him come in for repeat PSA I will put in a future order for a week or 2  Lvm for pt to call back and make appointment on nurse schedule.

## 2021-09-01 NOTE — Addendum Note (Signed)
Addended by: Denita Lung on: 09/01/2021 08:05 AM   Modules accepted: Orders

## 2021-09-08 ENCOUNTER — Other Ambulatory Visit: Payer: Self-pay

## 2021-09-08 ENCOUNTER — Other Ambulatory Visit: Payer: 59

## 2021-09-08 DIAGNOSIS — Z125 Encounter for screening for malignant neoplasm of prostate: Secondary | ICD-10-CM

## 2021-09-08 DIAGNOSIS — R972 Elevated prostate specific antigen [PSA]: Secondary | ICD-10-CM

## 2021-09-09 LAB — PSA, TOTAL AND FREE
PSA, Free Pct: 5.8 %
PSA, Free: 0.71 ng/mL
Prostate Specific Ag, Serum: 12.3 ng/mL — ABNORMAL HIGH (ref 0.0–4.0)

## 2021-09-10 NOTE — Progress Notes (Signed)
urology

## 2021-09-13 ENCOUNTER — Other Ambulatory Visit: Payer: Self-pay | Admitting: Medical

## 2021-09-28 ENCOUNTER — Other Ambulatory Visit: Payer: Self-pay | Admitting: Neurology

## 2021-10-28 ENCOUNTER — Other Ambulatory Visit: Payer: Self-pay | Admitting: Family Medicine

## 2021-10-30 NOTE — Telephone Encounter (Signed)
Refill request for Celebrex

## 2021-10-31 ENCOUNTER — Other Ambulatory Visit: Payer: Self-pay | Admitting: Family Medicine

## 2021-10-31 ENCOUNTER — Other Ambulatory Visit: Payer: Self-pay | Admitting: Neurology

## 2021-11-01 NOTE — Telephone Encounter (Signed)
[facility] to [facility]. Please advised. [place]

## 2021-11-23 NOTE — Progress Notes (Signed)
NEUROLOGY FOLLOW UP OFFICE NOTE  Joseph [name] [id number]  Assessment/Plan:   1.  Cervicogenic headache 2.  Cervical spinal stenosis with degenerative disc disease and spondylosis    1.  nortriptyline 28m at bedtime 2.  Follow up in one year   Subjective:  Joseph Sherrowis a 66year old right-handed man who follows up for cervicogenic headache with cervical spondylosis.   UPDATE: Due to reported elevated blood pressure, last visit we decreased nortriptyline from 775mto 504mt bedtime.  Overall headaches are okay.  They may be triggered by his neck posture working at the computer.   They occur no more than 2 days a week.  Treats with Tylenol and may take Soma later in the day.  Also, he hasn't had the arm twitching or lip smacking.       HISTORY: In August 2016, he began experiencing bilateral posterior neck pain radiating up to the upper cervical region and down to between the shoulder blades.  He describes it as an aching pain.  About 3 days a week, it is accompanied by a headache in a band-like distribution.  He also reports burning sensation at the C7 spinal process.  He reports that a week prior to onset, he performed manual labor in the yard, such as chopping wood and pushing a wheel barrel of dirt.  He has undergone 5 weeks of PT with moderate relief.  The neck pain seems a little worse after a session.  Sometimes he notes numbness in the left arm and hand.  Plain films of cervical spine demonstrated multilevel degenerative disc disease with multiple osseous neural foraminal narrowing, most pronounced at C4-5, C5-6 and C6-7.  Craniocervical junctuion was unremarkable.  MRI of cervical spine from 01/14/15.  It reveals multilevel spondylosis moderate left C3-4 foraminal stenosis, moderate to severe bilateral C5-6 foraminal narrowing and severe left and moderate right C6-7 foraminal stenosis.  There is mild central stenosis with moderate left central stenosis at C6-7. Repeat MRI of  cervical spine from 06/12/2018 showed progressed multilevel degenerative changes with neural foraminal stenosis as well as moderate canal stenosis at C3-4.  He was evaluated by spine surgeon who sent him to Dr. BarBrien Few pain management.  He received an epidural which was helpful.   In [place] 2021, he reported very brief disorientation while driving.  At an intersection, it may take a couple of seconds to get his bearings.  It really only occurs at night, not during the day.  Also, he may sometimes have word-finding difficulty or can't recall somebody's name right away. B12 and TSH from March 2021 were 441 and 1.20 respectively.    PAST MEDICAL HISTORY: Past Medical History:  Diagnosis Date   Alopecia    DDD (degenerative disc disease)    Degenerative scoliosis 07/07/2018   Diverticulosis    ED (erectile dysfunction)    GERD (gastroesophageal reflux disease)    History of kidney stones    [place] ([facility]  sees Dr. Hal[place]ada Craneback and chest   Neurogenic claudication 07/07/2018   Spinal stenosis of lumbar region 07/07/2018    MEDICATIONS: Current Outpatient Medications on File Prior to Visit  Medication Sig Dispense Refill   apixaban (ELIQUIS) 5 MG TABS tablet Eliquis DVT-PE Treatment 30-Day Starter 5 mg (74 tablets) in dose pack  TAKE AS DIRECTED ON PACKAGING (Patient not taking: Reported on 08/31/2021)     Ascorbic Acid (VITAMIN C) 1000 MG tablet Take 1,000 mg by mouth daily. (Patient not taking:  Reported on 08/31/2021)     BELSOMRA 15 MG TABS TAKE 1 TABLET BY MOUTH EVERY DAY AT BEDTIME AS NEEDED 30 tablet 1   Camphor-Menthol-Methyl Sal (SALONPAS) 3.03-31-08 % PTCH Apply 1 patch topically daily as needed (pain).     carisoprodol (SOMA) 350 MG tablet TAKE 1 TABLET BY MOUTH DAILY AS NEEDED FOR MUSCLE SPASMS. 30 tablet 2   celecoxib (CELEBREX) 200 MG capsule TAKE 1 CAPSULE BY MOUTH TWICE A DAY 60 capsule 2   Echinacea 400 MG CAPS Take 400 mg by mouth daily. (Patient not taking: Reported on 08/31/2021)      ferrous sulfate 325 (65 FE) MG tablet ferrous sulfate 325 mg (65 mg iron) tablet     finasteride (PROPECIA) 1 MG tablet Take 0.25 mg by mouth at bedtime.     losartan (COZAAR) 50 MG tablet Take 1 tablet (50 mg total) by mouth daily. 90 tablet 3   MELATONIN PO Take 10 mg by mouth at bedtime as needed (sleep).     methocarbamol (ROBAXIN) 500 MG tablet Take 1 tablet (500 mg total) by mouth every 6 (six) hours as needed for muscle spasms. (Patient not taking: Reported on 07/11/2021) 30 tablet 0   metoprolol succinate (TOPROL-XL) 25 MG 24 hr tablet Take 1 tablet (25 mg total) by mouth daily. Take with or immediately following a meal. 30 tablet 3   Misc Natural Products ([employer] PROSTATE MAX + PO) Take by mouth.     Misc Natural Products (GLUCOSAMINE CHOND CMP TRIPLE) TABS Take 1 tablet by mouth in the morning and at bedtime.     Multiple Vitamins-Minerals (MULTIVITAMIN WITH MINERALS) tablet Take 1 tablet by mouth daily.     nortriptyline (PAMELOR) 50 MG capsule Take 1 capsule (50 mg total) by mouth at bedtime. 30 capsule 5   Omega-3 Fatty Acids (FISH OIL PO) Take by mouth.     ondansetron (ZOFRAN) 4 MG tablet Take 1 tablet (4 mg total) by mouth every 6 (six) hours as needed for nausea. (Patient not taking: Reported on 07/11/2021) 20 tablet 0   oxyCODONE (OXY IR/ROXICODONE) 5 MG immediate release tablet Take 1 tablet (5 mg total) by mouth every 4 (four) hours as needed for moderate pain (pain score 4-6). (Patient not taking: Reported on 07/11/2021) 40 tablet 0   polyethylene glycol (MIRALAX / GLYCOLAX) 17 g packet Take 17 g by mouth daily as needed for mild constipation. (Patient not taking: Reported on 07/11/2021) 14 each 0   Saw Palmetto 450 MG CAPS Take 450 mg by mouth daily. (Patient not taking: Reported on 08/31/2021)     senna (SENOKOT) 8.6 MG TABS tablet Take 2 tablets (17.2 mg total) by mouth at bedtime. (Patient not taking: Reported on 08/31/2021) 60 tablet 0   tadalafil (CIALIS) 20 MG tablet TAKE 1  TABLET BY MOUTH EVERY DAY AS NEEDED FOR ERECTILE DYSFUNCTION 30 tablet 5   TERBINAFINE HCL EX 1,000 mg.     Turmeric 500 MG CAPS Take 500 mg by mouth daily.     VITAMIN D PO Take 5,000 Units by mouth daily.     vitamin E 180 MG (400 UNITS) capsule Take 400 Units by mouth daily.     Zinc 50 MG TABS Take 50 mg by mouth daily.     No current facility-administered medications on file prior to visit.    ALLERGIES: Allergies  Allergen Reactions   Daypro [Oxaprozin]     GASTRIC PROBLEMS   Methadone Itching    FAMILY HISTORY: Family  History  Problem Relation Age of Onset   Transient ischemic attack Father    Multiple myeloma Father    Bladder Cancer Father    Celiac disease Mother    Scoliosis Mother    Colon cancer Neg Hx    Esophageal cancer Neg Hx    Liver disease Neg Hx    Kidney disease Neg Hx    Colon polyps Neg Hx       Objective:  Blood pressure (!) 137/91, pulse 95, height 6' 4"  (1.93 m), weight 190 lb 9.6 oz (86.5 kg), SpO2 97 %. General: No acute distress.  Patient appears well-groomed.   Head:  Normocephalic/atraumatic Eyes:  Fundi examined but not visualized Neurological Exam: alert and oriented to person, place, and time.  Speech fluent and not dysarthric, language intact.  CN II-XII intact. Bulk and tone normal, muscle strength 5/5 throughout.  Sensation to light touch intact.  Deep tendon reflexes 2+ throughout, toes downgoing.  Finger to nose testing intact.  Gait normal, Romberg negative.   Metta Clines, DO  CC: Jill Alexanders, MD

## 2021-11-24 ENCOUNTER — Encounter: Payer: Self-pay | Admitting: Neurology

## 2021-11-24 ENCOUNTER — Ambulatory Visit (INDEPENDENT_AMBULATORY_CARE_PROVIDER_SITE_OTHER): Payer: 59 | Admitting: Neurology

## 2021-11-24 VITALS — BP 137/91 | HR 95 | Ht 76.0 in | Wt 190.6 lb

## 2021-11-24 DIAGNOSIS — G4486 Cervicogenic headache: Secondary | ICD-10-CM | POA: Diagnosis not present

## 2021-11-24 DIAGNOSIS — M47812 Spondylosis without myelopathy or radiculopathy, cervical region: Secondary | ICD-10-CM | POA: Diagnosis not present

## 2021-11-24 MED ORDER — NORTRIPTYLINE HCL 50 MG PO CAPS: 50.0000 mg | ORAL_CAPSULE | Freq: Every day | ORAL | 3 refills | Status: DC

## 2021-11-29 ENCOUNTER — Encounter: Payer: Self-pay | Admitting: Internal Medicine

## 2021-12-12 ENCOUNTER — Other Ambulatory Visit: Payer: Self-pay | Admitting: Family Medicine

## 2021-12-12 DIAGNOSIS — I10 Essential (primary) hypertension: Secondary | ICD-10-CM

## 2021-12-12 DIAGNOSIS — I491 Atrial premature depolarization: Secondary | ICD-10-CM

## 2022-01-02 ENCOUNTER — Encounter: Payer: Self-pay | Admitting: Internal Medicine

## 2022-01-09 ENCOUNTER — Other Ambulatory Visit: Payer: Self-pay | Admitting: Neurology

## 2022-01-09 ENCOUNTER — Other Ambulatory Visit: Payer: Self-pay | Admitting: Family Medicine

## 2022-01-10 NOTE — Telephone Encounter (Signed)
Refill request last apt 08/31/21 next apt 09/03/22

## 2022-01-15 ENCOUNTER — Encounter: Payer: Self-pay | Admitting: Internal Medicine

## 2022-01-19 ENCOUNTER — Encounter: Payer: Self-pay | Admitting: Family Medicine

## 2022-01-19 ENCOUNTER — Ambulatory Visit (INDEPENDENT_AMBULATORY_CARE_PROVIDER_SITE_OTHER): Payer: 59 | Admitting: Family Medicine

## 2022-01-19 ENCOUNTER — Ambulatory Visit
Admission: RE | Admit: 2022-01-19 | Discharge: 2022-01-19 | Disposition: A | Discharge status: Home / Self Care | Payer: 59 | Source: Ambulatory Visit | Attending: Family Medicine | Admitting: Family Medicine

## 2022-01-19 VITALS — BP 152/100 | HR 94 | Temp 98.4°F | Wt 191.6 lb

## 2022-01-19 DIAGNOSIS — L03116 Cellulitis of left lower limb: Secondary | ICD-10-CM

## 2022-01-19 DIAGNOSIS — R0689 Other abnormalities of breathing: Secondary | ICD-10-CM

## 2022-01-19 DIAGNOSIS — I491 Atrial premature depolarization: Secondary | ICD-10-CM | POA: Diagnosis not present

## 2022-01-19 DIAGNOSIS — R972 Elevated prostate specific antigen [PSA]: Secondary | ICD-10-CM | POA: Diagnosis not present

## 2022-01-19 DIAGNOSIS — R9389 Abnormal findings on diagnostic imaging of other specified body structures: Secondary | ICD-10-CM

## 2022-01-19 MED ORDER — DOXYCYCLINE HYCLATE 100 MG PO TABS: 100.0000 mg | ORAL_TABLET | Freq: Two times a day (BID) | ORAL | 0 refills | Status: DC

## 2022-01-19 NOTE — Progress Notes (Signed)
   Subjective:    [name], male    DOB: [birth year], 66 y.o.   MRN: [id number]  HPI He states that 1 month ago he noted a lesion on the left foot that subsequently started draining purulent material.  This tended to clear up but he continues have difficulty with swelling, slight discoloration and only minimal tenderness.  He also complains of a several month history of coughing and some slight shortness of breath that this tends occur mainly at night.  No fever, chills, dyspnea on exertion.  He also has questions about his elevated PSA.  He is scheduled for biopsy on this but he is wondering whether he can get this done quicker.   Review of Systems     Objective:   Physical Exam Alert and in no distress.  Cardiac exam does show an irregular rhythm. Lungs show decreased breath sounds on the right.  Exam of the left foot does show some swelling, brownish-purple discoloration over the dorsum of the foot however is not hot and only minimally tender.      Assessment & Plan:  Cellulitis of left lower extremity - Plan: doxycycline (VIBRA-TABS) 100 MG tablet  Decreased breath sounds in middle field on right side of chest - Plan: DG Chest 2 View  Elevated PSA  PAC (premature atrial contraction) He is to return in 2 weeks for recheck on the foot.  Recommend he take a picture of this so we can look at this again in the future. We will follow-up on the decreased breath sounds after the x-ray. I then discussed the elevated PSA.  We did go over his previous readings and the fact that it was a precipitous rise from the previous 2019 reading of 1.8.  Discussed the fact that prostate cancer is very slow-growing and the decision to even check with the PSA is really a shared decision.  Discussed possible referral to a different urologist but explained that that would not guarantee that this would be to get done any quicker.

## 2022-01-22 NOTE — Addendum Note (Signed)
Addended by: Denita Lung on: 01/22/2022 05:12 PM   Modules accepted: Orders

## 2022-01-31 ENCOUNTER — Ambulatory Visit (INDEPENDENT_AMBULATORY_CARE_PROVIDER_SITE_OTHER): Payer: 59 | Admitting: Family Medicine

## 2022-01-31 VITALS — BP 120/78 | HR 107 | Wt 190.8 lb

## 2022-01-31 DIAGNOSIS — I491 Atrial premature depolarization: Secondary | ICD-10-CM

## 2022-01-31 DIAGNOSIS — L03116 Cellulitis of left lower limb: Secondary | ICD-10-CM

## 2022-01-31 MED ORDER — DOXYCYCLINE HYCLATE 100 MG PO TABS: 100.0000 mg | ORAL_TABLET | Freq: Two times a day (BID) | ORAL | 0 refills | Status: DC

## 2022-01-31 NOTE — Progress Notes (Signed)
   Subjective:    [name], male    DOB: 03-29-1955, 66 y.o.   MRN: [id number]  HPI He is here for consult concerning his cellulitis.  He thinks that he is maybe 60% better on the present antibiotic.  He also had a recent x-ray and evaluation by the radiologist compared to the CT scan showed possible ascending aortic aneurysm of 4.3.  Follow-up was recommended.  He has had an ultrasound for abdominal aortic aneurysm which was negative.  He also has a history of PACs and presently is on metoprolol but states that he really rarely has symptomatic PACs.   Review of Systems     Objective:   Physical Exam Alert and in no distress.  Exam of the lower leg does show slight improvement in the erythema and edema.       Assessment & Plan:  Cellulitis of left lower extremity - Plan: doxycycline (VIBRA-TABS) 100 MG tablet  Premature atrial contractions I will give him another 2 weeks of doxycycline since it seems to help and he will keep me informed as to how he is doing. I then discussed the PACs that he is essentially asymptomatic with.  I told him that I did send a message to his cardiologist concerning proper follow-up for the possible ascending aortic aneurysm.  Discussed treatment of PACs with him and the fact that they are benign.  I will wait to hear from Dr. Harl Bowie.

## 2022-02-06 ENCOUNTER — Other Ambulatory Visit: Payer: Self-pay | Admitting: Family Medicine

## 2022-02-06 ENCOUNTER — Other Ambulatory Visit: Payer: Self-pay | Admitting: Neurology

## 2022-02-07 NOTE — Telephone Encounter (Signed)
Is this okay to refill? 

## 2022-02-11 ENCOUNTER — Other Ambulatory Visit: Payer: Self-pay | Admitting: Family Medicine

## 2022-02-12 NOTE — Telephone Encounter (Signed)
Is this okay to refill? 

## 2022-02-21 ENCOUNTER — Other Ambulatory Visit: Payer: 59

## 2022-03-20 ENCOUNTER — Other Ambulatory Visit: Payer: Self-pay | Admitting: Medical

## 2022-03-22 ENCOUNTER — Encounter: Payer: Self-pay | Admitting: Family Medicine

## 2022-03-22 ENCOUNTER — Telehealth (INDEPENDENT_AMBULATORY_CARE_PROVIDER_SITE_OTHER): Payer: 59 | Admitting: Family Medicine

## 2022-03-22 VITALS — Temp 97.8°F | Ht 75.0 in | Wt 190.0 lb

## 2022-03-22 DIAGNOSIS — J0100 Acute maxillary sinusitis, unspecified: Secondary | ICD-10-CM

## 2022-03-22 MED ORDER — AMOXICILLIN-POT CLAVULANATE 875-125 MG PO TABS: 1.0000 | ORAL_TABLET | Freq: Two times a day (BID) | ORAL | 0 refills | Status: DC
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
## 2022-03-22 NOTE — Patient Instructions (Signed)
Stay well hydrated. Stop all the forms of Coricidin HBP. Instead take Mucinex DM 12 hour tablets twice daily. This contains the expectorant guaifenesin, and a cough suppressant. You may use an antihistamine such as claritin or benadryl if needed (benadryl at night if needed for sleep). This might help for runny nose, sneezing. Do not use any other over-the-counter medications. You may use tylenol if needed for headache, fever or pain. Take the antibiotic twice daily for 10 days. You may use sinus rinses if needed for sinus pain/pressure (neti-pot, or sinus rinse kit, or navage).  Use it once or twice daily.  Use distilled or boiled water, not tap water.  Contact us if your symptoms aren't improving, if you have persistent fever, worsening cough, shortness of breath, chest pain, pain with breathing.

## 2022-04-20 ENCOUNTER — Other Ambulatory Visit: Payer: Self-pay | Admitting: Family Medicine

## 2022-04-20 ENCOUNTER — Other Ambulatory Visit: Payer: Self-pay | Admitting: Neurology

## 2022-04-29 ENCOUNTER — Other Ambulatory Visit: Payer: Self-pay | Admitting: Family Medicine

## 2022-04-29 DIAGNOSIS — I10 Essential (primary) hypertension: Secondary | ICD-10-CM

## 2022-04-29 DIAGNOSIS — I491 Atrial premature depolarization: Secondary | ICD-10-CM

## 2022-06-01 ENCOUNTER — Other Ambulatory Visit: Payer: Self-pay | Admitting: Family Medicine

## 2022-06-01 NOTE — Telephone Encounter (Signed)
Refill request last apt 01/26/22 next apt 09/05/22

## 2022-06-09 IMAGING — US US ABDOMINAL AORTA SCREENING AAA
1 series · 9 of 9 positions shown · non-contrast
Comparison: None.

CLINICAL DATA: Male between 65-75 years of age with a smoking
history.

EXAM:
US ABDOMINAL AORTA MEDICARE SCREENING
TECHNIQUE: Ultrasound examination of the abdominal aorta was performed as a
screening evaluation for abdominal aortic aneurysm.

[Series 1: us abdominal aorta screening aaa · 0.25mm/px · 9 of 9 slices shown]
[im 1/9]
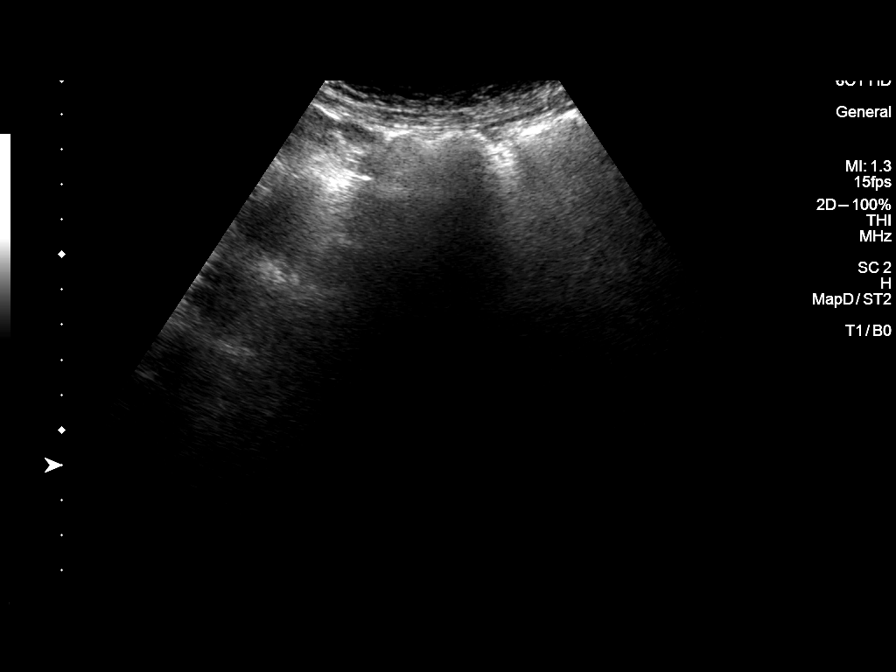
[im 2/9]
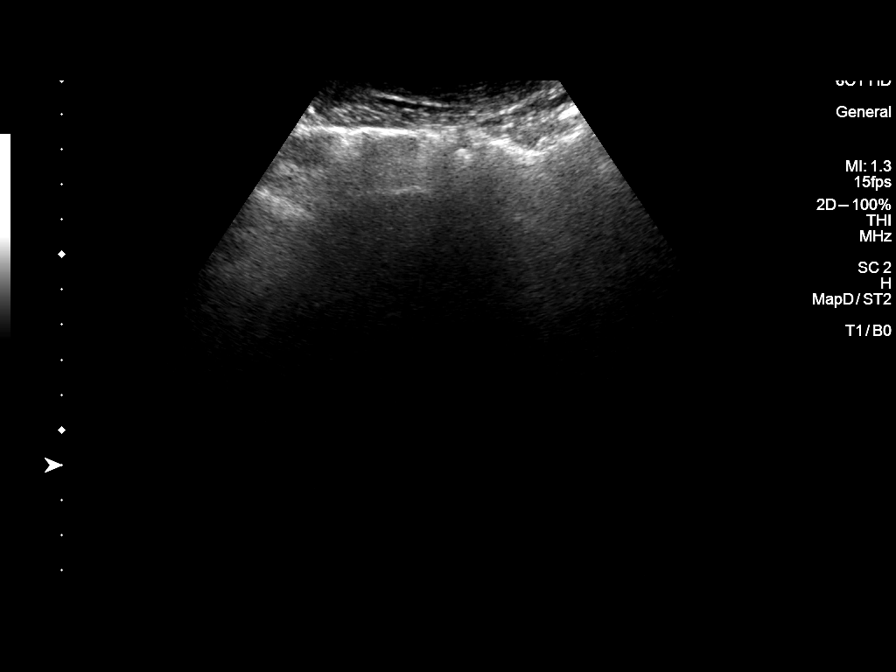
[im 3/9]
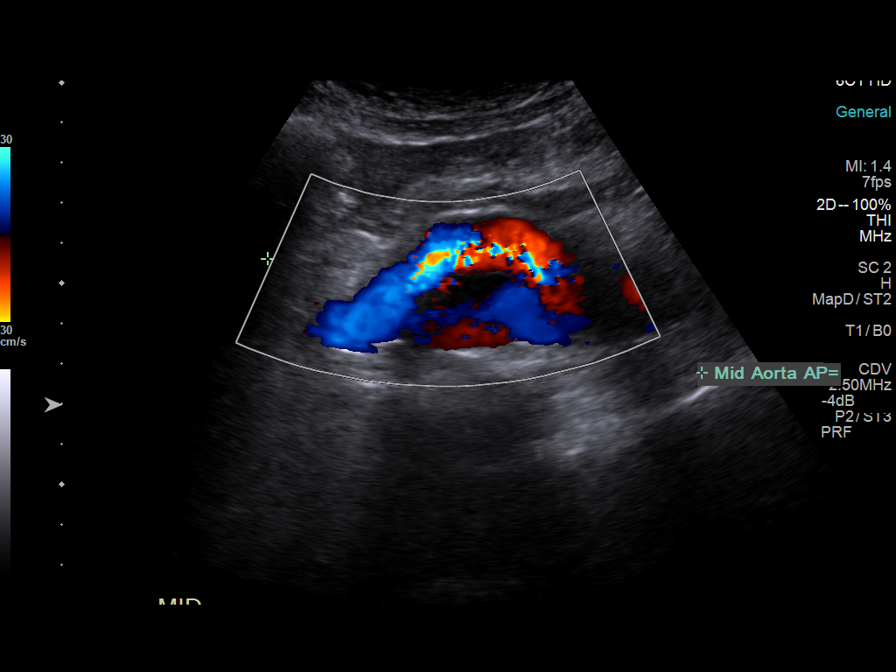
[im 4/9]
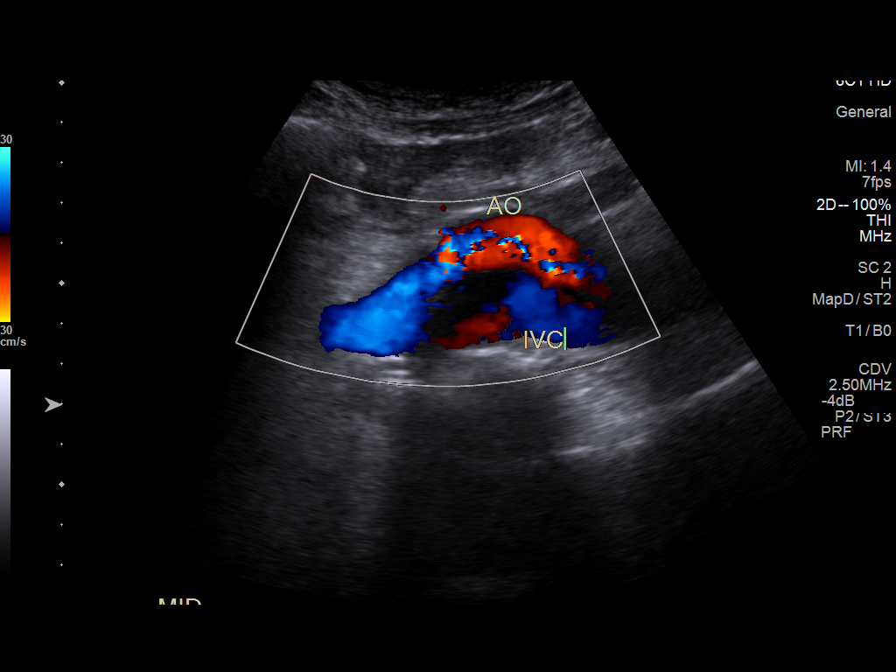
[im 5/9]
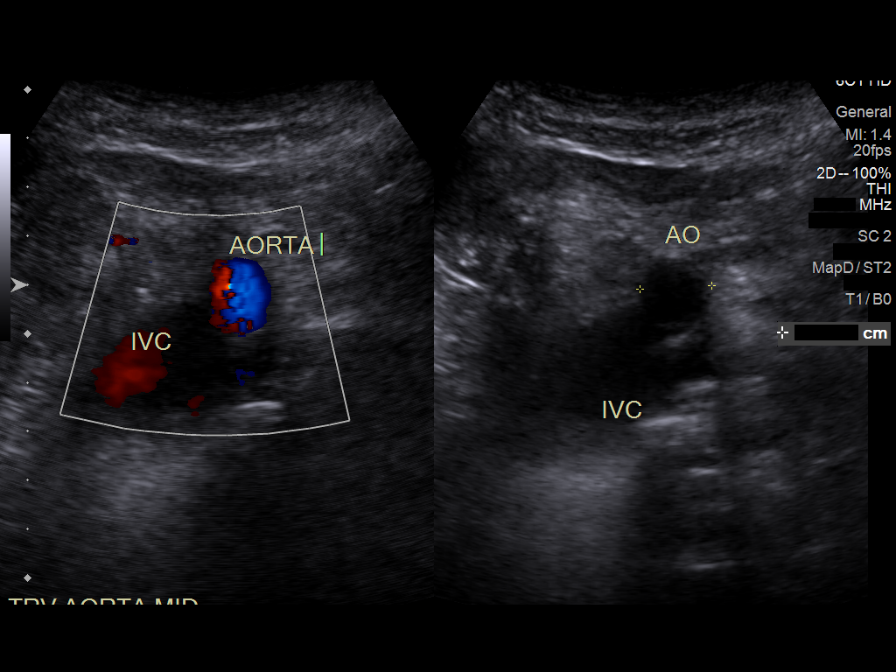
[im 6/9]
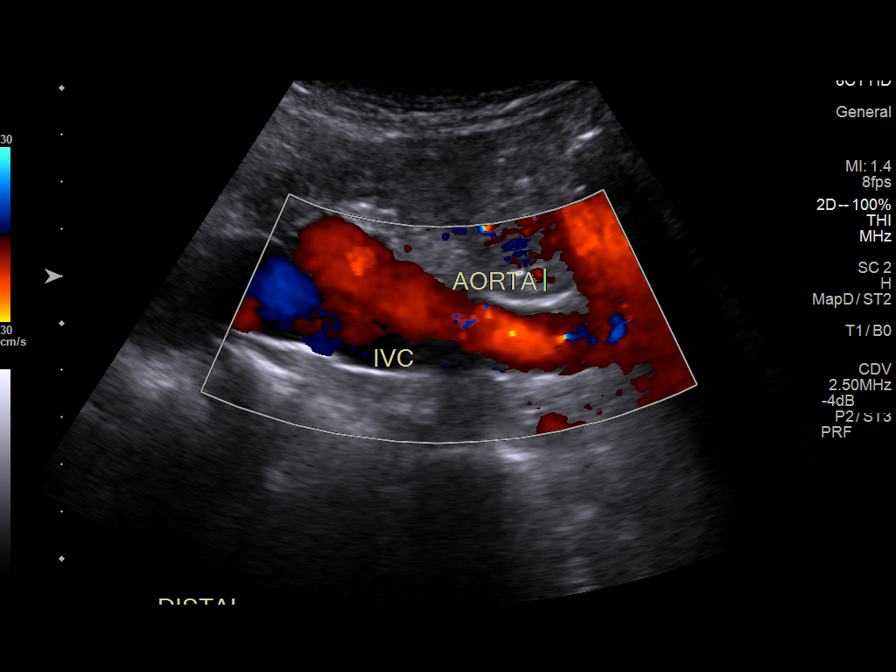
[im 7/9]
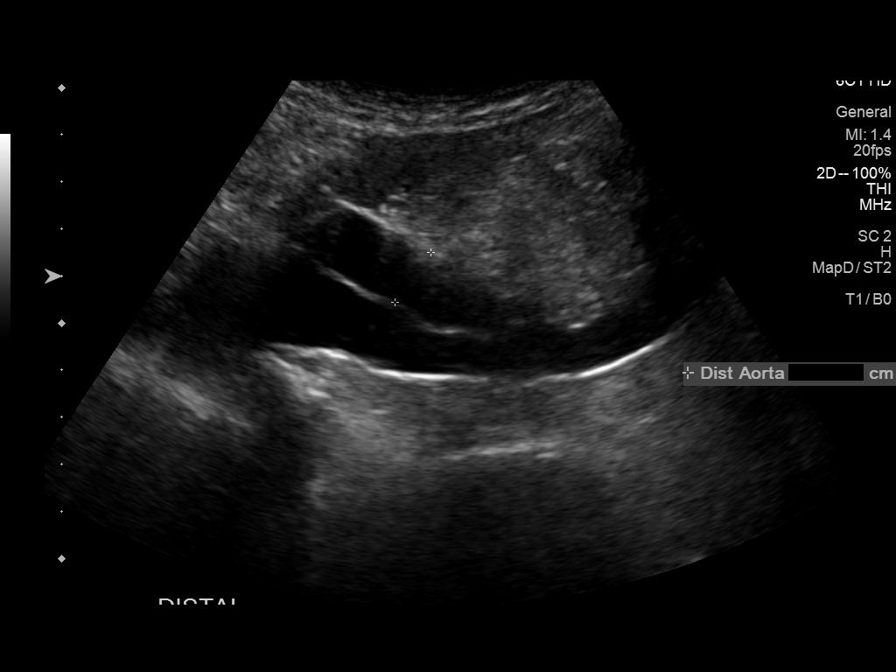
[im 8/9]
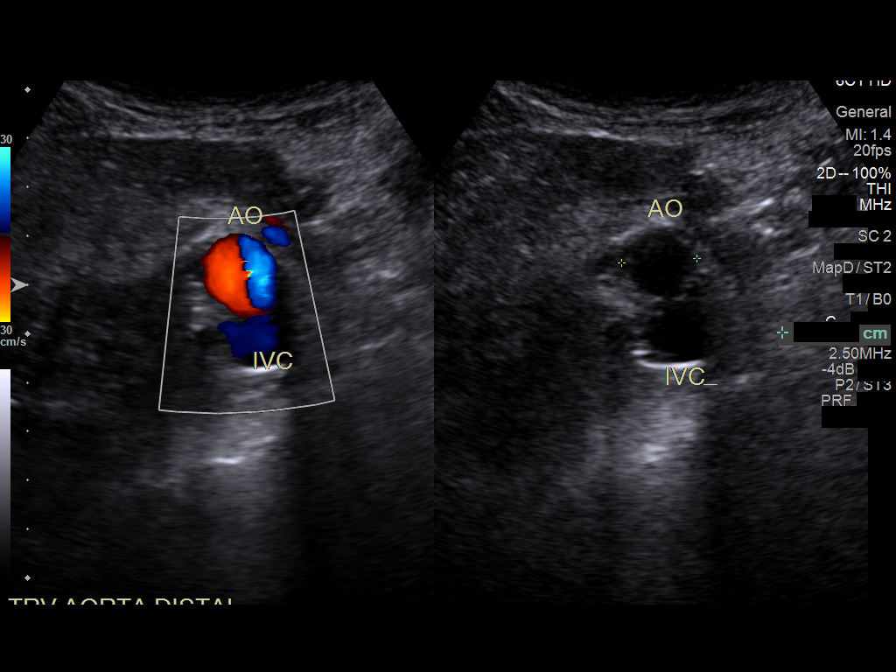
[im 9/9]
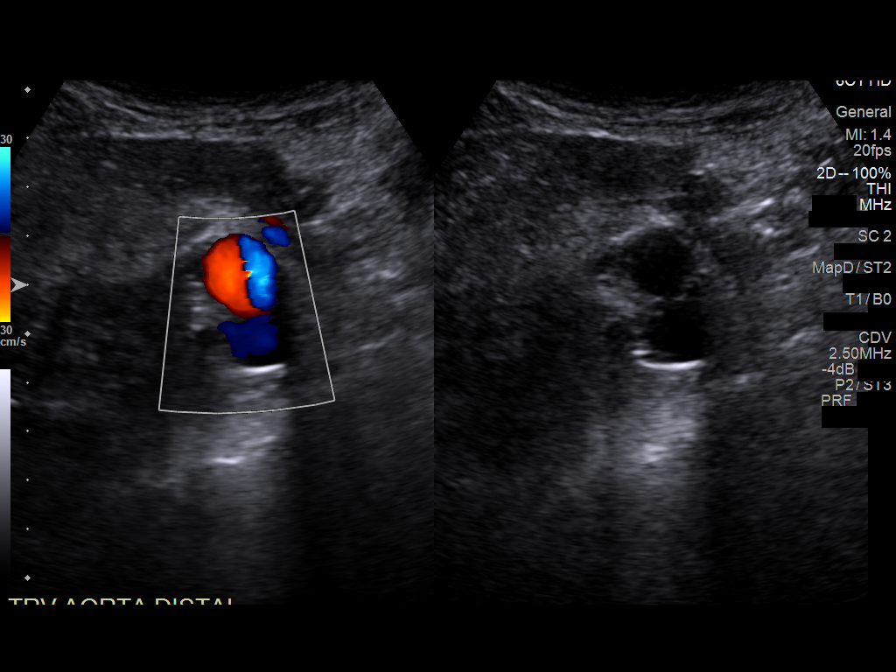

[9 of 9 positions shown; findings below may reference images not displayed]

FINDINGS: Abdominal aortic measurements as follows:

Proximal: Could not be visualized due to shadowing bowel gas.

Mid:  1.5 x 1.5 cm

Distal:  1.3 x 1.4 cm
IMPRESSION: The proximal aorta could not be visualized due to shadowing bowel
gas. No evidence of AAA in the mid or distal aorta.

## 2022-06-12 ENCOUNTER — Ambulatory Visit: Payer: 59 | Attending: Internal Medicine | Admitting: Internal Medicine

## 2022-06-21 ENCOUNTER — Other Ambulatory Visit: Payer: Self-pay | Admitting: Family Medicine

## 2022-06-24 ENCOUNTER — Other Ambulatory Visit: Payer: Self-pay | Admitting: Family Medicine

## 2022-06-25 NOTE — Telephone Encounter (Signed)
Is this okay to refill? 

## 2022-07-20 ENCOUNTER — Other Ambulatory Visit: Payer: Self-pay | Admitting: Family Medicine

## 2022-07-20 NOTE — Telephone Encounter (Signed)
Refill request last apt 01/31/22 next apt 09/05/22.

## 2022-09-02 ENCOUNTER — Other Ambulatory Visit: Payer: Self-pay | Admitting: Family Medicine

## 2022-09-03 ENCOUNTER — Encounter: Payer: 59 | Admitting: Family Medicine

## 2022-09-03 NOTE — Telephone Encounter (Signed)
Refill request last apt 04/12/21 

## 2022-09-05 ENCOUNTER — Ambulatory Visit (INDEPENDENT_AMBULATORY_CARE_PROVIDER_SITE_OTHER): Payer: 59 | Admitting: Family Medicine

## 2022-09-05 ENCOUNTER — Encounter: Payer: Self-pay | Admitting: Family Medicine

## 2022-09-05 VITALS — BP 160/100 | HR 95 | Temp 98.2°F | Resp 16 | Ht 75.0 in | Wt 193.0 lb

## 2022-09-05 DIAGNOSIS — M5090 Cervical disc disorder, unspecified, unspecified cervical region: Secondary | ICD-10-CM

## 2022-09-05 DIAGNOSIS — Z0000 Encounter for general adult medical examination without abnormal findings: Secondary | ICD-10-CM

## 2022-09-05 DIAGNOSIS — D509 Iron deficiency anemia, unspecified: Secondary | ICD-10-CM

## 2022-09-05 DIAGNOSIS — I10 Essential (primary) hypertension: Secondary | ICD-10-CM | POA: Diagnosis not present

## 2022-09-05 DIAGNOSIS — R7302 Impaired glucose tolerance (oral): Secondary | ICD-10-CM

## 2022-09-05 DIAGNOSIS — D692 Other nonthrombocytopenic purpura: Secondary | ICD-10-CM

## 2022-09-05 DIAGNOSIS — N5201 Erectile dysfunction due to arterial insufficiency: Secondary | ICD-10-CM

## 2022-09-05 DIAGNOSIS — M48062 Spinal stenosis, lumbar region with neurogenic claudication: Secondary | ICD-10-CM

## 2022-09-05 DIAGNOSIS — Z85820 Personal history of malignant melanoma of skin: Secondary | ICD-10-CM

## 2022-09-05 DIAGNOSIS — M545 Low back pain, unspecified: Secondary | ICD-10-CM

## 2022-09-05 DIAGNOSIS — L659 Nonscarring hair loss, unspecified: Secondary | ICD-10-CM

## 2022-09-05 DIAGNOSIS — G8929 Other chronic pain: Secondary | ICD-10-CM

## 2022-09-05 DIAGNOSIS — K219 Gastro-esophageal reflux disease without esophagitis: Secondary | ICD-10-CM | POA: Diagnosis not present

## 2022-09-05 DIAGNOSIS — G479 Sleep disorder, unspecified: Secondary | ICD-10-CM

## 2022-09-05 DIAGNOSIS — I491 Atrial premature depolarization: Secondary | ICD-10-CM

## 2022-09-05 MED ORDER — LOSARTAN POTASSIUM 50 MG PO TABS: 50.0000 mg | ORAL_TABLET | Freq: Every day | ORAL | 3 refills | Status: DC

## 2022-09-05 MED ORDER — METOPROLOL SUCCINATE ER 25 MG PO TB24: ORAL_TABLET | ORAL | 3 refills | Status: DC

## 2022-09-05 MED ORDER — BELSOMRA 15 MG PO TABS: ORAL_TABLET | ORAL | 1 refills | Status: DC

## 2022-09-05 MED ORDER — NORTRIPTYLINE HCL 50 MG PO CAPS: 50.0000 mg | ORAL_CAPSULE | Freq: Every day | ORAL | 3 refills | Status: DC

## 2022-09-05 MED ORDER — FINASTERIDE 1 MG PO TABS
1.0000 mg | ORAL_TABLET | Freq: Every day | ORAL | 3 refills | Status: AC
Start: 2022-09-05 — End: ?

## 2022-09-05 NOTE — Progress Notes (Signed)
Complete physical exam  Patient: Joseph [name]   DOB: October 17, 1955   67 y.o. Male  MRN: [id number]  Subjective:    Chief Complaint  Patient presents with   Annual Exam    Non fasting.     Joseph [name] is a 67 y.o. male who presents today for a complete physical exam. He reports consuming a general diet. The patient does not participate in regular exercise at present. He generally feels fairly well. He reports sleeping poorly.  He had robotic surgery for his prostate cancer on March 27 and is still on an FMLA.  His mother recently died.  His work keeps him busy but he is getting burnout concerning this and is considering retiring.  He does use Belsomra to help with his sleep disturbance especially with everything going on in his previous work schedule.  He does have evidence of senile purpura.  His reflux seems to be under good control.  He does use Celebrex as well as Soma Compound for his chronic disc disease and scoliosis.   Most recent fall risk assessment:    09/05/2022    2:52 PM  Fall Risk   Falls in the past year? 0  Number falls in past yr: 0  Injury with Fall? 0  Risk for fall due to : No Fall Risks  Follow up Falls evaluation completed     Most recent depression screenings:    09/05/2022    2:52 PM 08/31/2021    3:09 PM  PHQ 2/9 Scores  PHQ - 2 Score 1 0    Vision:Within last year and Dental: No current dental problems    Patient Care Team: Ronnald Nian, MD as PCP - General (Family Medicine) Croitoru, Rachelle Hora, MD as PCP - Cardiology (Cardiology) Drema Dallas, DO as Consulting Physician (Neurology)   Outpatient Medications Prior to Visit  Medication Sig   Camphor-Menthol-Methyl Sal (SALONPAS) 3.03-31-08 % PTCH Apply 1 patch topically daily as needed (pain).   carisoprodol (SOMA) 350 MG tablet TAKE 1 TABLET BY MOUTH DAILY AS NEEDED FOR MUSCLE SPASMS.   celecoxib (CELEBREX) 200 MG capsule TAKE 1 CAPSULE BY MOUTH TWICE A DAY   Echinacea 400 MG CAPS Take 400  mg by mouth daily.   ferrous sulfate 325 (65 FE) MG tablet ferrous sulfate 325 mg (65 mg iron) tablet   MELATONIN PO Take 10 mg by mouth at bedtime as needed (sleep).   Misc Natural Products (CVS PROSTATE MAX + PO) Take by mouth.   Misc Natural Products (GLUCOSAMINE CHOND CMP TRIPLE) TABS Take 1 tablet by mouth in the morning and at bedtime.   Multiple Vitamins-Minerals (MULTIVITAMIN WITH MINERALS) tablet Take 1 tablet by mouth daily.   Omega-3 Fatty Acids (FISH OIL PO) Take by mouth.   tadalafil (CIALIS) 20 MG tablet TAKE 1 TABLET BY MOUTH EVERY DAY AS NEEDED FOR ERECTILE DYSFUNCTION   Turmeric 500 MG CAPS Take 500 mg by mouth daily.   VITAMIN D PO Take 5,000 Units by mouth daily.   vitamin E 180 MG (400 UNITS) capsule Take 400 Units by mouth daily.   Zinc 50 MG TABS Take 50 mg by mouth daily.   [DISCONTINUED] BELSOMRA 15 MG TABS TAKE 1 TABLET BY MOUTH EVERY DAY AT BEDTIME AS NEEDED   [DISCONTINUED] finasteride (PROPECIA) 1 MG tablet Take 0.25 mg by mouth at bedtime.   [DISCONTINUED] nortriptyline (PAMELOR) 50 MG capsule Take 1 capsule (50 mg total) by mouth at bedtime.   [DISCONTINUED] amoxicillin-clavulanate (  AUGMENTIN) 875-125 MG tablet Take 1 tablet by mouth 2 (two) times daily.   [DISCONTINUED] Ascorbic Acid (VITAMIN C) 1000 MG tablet Take 1,000 mg by mouth daily. (Patient not taking: Reported on 09/05/2022)   [DISCONTINUED] losartan (COZAAR) 50 MG tablet Take 1 tablet (50 mg total) by mouth daily.   [DISCONTINUED] metoprolol succinate (TOPROL-XL) 25 MG 24 hr tablet TAKE 1 TABLET BY MOUTH EVERY DAY WITH OR IMMEDIATELY FOLLOWING A MEAL   No facility-administered medications prior to visit.        Objective:     BP (!) 160/100 (BP Location: Left Arm, Cuff Size: Normal)   Pulse 95   Temp 98.2 F (36.8 C) (Oral)   Resp 16   Ht 6\' 3"  (1.905 m)   Wt 193 lb (87.5 kg)   SpO2 96% Comment: room air  BMI 24.12 kg/m    Physical Exam  Alert and in no distress. Tympanic membranes and  canals are normal. Pharyngeal area is normal. Neck is supple without adenopathy or thyromegaly. Cardiac exam shows a regular sinus rhythm without murmurs or gallops PVCs noted.. Lungs are clear to auscultation.      Assessment & Plan:    Routine general medical examination at a health care facility - Plan: Lipid panel  Hypertension, unspecified type - Plan: losartan (COZAAR) 50 MG tablet, metoprolol succinate (TOPROL-XL) 25 MG 24 hr tablet  PAC (premature atrial contraction) - Plan: metoprolol succinate (TOPROL-XL) 25 MG 24 hr tablet  Senile purpura (HCC)  Gastroesophageal reflux disease without esophagitis  Glucose intolerance (impaired glucose tolerance) - Plan: Comprehensive metabolic panel, Hemoglobin A1c  Chronic bilateral low back pain without sciatica - Plan: nortriptyline (PAMELOR) 50 MG capsule  Iron deficiency anemia, unspecified iron deficiency anemia type - Plan: CBC with Differential/Platelet  Spinal stenosis of lumbar region with neurogenic claudication  History of melanoma  Erectile dysfunction due to arterial insufficiency  Sleep disturbance - Plan: Suvorexant (BELSOMRA) 15 MG TABS  Alopecia - Plan: finasteride (PROPECIA) 1 MG tablet  Cervical disc disease  Immunization History  Administered Date(s) Administered   Fluad Quad(high Dose 65+) 01/10/2020   Influenza Split 04/26/2011   Influenza,inj,Quad PF,6+ Mos 02/12/2013, 01/17/2015, 03/20/2017   PFIZER Comirnaty(Gray Top)Covid-19 Tri-Sucrose Vaccine 08/30/2020   PFIZER(Purple Top)SARS-COV-2 Vaccination 04/20/2019, 05/11/2019, 12/09/2019   Pneumococcal Conjugate-13 08/30/2020   Pneumococcal Polysaccharide-23 08/31/2021   Td 11/02/2015   Tdap 03/26/2005   Zoster Recombinat (Shingrix) 08/27/2019, 12/01/2019   Zoster, Live 01/17/2015    Health Maintenance  Topic Date Due   COVID-19 Vaccine (5 - 2023-24 season) 11/24/2021   INFLUENZA VACCINE  10/25/2022   Colonoscopy  01/17/2025   DTaP/Tdap/Td (3 - Td  or Tdap) 11/01/2025   Pneumonia Vaccine 49+ Years old  Completed   Hepatitis C Screening  Completed   Zoster Vaccines- Shingrix  Completed   HPV VACCINES  Aged Out    Discussed the present situation with his mother and the burnout.  Strongly encouraged him to consider go ahead and retiring.  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  Continue on present medication regimen.  Problem List Items Addressed This Visit     Cervical disc disease   Chronic back pain   Relevant Medications   nortriptyline (PAMELOR) 50 MG capsule   ED (erectile dysfunction)   GERD (gastroesophageal reflux disease)   Glucose intolerance (impaired glucose tolerance)   Relevant Orders   Comprehensive metabolic panel   Hemoglobin A1c   History of melanoma   Hypertension  Relevant Medications   losartan (COZAAR) 50 MG tablet   metoprolol succinate (TOPROL-XL) 25 MG 24 hr tablet   Iron deficiency anemia   Relevant Orders   CBC with Differential/Platelet   PAC (premature atrial contraction)   Relevant Medications   losartan (COZAAR) 50 MG tablet   metoprolol succinate (TOPROL-XL) 25 MG 24 hr tablet   Senile purpura (HCC)   Relevant Medications   losartan (COZAAR) 50 MG tablet   metoprolol succinate (TOPROL-XL) 25 MG 24 hr tablet   Spinal stenosis of lumbar region   Other Visit Diagnoses     Routine general medical examination at a health care facility    -  Primary   Relevant Orders   Lipid panel   Sleep disturbance       Relevant Medications   Suvorexant (BELSOMRA) 15 MG TABS   Alopecia       Relevant Medications   finasteride (PROPECIA) 1 MG tablet      Follow-up care in a month or 2.  Also reevaluate based on recent blood work.    Sharlot Gowda, MD

## 2022-09-06 LAB — LIPID PANEL
Chol/HDL Ratio: 3.9 ratio (ref 0.0–5.0)
Cholesterol, Total: 153 mg/dL (ref 100–199)
HDL: 39 mg/dL — ABNORMAL LOW (ref >39)
LDL Chol Calc (NIH): 101 mg/dL — ABNORMAL HIGH (ref 0–99)
Triglycerides: 67 mg/dL (ref 0–149)
VLDL Cholesterol Cal: 13 mg/dL (ref 5–40)

## 2022-09-06 LAB — COMPREHENSIVE METABOLIC PANEL
ALT: 117 IU/L — ABNORMAL HIGH (ref 0–44)
AST: 90 IU/L — ABNORMAL HIGH (ref 0–40)
Albumin/Globulin Ratio: 1.4
Albumin: 3.8 g/dL — ABNORMAL LOW (ref 3.9–4.9)
Alkaline Phosphatase: 211 IU/L — ABNORMAL HIGH (ref 44–121)
BUN/Creatinine Ratio: 14 (ref 10–24)
BUN: 14 mg/dL (ref 8–27)
Bilirubin Total: <0.2 mg/dL (ref 0.0–1.2)
CO2: 22 mmol/L (ref 20–29)
Calcium: 9.1 mg/dL (ref 8.6–10.2)
Chloride: 98 mmol/L (ref 96–106)
Creatinine, Ser: 1 mg/dL (ref 0.76–1.27)
Globulin, Total: 2.8 g/dL (ref 1.5–4.5)
Glucose: 97 mg/dL (ref 70–99)
Potassium: 4.8 mmol/L (ref 3.5–5.2)
Sodium: 134 mmol/L (ref 134–144)
Total Protein: 6.6 g/dL (ref 6.0–8.5)
eGFR: 82 mL/min/{1.73_m2} (ref >59)

## 2022-09-06 LAB — CBC WITH DIFFERENTIAL/PLATELET
Basophils Absolute: 0.1 10*3/uL (ref 0.0–0.2)
Basos: 1 %
EOS (ABSOLUTE): 0.1 10*3/uL (ref 0.0–0.4)
Eos: 1 %
Hematocrit: 32 % — ABNORMAL LOW (ref 37.5–51.0)
Hemoglobin: 10.6 g/dL — ABNORMAL LOW (ref 13.0–17.7)
Immature Grans (Abs): 0.1 10*3/uL (ref 0.0–0.1)
Immature Granulocytes: 1 %
Lymphocytes Absolute: 1.1 10*3/uL (ref 0.7–3.1)
Lymphs: 11 %
MCH: 30.3 pg (ref 26.6–33.0)
MCHC: 33.1 g/dL (ref 31.5–35.7)
MCV: 91 fL (ref 79–97)
Monocytes Absolute: 1.3 10*3/uL — ABNORMAL HIGH (ref 0.1–0.9)
Monocytes: 13 %
Neutrophils Absolute: 7.8 10*3/uL — ABNORMAL HIGH (ref 1.4–7.0)
Neutrophils: 73 %
Platelets: 386 10*3/uL (ref 150–450)
RBC: 3.5 x10E6/uL — ABNORMAL LOW (ref 4.14–5.80)
RDW: 12.2 % (ref 11.6–15.4)
WBC: 10.5 10*3/uL (ref 3.4–10.8)

## 2022-09-06 LAB — HEMOGLOBIN A1C
Est. average glucose Bld gHb Est-mCnc: 131 mg/dL
Hgb A1c MFr Bld: 6.2 % — ABNORMAL HIGH (ref 4.8–5.6)

## 2022-09-26 ENCOUNTER — Other Ambulatory Visit: Payer: Self-pay | Admitting: Family Medicine

## 2022-09-26 NOTE — Telephone Encounter (Signed)
Is this okay to refill? 

## 2022-10-11 ENCOUNTER — Telehealth: Payer: Self-pay | Admitting: Family Medicine

## 2022-10-11 ENCOUNTER — Other Ambulatory Visit: Payer: 59

## 2022-10-11 DIAGNOSIS — R748 Abnormal levels of other serum enzymes: Secondary | ICD-10-CM

## 2022-10-11 NOTE — Telephone Encounter (Signed)
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

## 2022-10-12 ENCOUNTER — Encounter: Payer: Self-pay | Admitting: Family Medicine

## 2022-10-12 LAB — COMPREHENSIVE METABOLIC PANEL
ALT: 14 IU/L (ref 0–44)
AST: 19 IU/L (ref 0–40)
Albumin: 3.8 g/dL — ABNORMAL LOW (ref 3.9–4.9)
Alkaline Phosphatase: 126 IU/L — ABNORMAL HIGH (ref 44–121)
BUN/Creatinine Ratio: 12 (ref 10–24)
BUN: 12 mg/dL (ref 8–27)
Bilirubin Total: <0.2 mg/dL (ref 0.0–1.2)
CO2: 22 mmol/L (ref 20–29)
Calcium: 9.1 mg/dL (ref 8.6–10.2)
Chloride: 99 mmol/L (ref 96–106)
Creatinine, Ser: 1.02 mg/dL (ref 0.76–1.27)
Globulin, Total: 2.6 g/dL (ref 1.5–4.5)
Glucose: 96 mg/dL (ref 70–99)
Potassium: 4.4 mmol/L (ref 3.5–5.2)
Sodium: 136 mmol/L (ref 134–144)
Total Protein: 6.4 g/dL (ref 6.0–8.5)
eGFR: 80 mL/min/{1.73_m2} (ref >59)

## 2022-10-25 ENCOUNTER — Telehealth: Payer: Self-pay | Admitting: Family Medicine

## 2022-10-25 NOTE — Telephone Encounter (Signed)
Pt called and I let him know his repeated blood work looked much better per Dr.Lalonde. He states he stopped taking tylenol the month prior to retesting and he wants to know if he should continue to not take tylenol or if you think that was the issue?

## 2022-10-28 IMAGING — DX DG KNEE COMPLETE 4+V*R*
4 series · 4 of 4 positions shown · non-contrast
Comparison: September 03, 2019

CLINICAL DATA: Pain following fall with twisting injury

EXAM:
RIGHT KNEE - COMPLETE 4+ VIEW

[knee pa]
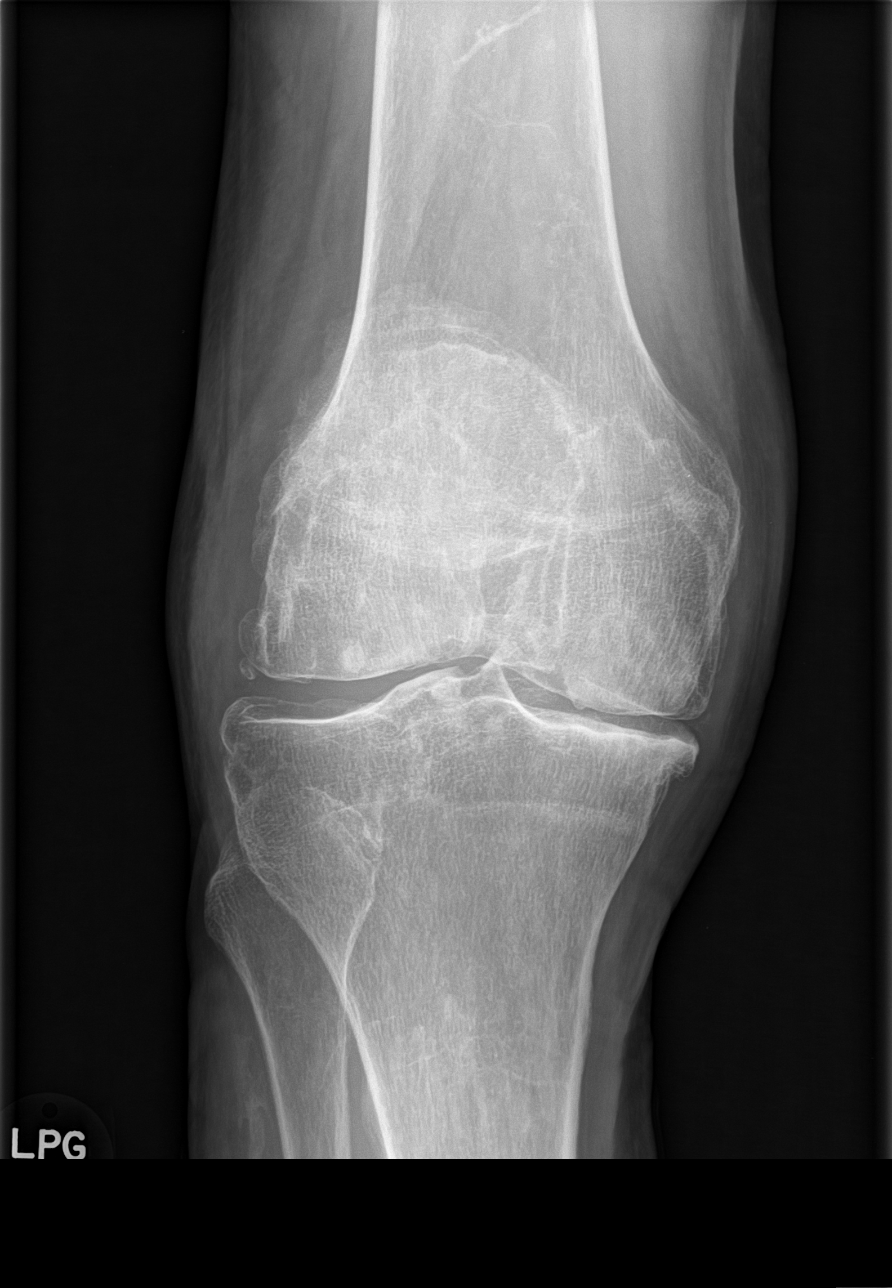

[knee obl (1 of 2)]
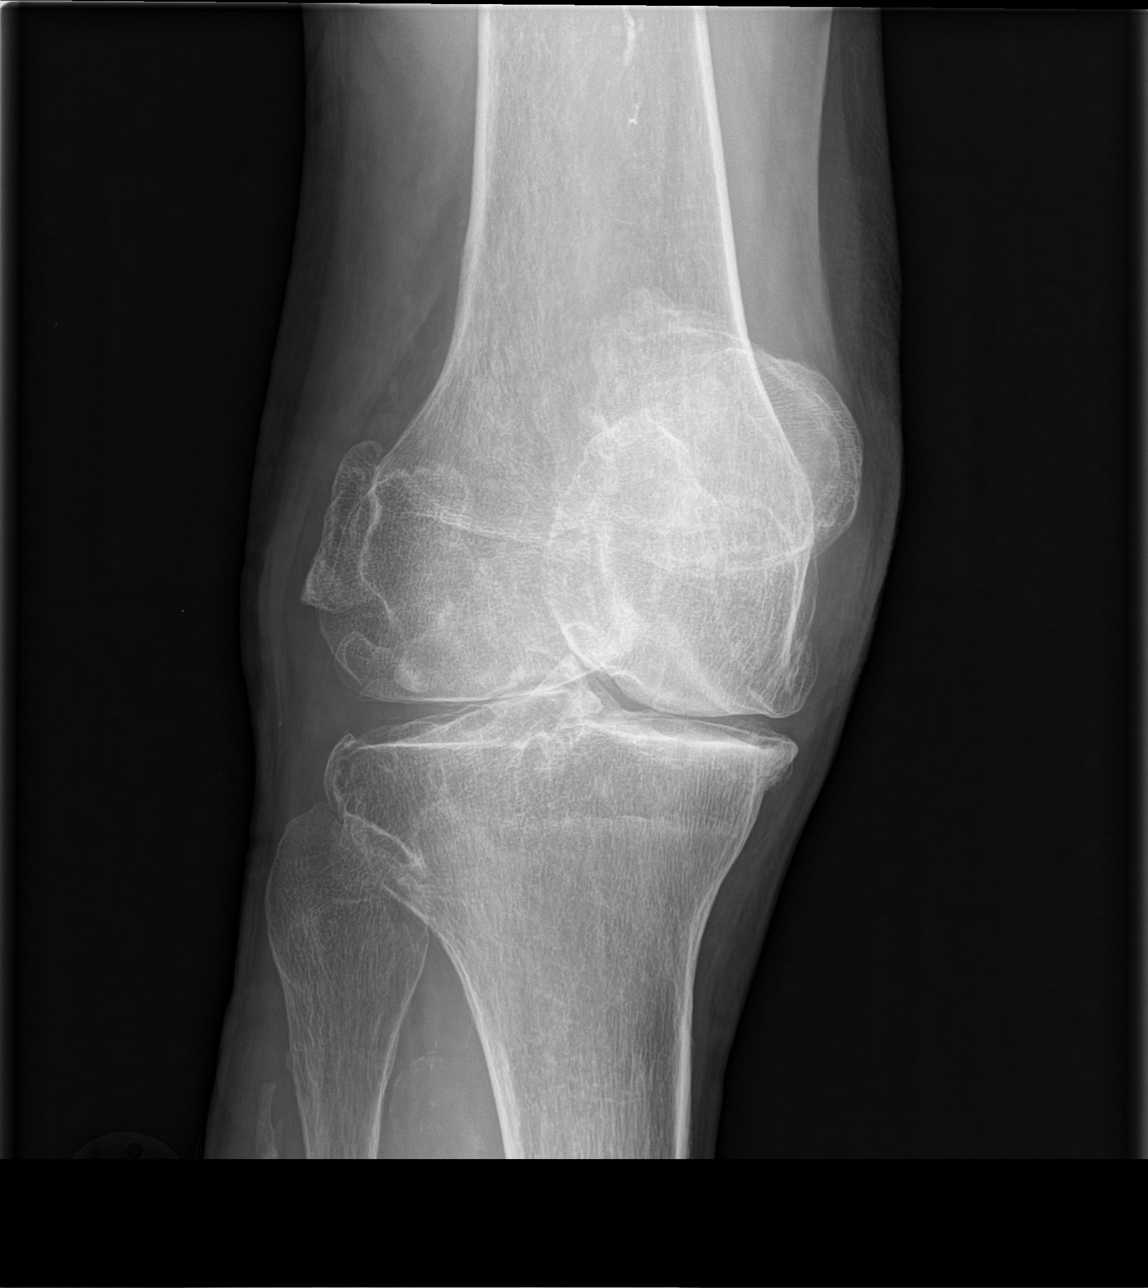

[knee obl (2 of 2)]
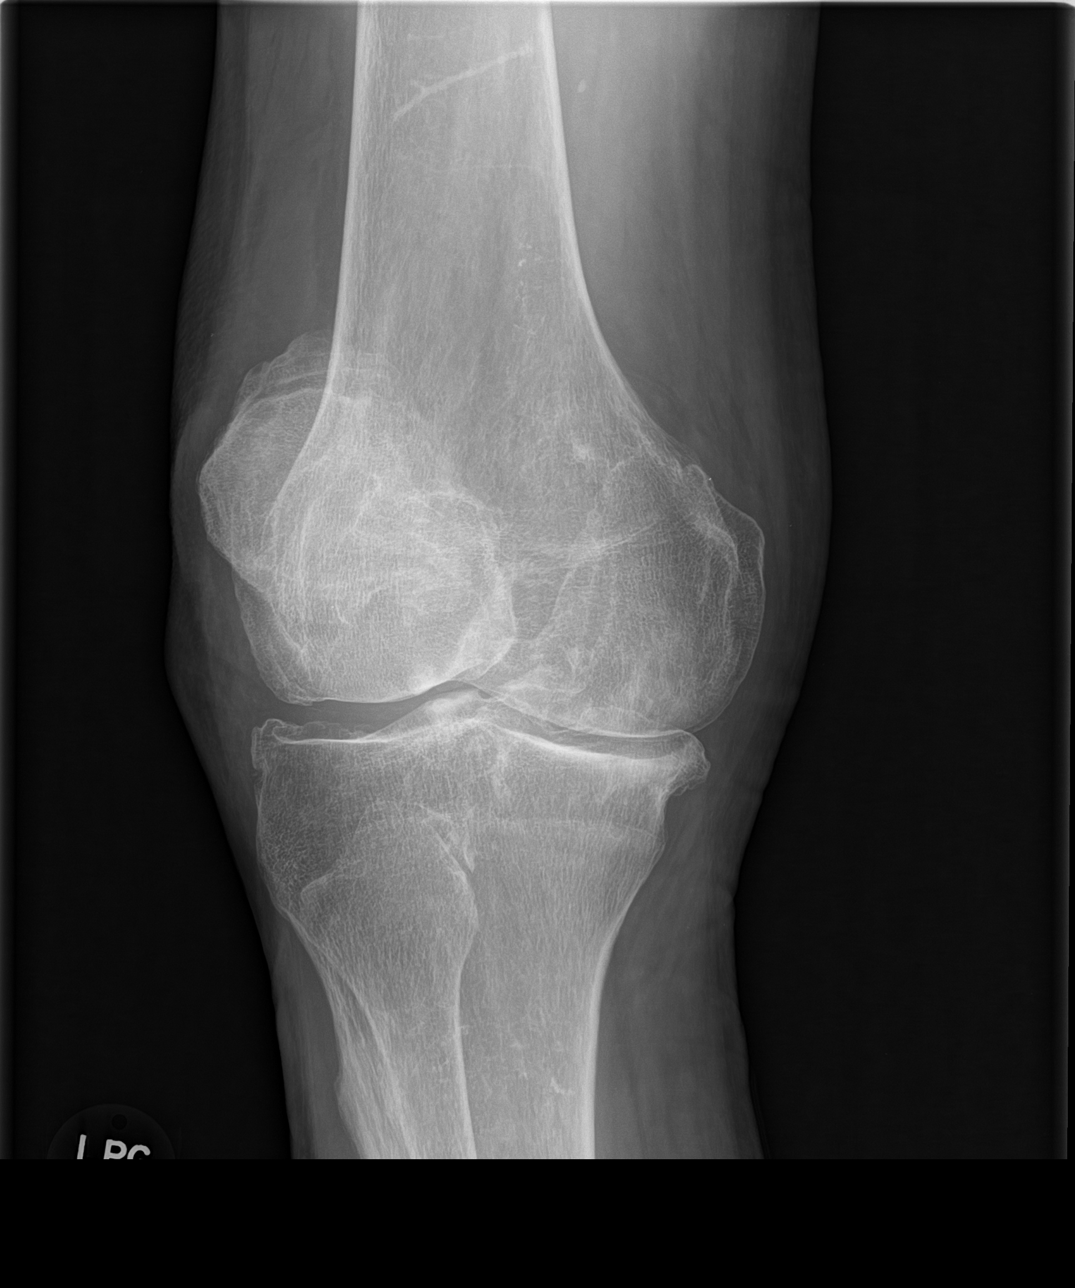

[knee lat]
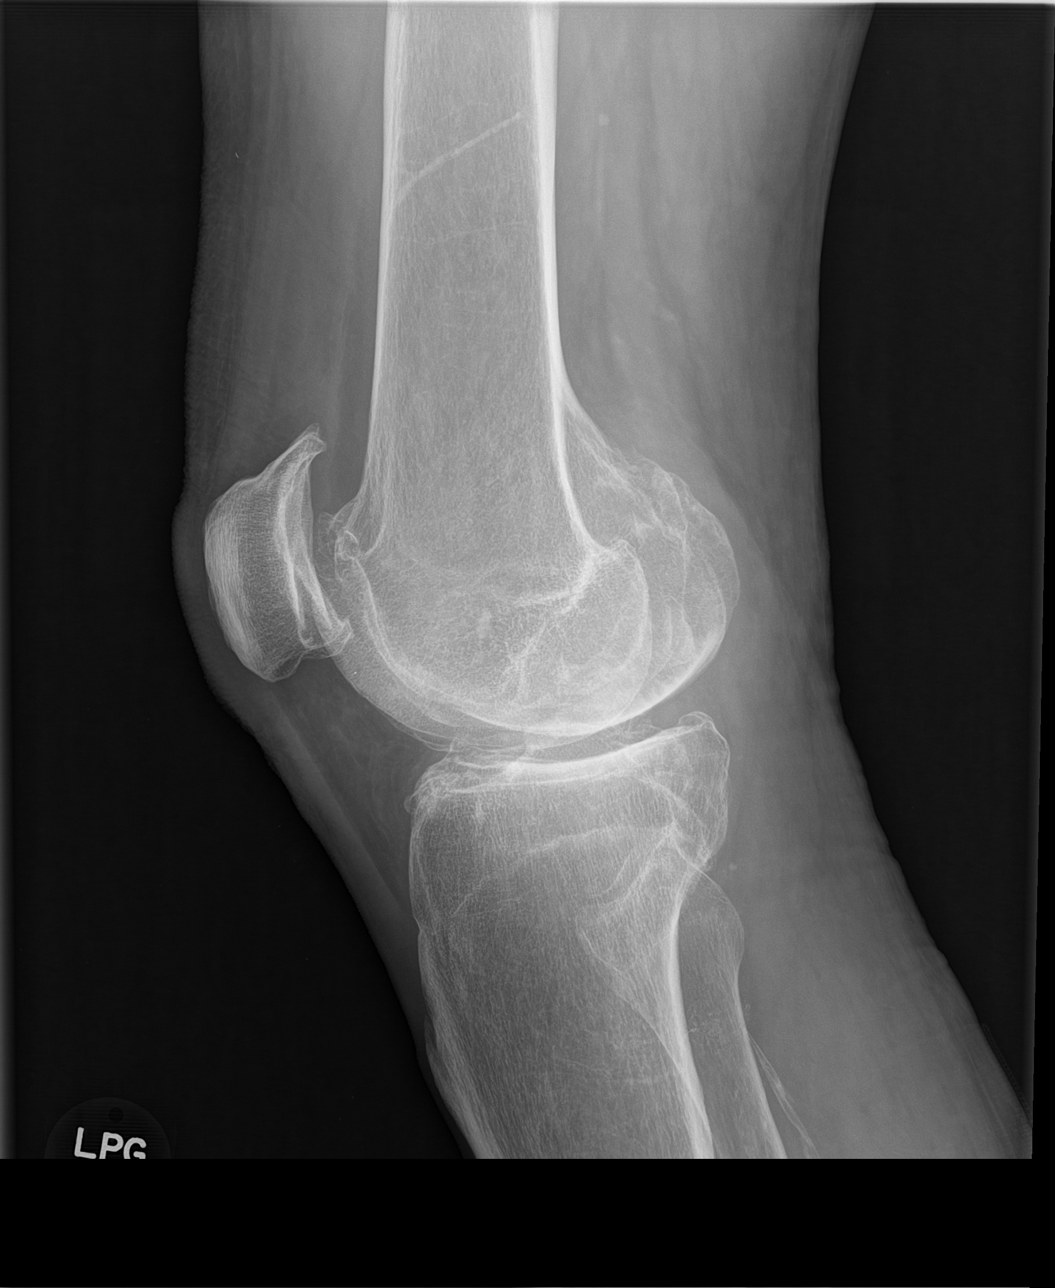

[4 of 4 positions shown; findings below may reference images not displayed]

FINDINGS: Frontal, lateral, and bilateral oblique views were obtained. There
is no fracture, dislocation, or joint effusion. There is moderately
severe narrowing medially with moderate narrowing of the
patellofemoral joint. There is spurring in all compartments, most
severe along the posterior patella. No erosive change. There is mild
chondrocalcinosis.
IMPRESSION: Osteoarthritic change, most severe medially but with moderate
narrowing in the patellofemoral joint as well. There is a degree of
chondrocalcinosis which may be seen with osteoarthritis or calcium
pyrophosphate deposition disease. There is no fracture, dislocation,
or joint effusion.

## 2022-10-30 ENCOUNTER — Other Ambulatory Visit: Payer: Self-pay | Admitting: Family Medicine

## 2022-11-20 ENCOUNTER — Other Ambulatory Visit: Payer: Self-pay | Admitting: Family Medicine

## 2022-11-20 DIAGNOSIS — K219 Gastro-esophageal reflux disease without esophagitis: Secondary | ICD-10-CM

## 2022-11-20 DIAGNOSIS — N5201 Erectile dysfunction due to arterial insufficiency: Secondary | ICD-10-CM

## 2022-11-21 NOTE — Telephone Encounter (Signed)
Is this okay to refill? 

## 2022-11-23 NOTE — Progress Notes (Unsigned)
NEUROLOGY FOLLOW UP OFFICE NOTE  Joseph [name] [id number]  Assessment/Plan:   1.  Cervicogenic headache 2.  Cervical spondylosis with degenerative disc disease and spinal stenosis 3.  Elevated blood pressure reading    1.  nortriptyline 50mg  at bedtime 2.  Follow up with PCP regarding elevated blood pressure 3.  Follow up in one year   Subjective:  Joseph [name] is a 67 year old right-handed man who follows up for cervicogenic headache with cervical spondylosis.   UPDATE: Current medication:  nortriptyline 50mg  at bedtime Headaches overall stable.  He retired last month, so no longer sitting at the computer screen all of the time, which has helped.  He may have a headache no more than 2 times a month.  Neck stretches help.        HISTORY: In August 2016, he began experiencing bilateral posterior neck pain radiating up to the upper cervical region and down to between the shoulder blades.  He describes it as an aching pain.  About 3 days a week, it is accompanied by a headache in a band-like distribution.  He also reports burning sensation at the C7 spinal process.  He reports that a week prior to onset, he performed manual labor in the yard, such as chopping wood and pushing a wheel barrel of dirt.  He has undergone 5 weeks of PT with moderate relief.  The neck pain seems a little worse after a session.  Sometimes he notes numbness in the left arm and hand.  Plain films of cervical spine demonstrated multilevel degenerative disc disease with multiple osseous neural foraminal narrowing, most pronounced at C4-5, C5-6 and C6-7.  Craniocervical junctuion was unremarkable.  MRI of cervical spine from 01/14/15.  It reveals multilevel spondylosis moderate left C3-4 foraminal stenosis, moderate to severe bilateral C5-6 foraminal narrowing and severe left and moderate right C6-7 foraminal stenosis.  There is mild central stenosis with moderate left central stenosis at C6-7. Repeat MRI of cervical  spine from 06/12/2018 showed progressed multilevel degenerative changes with neural foraminal stenosis as well as moderate canal stenosis at C3-4.  He was evaluated by spine surgeon who sent him to Dr. Murray Hodgkins of pain management.  He received an epidural which was helpful.   In [place] 2021, he reported very brief disorientation while driving.  At an intersection, it may take a couple of seconds to get his bearings.  It really only occurs at night, not during the day.  Also, he may sometimes have word-finding difficulty or can't recall somebody's name right away. B12 and TSH from March 2021 were 441 and 1.20 respectively.    PAST MEDICAL HISTORY: Past Medical History:  Diagnosis Date   Alopecia    DDD (degenerative disc disease)    Degenerative scoliosis 07/07/2018   Diverticulosis    ED (erectile dysfunction)    GERD (gastroesophageal reflux disease)    History of kidney stones    Melanoma (HCC)    sees Dr. Margo Aye.  back and chest   Neurogenic claudication 07/07/2018   Spinal stenosis of lumbar region 07/07/2018    MEDICATIONS: Current Outpatient Medications on File Prior to Visit  Medication Sig Dispense Refill   Camphor-Menthol-Methyl Sal (SALONPAS) 3.03-31-08 % PTCH Apply 1 patch topically daily as needed (pain).     carisoprodol (SOMA) 350 MG tablet TAKE 1 TABLET BY MOUTH DAILY AS NEEDED FOR MUSCLE SPASMS. 30 tablet 1   celecoxib (CELEBREX) 200 MG capsule TAKE 1 CAPSULE BY MOUTH TWICE A DAY 60 capsule 2  Echinacea 400 MG CAPS Take 400 mg by mouth daily.     ferrous sulfate 325 (65 FE) MG tablet ferrous sulfate 325 mg (65 mg iron) tablet     finasteride (PROPECIA) 1 MG tablet Take 1 tablet (1 mg total) by mouth at bedtime. 90 tablet 3   losartan (COZAAR) 50 MG tablet Take 1 tablet (50 mg total) by mouth daily. 90 tablet 3   MELATONIN PO Take 10 mg by mouth at bedtime as needed (sleep).     metoprolol succinate (TOPROL-XL) 25 MG 24 hr tablet TAKE 1 TABLET BY MOUTH EVERY DAY WITH OR  IMMEDIATELY FOLLOWING A MEAL 90 tablet 3   Misc Natural Products (CVS PROSTATE MAX + PO) Take by mouth.     Misc Natural Products (GLUCOSAMINE CHOND CMP TRIPLE) TABS Take 1 tablet by mouth in the morning and at bedtime.     Multiple Vitamins-Minerals (MULTIVITAMIN WITH MINERALS) tablet Take 1 tablet by mouth daily.     nortriptyline (PAMELOR) 50 MG capsule Take 1 capsule (50 mg total) by mouth at bedtime. 90 capsule 3   Omega-3 Fatty Acids (FISH OIL PO) Take by mouth.     Suvorexant (BELSOMRA) 15 MG TABS TAKE 1 TABLET BY MOUTH EVERY DAY AT BEDTIME AS NEEDED 30 tablet 1   tadalafil (CIALIS) 20 MG tablet TAKE ONE TABLET BY MOUTH DAILY AS NEEDED FOR ERECTILE DYSFUNCTION 30 tablet 0   Turmeric 500 MG CAPS Take 500 mg by mouth daily.     VITAMIN D PO Take 5,000 Units by mouth daily.     vitamin E 180 MG (400 UNITS) capsule Take 400 Units by mouth daily.     Zinc 50 MG TABS Take 50 mg by mouth daily.     No current facility-administered medications on file prior to visit.    ALLERGIES: Allergies  Allergen Reactions   Daypro [Oxaprozin]     GASTRIC PROBLEMS   Methadone Itching    FAMILY HISTORY: Family History  Problem Relation Age of Onset   Transient ischemic attack Father    Multiple myeloma Father    Bladder Cancer Father    Celiac disease Mother    Scoliosis Mother    Colon cancer Neg Hx    Esophageal cancer Neg Hx    Liver disease Neg Hx    Kidney disease Neg Hx    Colon polyps Neg Hx       Objective:  Blood pressure (!) 146/96, pulse (!) 106, height 6\' 3"  (1.905 m), weight 188 lb (85.3 kg), SpO2 97%. General: No acute distress.  Patient appears well-groomed.   Head:  Normocephalic/atraumatic Eyes:  Fundi examined but not visualized Neurological Exam: alert and oriented.  Speech fluent and not dysarthric, language intact.  CN II-XII intact. Bulk and tone normal, muscle strength 5/5 throughout.  Sensation to light touch intact.  Deep tendon reflexes 2+ throughout.  Finger  to nose testing intact.  Gait normal, Romberg negative.   Shon Millet, DO  CC: Sharlot Gowda, MD

## 2022-11-27 ENCOUNTER — Ambulatory Visit (INDEPENDENT_AMBULATORY_CARE_PROVIDER_SITE_OTHER): Payer: Medicare Other | Admitting: Neurology

## 2022-11-27 ENCOUNTER — Encounter: Payer: Self-pay | Admitting: Neurology

## 2022-11-27 VITALS — BP 146/96 | HR 106 | Ht 75.0 in | Wt 188.0 lb

## 2022-11-27 DIAGNOSIS — G4486 Cervicogenic headache: Secondary | ICD-10-CM | POA: Diagnosis not present

## 2022-11-27 DIAGNOSIS — M47812 Spondylosis without myelopathy or radiculopathy, cervical region: Secondary | ICD-10-CM

## 2022-11-27 DIAGNOSIS — R030 Elevated blood-pressure reading, without diagnosis of hypertension: Secondary | ICD-10-CM

## 2022-11-27 NOTE — Patient Instructions (Signed)
Nortriptyline 50mg  at bedtime

## 2022-12-18 ENCOUNTER — Ambulatory Visit (INDEPENDENT_AMBULATORY_CARE_PROVIDER_SITE_OTHER): Payer: Medicare Other | Admitting: Medical

## 2022-12-18 ENCOUNTER — Encounter: Payer: Self-pay | Admitting: Medical

## 2022-12-18 VITALS — BP 132/72 | HR 90 | Ht 74.0 in | Wt 188.0 lb

## 2022-12-18 DIAGNOSIS — H6123 Impacted cerumen, bilateral: Secondary | ICD-10-CM | POA: Diagnosis not present

## 2022-12-18 DIAGNOSIS — Z23 Encounter for immunization: Secondary | ICD-10-CM

## 2022-12-18 DIAGNOSIS — H9193 Unspecified hearing loss, bilateral: Secondary | ICD-10-CM

## 2022-12-18 NOTE — Progress Notes (Signed)
Subjective:   Here for decreased hearing, possible ear wax. Symptoms include decreased hearing.  Went for hearing screen and they advised he come here for cerumen removal first.   He has prior history of similar.  No other aggravating or relieving factors.  No other complaint.  Review of Systems Constitutional: denies fever, chills, sweats ENT: no runny nose, ear pain, sore throat, hoarseness, sinus pain, teeth pain, tinnitus, hearing loss Gastroenterology: denies nausea, vomiting     Objective:   Physical Exam  General appearance: alert, no distress, WD/WN Ears: bilat ear canal with impacted cerumen HENT: conjunctiva/corneas normal, sclerae anicteric, nares patent, no discharge or erythema, pharynx normal Oral cavity: MMM, tongue normal, teeth normal Neurological: Hearing normal bilaterally to whisper    Assessment & Plan:    Encounter Diagnoses  Name Primary?   Decreased hearing of both ears Yes   Need for influenza vaccination    Need for COVID-19 vaccine    Impacted cerumen of both ears     Discussed findings.  Discussed risk/benefits of procedure and patient agrees to procedure. Successfully used warm water lavage to remove impacted cerumen from bilat ear canal. Patient tolerated procedure well. Advised they avoid using any cotton swabs or other devices to clean the ear canals.  Use basic hygiene as discussed.    Counseled on the influenza virus vaccine.  Vaccine information sheet given.  Influenza vaccine given after consent obtained.  Counseled on the Covid virus vaccine.  Vaccine information sheet given.  Covid vaccine given after consent obtained.  Joseph [name] was seen today for cerumen impaction.  Diagnoses and all orders for this visit:  Decreased hearing of both ears  Need for influenza vaccination -     Flu Vaccine Trivalent High Dose (Fluad)  Need for COVID-19 vaccine -     Pfizer Comirnaty Covid -19 Vaccine 27yrs and older  Impacted cerumen of both  ears     Follow up prn.

## 2022-12-27 ENCOUNTER — Other Ambulatory Visit: Payer: Self-pay | Admitting: Family Medicine

## 2022-12-27 DIAGNOSIS — K219 Gastro-esophageal reflux disease without esophagitis: Secondary | ICD-10-CM

## 2022-12-27 DIAGNOSIS — N5201 Erectile dysfunction due to arterial insufficiency: Secondary | ICD-10-CM

## 2022-12-28 NOTE — Telephone Encounter (Signed)
Last apt 09/05/22

## 2022-12-29 ENCOUNTER — Other Ambulatory Visit: Payer: Self-pay | Admitting: Family Medicine

## 2023-01-07 ENCOUNTER — Other Ambulatory Visit: Payer: Self-pay | Admitting: Family Medicine

## 2023-01-09 ENCOUNTER — Other Ambulatory Visit: Payer: Self-pay | Admitting: Family Medicine

## 2023-01-09 DIAGNOSIS — G479 Sleep disorder, unspecified: Secondary | ICD-10-CM

## 2023-01-26 ENCOUNTER — Telehealth: Payer: Self-pay | Admitting: Family Medicine

## 2023-01-26 NOTE — Telephone Encounter (Signed)
P.A. CARISOPRODOL

## 2023-02-09 ENCOUNTER — Other Ambulatory Visit: Payer: Self-pay | Admitting: Family Medicine

## 2023-02-09 DIAGNOSIS — N5201 Erectile dysfunction due to arterial insufficiency: Secondary | ICD-10-CM

## 2023-02-09 DIAGNOSIS — K219 Gastro-esophageal reflux disease without esophagitis: Secondary | ICD-10-CM

## 2023-03-05 ENCOUNTER — Other Ambulatory Visit: Payer: Self-pay | Admitting: Family Medicine

## 2023-03-05 DIAGNOSIS — G479 Sleep disorder, unspecified: Secondary | ICD-10-CM

## 2023-03-05 NOTE — Telephone Encounter (Signed)
Is this okay to refill? 

## 2023-03-11 ENCOUNTER — Other Ambulatory Visit (HOSPITAL_COMMUNITY): Payer: Self-pay

## 2023-03-11 ENCOUNTER — Other Ambulatory Visit: Payer: Self-pay | Admitting: Family Medicine

## 2023-03-11 ENCOUNTER — Telehealth: Payer: Self-pay

## 2023-03-11 NOTE — Telephone Encounter (Signed)
Pharmacy Patient Advocate Encounter   Received notification from Physician's Office that prior authorization for Carisoprodol 350MG  tablets is required/requested.   Insurance verification completed.   The patient is insured through [facility] .   Per test claim: PA required; PA submitted to above mentioned insurance via CoverMyMeds Key/confirmation #/EOC Key: [id number]  Status is pending

## 2023-03-11 NOTE — Telephone Encounter (Signed)
Pharmacy Patient Advocate Encounter  Received notification from [facility] that Prior Authorization for {Carisoprodol 350MG  tabletshas been APPROVED  to 12.31.25. Ran test claim, Copay is $7.00. This test claim was processed through [facility]- copay amounts may vary at other pharmacies due to pharmacy/plan contracts, or as the patient moves through the different stages of their insurance plan.   PA #/Case ID/Reference #: Key: Shaune Pascal

## 2023-03-13 ENCOUNTER — Other Ambulatory Visit: Payer: Self-pay | Admitting: Family Medicine

## 2023-03-13 DIAGNOSIS — K219 Gastro-esophageal reflux disease without esophagitis: Secondary | ICD-10-CM

## 2023-03-13 DIAGNOSIS — N5201 Erectile dysfunction due to arterial insufficiency: Secondary | ICD-10-CM

## 2023-04-30 ENCOUNTER — Other Ambulatory Visit: Payer: Self-pay | Admitting: Family Medicine

## 2023-05-13 ENCOUNTER — Telehealth: Payer: Self-pay | Admitting: Family Medicine

## 2023-05-13 ENCOUNTER — Other Ambulatory Visit: Payer: Self-pay | Admitting: Family Medicine

## 2023-05-13 NOTE — Telephone Encounter (Signed)
Called pharmacy & Carisoprolol is going thru ins for $6 & pt picked up

## 2023-05-21 ENCOUNTER — Encounter: Payer: Self-pay | Admitting: Internal Medicine

## 2023-05-22 ENCOUNTER — Other Ambulatory Visit: Payer: Self-pay | Admitting: Family Medicine

## 2023-05-22 DIAGNOSIS — G479 Sleep disorder, unspecified: Secondary | ICD-10-CM

## 2023-06-02 ENCOUNTER — Other Ambulatory Visit: Payer: Self-pay | Admitting: Medical

## 2023-06-22 ENCOUNTER — Other Ambulatory Visit: Payer: Self-pay | Admitting: Family Medicine

## 2023-06-22 DIAGNOSIS — N5201 Erectile dysfunction due to arterial insufficiency: Secondary | ICD-10-CM

## 2023-06-22 DIAGNOSIS — K219 Gastro-esophageal reflux disease without esophagitis: Secondary | ICD-10-CM

## 2023-06-29 ENCOUNTER — Other Ambulatory Visit: Payer: Self-pay | Admitting: Medical

## 2023-07-12 ENCOUNTER — Other Ambulatory Visit: Payer: Self-pay | Admitting: Family Medicine

## 2023-07-15 ENCOUNTER — Other Ambulatory Visit: Payer: Self-pay | Admitting: Family Medicine

## 2023-07-15 DIAGNOSIS — G479 Sleep disorder, unspecified: Secondary | ICD-10-CM

## 2023-07-30 ENCOUNTER — Other Ambulatory Visit: Payer: Self-pay | Admitting: Medical

## 2023-08-26 ENCOUNTER — Other Ambulatory Visit: Payer: Self-pay | Admitting: Family Medicine

## 2023-08-26 DIAGNOSIS — K219 Gastro-esophageal reflux disease without esophagitis: Secondary | ICD-10-CM

## 2023-08-26 DIAGNOSIS — N5201 Erectile dysfunction due to arterial insufficiency: Secondary | ICD-10-CM

## 2023-08-28 ENCOUNTER — Other Ambulatory Visit: Payer: Self-pay | Admitting: Family Medicine

## 2023-09-10 ENCOUNTER — Ambulatory Visit: Payer: 59 | Admitting: Family Medicine

## 2023-09-10 DIAGNOSIS — I10 Essential (primary) hypertension: Secondary | ICD-10-CM

## 2023-09-10 DIAGNOSIS — D692 Other nonthrombocytopenic purpura: Secondary | ICD-10-CM

## 2023-09-10 DIAGNOSIS — I491 Atrial premature depolarization: Secondary | ICD-10-CM

## 2023-09-10 DIAGNOSIS — K219 Gastro-esophageal reflux disease without esophagitis: Secondary | ICD-10-CM

## 2023-09-10 DIAGNOSIS — Z87442 Personal history of urinary calculi: Secondary | ICD-10-CM

## 2023-09-10 DIAGNOSIS — L649 Androgenic alopecia, unspecified: Secondary | ICD-10-CM

## 2023-09-10 DIAGNOSIS — I8393 Asymptomatic varicose veins of bilateral lower extremities: Secondary | ICD-10-CM

## 2023-09-10 DIAGNOSIS — Z13220 Encounter for screening for lipoid disorders: Secondary | ICD-10-CM

## 2023-09-10 DIAGNOSIS — N5201 Erectile dysfunction due to arterial insufficiency: Secondary | ICD-10-CM

## 2023-09-10 DIAGNOSIS — Z85820 Personal history of malignant melanoma of skin: Secondary | ICD-10-CM

## 2023-09-10 DIAGNOSIS — D508 Other iron deficiency anemias: Secondary | ICD-10-CM

## 2023-09-10 DIAGNOSIS — R7302 Impaired glucose tolerance (oral): Secondary | ICD-10-CM

## 2023-09-10 DIAGNOSIS — M5090 Cervical disc disorder, unspecified, unspecified cervical region: Secondary | ICD-10-CM

## 2023-09-10 DIAGNOSIS — G4486 Cervicogenic headache: Secondary | ICD-10-CM

## 2023-09-10 DIAGNOSIS — G4709 Other insomnia: Secondary | ICD-10-CM

## 2023-09-17 ENCOUNTER — Ambulatory Visit (INDEPENDENT_AMBULATORY_CARE_PROVIDER_SITE_OTHER): Admitting: Family Medicine

## 2023-09-17 VITALS — BP 126/88 | HR 80

## 2023-09-17 DIAGNOSIS — Z0000 Encounter for general adult medical examination without abnormal findings: Secondary | ICD-10-CM | POA: Diagnosis not present

## 2023-09-17 DIAGNOSIS — D508 Other iron deficiency anemias: Secondary | ICD-10-CM

## 2023-09-17 DIAGNOSIS — Z8546 Personal history of malignant neoplasm of prostate: Secondary | ICD-10-CM | POA: Insufficient documentation

## 2023-09-17 DIAGNOSIS — D692 Other nonthrombocytopenic purpura: Secondary | ICD-10-CM

## 2023-09-17 DIAGNOSIS — M48061 Spinal stenosis, lumbar region without neurogenic claudication: Secondary | ICD-10-CM | POA: Diagnosis not present

## 2023-09-17 DIAGNOSIS — H9113 Presbycusis, bilateral: Secondary | ICD-10-CM

## 2023-09-17 DIAGNOSIS — K219 Gastro-esophageal reflux disease without esophagitis: Secondary | ICD-10-CM

## 2023-09-17 DIAGNOSIS — G4486 Cervicogenic headache: Secondary | ICD-10-CM

## 2023-09-17 DIAGNOSIS — R7302 Impaired glucose tolerance (oral): Secondary | ICD-10-CM

## 2023-09-17 DIAGNOSIS — I10 Essential (primary) hypertension: Secondary | ICD-10-CM | POA: Diagnosis not present

## 2023-09-17 DIAGNOSIS — Z13220 Encounter for screening for lipoid disorders: Secondary | ICD-10-CM

## 2023-09-17 DIAGNOSIS — M5090 Cervical disc disorder, unspecified, unspecified cervical region: Secondary | ICD-10-CM

## 2023-09-17 DIAGNOSIS — M5489 Other dorsalgia: Secondary | ICD-10-CM

## 2023-09-17 DIAGNOSIS — G8929 Other chronic pain: Secondary | ICD-10-CM

## 2023-09-17 DIAGNOSIS — Z85820 Personal history of malignant melanoma of skin: Secondary | ICD-10-CM

## 2023-09-17 LAB — POCT GLYCOSYLATED HEMOGLOBIN (HGB A1C): Hemoglobin A1C: 6.1 % — AB (ref 4.0–5.6)

## 2023-09-17 LAB — CBC WITH DIFFERENTIAL/PLATELET

## 2023-09-17 LAB — LIPID PANEL

## 2023-09-17 NOTE — Progress Notes (Signed)
 Joseph [name] is a 68 y.o. male who presents for annual wellness visit and follow-up on chronic medical conditions.  He continues to be followed for his underlying scoliosis as well as spinal stenosis and cervical disc disease.  He recently had an epidural injection but is still having some difficulty with that.  He has stopped taking his Celebrex  the recommendation cardiovascular risk and also renal issues.  He now has codeine to help with the back pain if it becomes necessary.  He continues on finasteride .  He is also taking losartan  and metoprolol .  He sees dermatology regularly for his underlying melanoma.  He also has a history of prostate cancer as well as kidney stones.  He does complain of some difficulty with clearing the throat and has been using an allergy med with no real benefit. He also has a history of glucose intolerance.  Immunizations and Health Maintenance Immunization History  Administered Date(s) Administered   Fluad Quad(high Dose 65+) 01/10/2020   Fluad Trivalent(High Dose 65+) 12/18/2022   Influenza Split 04/26/2011   Influenza,inj,Quad PF,6+ Mos 02/12/2013, 01/17/2015, 03/20/2017   PFIZER Comirnaty(Gray Top)Covid-19 Tri-Sucrose Vaccine 08/30/2020   PFIZER(Purple Top)SARS-COV-2 Vaccination 04/20/2019, 05/11/2019, 12/09/2019   Pfizer(Comirnaty)Fall Seasonal Vaccine 12 years and older 12/18/2022   Pneumococcal Conjugate-13 08/30/2020   Pneumococcal Polysaccharide-23 08/31/2021   Td 11/02/2015   Tdap 03/26/2005   Zoster Recombinant(Shingrix) 08/27/2019, 12/01/2019   Zoster, Live 01/17/2015   Health Maintenance Due  Topic Date Due   COVID-19 Vaccine (6 - Pfizer risk 2024-25 season) 06/17/2023    Last colonoscopy: 01/18/2015 Last PSA: 09/12/2023 Dentist: no regular dental care Ophtho: November 2024 Exercise: yard work & house work no other exercise.   Other doctors caring for patient include:Joseph [name]  The 10-year ASCVD risk score (Arnett DK, et  al., 2019) is: 18.5%   Values used to calculate the score:     Age: 57 years     Clincally relevant sex: Male     Is Non-Hispanic African American: No     Diabetic: No     Tobacco smoker: No     Systolic Blood Pressure: 126 mmHg     Is BP treated: Yes     HDL Cholesterol: 53 mg/dL     Total Cholesterol: 199 mg/dL  Advanced Directives:No .Info given Does Patient Have a Medical Advance Directive?: No Would patient like information on creating a medical advance directive?: No - Patient declined  Depression screen:  See questionnaire below.        09/17/2023    9:31 AM 09/05/2022    2:52 PM 08/31/2021    3:09 PM 08/30/2020    3:38 PM 08/27/2019    2:59 PM  Depression screen PHQ 2/9  Decreased Interest 1 0 0 0 1  Down, Depressed, Hopeless 1 1 0 0 3  PHQ - 2 Score 2 1 0 0 4  Altered sleeping 0    3  Tired, decreased energy 1    2  Change in appetite 1    0  Feeling bad or failure about yourself  1    2  Trouble concentrating 0    1  Moving slowly or fidgety/restless 0    1  Suicidal thoughts 0    1  PHQ-9 Score 5    14    Fall Screen: See Questionaire below.      09/17/2023    9:31 AM 11/27/2022    3:29 PM 09/05/2022    2:52 PM 05/24/2021  3:26 PM 08/30/2020    3:38 PM  Fall Risk   Falls in the past year? 0 0 0 0 0  Number falls in past yr: 0 0 0 0 0  Injury with Fall? 0 0 0 0 0  Risk for fall due to : No Fall Risks  No Fall Risks  No Fall Risks  Follow up  Falls evaluation completed Falls evaluation completed  Falls evaluation completed      Data saved with a previous flowsheet row definition    ADL screen:  See questionnaire below.  Functional Status Survey: Is the patient deaf or have difficulty hearing?: Yes (wears hearing aids) Does the patient have difficulty seeing, even when wearing glasses/contacts?: No Does the patient have difficulty concentrating, remembering, or making decisions?: No Does the patient have difficulty walking or climbing stairs?: Yes Does the  patient have difficulty dressing or bathing?: No Does the patient have difficulty doing errands alone such as visiting a doctor's office or shopping?: No   Review of Systems  Constitutional: -, -unexpected weight change, -anorexia, -fatigue Allergy: -sneezing, -itching, -congestion Dermatology: denies changing moles, rash, lumps ENT: -runny nose, -ear pain, -sore throat,  Cardiology:  -chest pain, -palpitations, -orthopnea, Respiratory: -cough, -shortness of breath, -dyspnea on exertion, -wheezing,  Gastroenterology: -abdominal pain, -nausea, -vomiting, -diarrhea, -constipation, -dysphagia Hematology: -bleeding or bruising problems Musculoskeletal: -arthralgias, -myalgias, -joint swelling, -back pain, - Ophthalmology: -vision changes,  Urology: -dysuria, -difficulty urinating,  -urinary frequency, -urgency, incontinence Neurology: -, -numbness, , -memory loss, -falls, -dizziness    PHYSICAL EXAM:  BP 126/88   Pulse 80   General Appearance: Alert, cooperative, no distress, appears stated age Head: Normocephalic, without obvious abnormality, atraumatic Eyes: PERRL, conjunctiva/corneas clear, EOM's intact, Ears: Normal TM's and external ear canals Nose: Nares normal, mucosa normal, no drainage or sinus   tenderness Throat: Lips, mucosa, and tongue normal; teeth and gums normal Neck: Supple, no lymphadenopathy, thyroid:no enlargement/tenderness/nodules; no carotid bruit or JVD Lungs: Clear to auscultation bilaterally without wheezes, rales or ronchi; respirations unlabored Heart: Regular rate and rhythm, S1 and S2 normal, no murmur, rub or gallop Skin: Skin color, texture, purpuric lesions noted.   Lymph nodes: Cervical, supraclavicular, and axillary nodes normal Neurologic: CNII-XII intact, normal strength, sensation and gait; reflexes 2+ and symmetric throughout   Psych: Normal mood, affect, hygiene and grooming Hemoglobin A1c is 6.1 ASSESSMENT/PLAN: Routine general medical  examination at a health care facility  Spinal stenosis of lumbar region without neurogenic claudication  Senile purpura (HCC)  Other iron deficiency anemia - Plan: CBC with Differential/Platelet  History of melanoma  Primary hypertension - Plan: CBC with Differential/Platelet, Comprehensive metabolic panel with GFR  Glucose intolerance (impaired glucose tolerance) - Plan: POCT glycosylated hemoglobin (Hb A1C)  Gastroesophageal reflux disease without esophagitis  Other chronic back pain  Cervicogenic headache  Cervical disc disease  History of prostate cancer  Presbycusis of both ears  Screening for lipid disorders - Plan: Lipid panel   He will continue on his present medication regimen.  He is to return here with his blood pressure cuff and measure it against ours so we can make sure we get an accurate reading.  Also recommend he get RSV shot.  Flu and COVID in the winter.  Briefly discussed the chronic back pain and its psychological effects on him.  He seems to be handling this fairly well.  Encouraged physical activity is much as tolerated with the aches and pains that he is having.  He will continue to  be followed by orthopedics for his neck and back pain. Discussed Immunization recommendations discussed.  Colonoscopy recommendations reviewed.   Medicare Attestation I have personally reviewed: The patient's medical and social history Their use of alcohol , tobacco or illicit drugs Their current medications and supplements The patient's functional ability including ADLs,fall risks, home safety risks, cognitive, and hearing and visual impairment Diet and physical activities Evidence for depression or mood disorders  The patient's weight, height, and BMI have been recorded in the chart.  I have made referrals, counseling, and provided education to the patient based on review of the above and I have provided the patient with a written personalized care plan for preventive  services.     Norleen Jobs, MD   09/17/2023

## 2023-09-18 ENCOUNTER — Ambulatory Visit: Payer: Self-pay | Admitting: Family Medicine

## 2023-09-18 LAB — CBC WITH DIFFERENTIAL/PLATELET
Basos: 1 %
EOS (ABSOLUTE): 0.1 10*3/uL (ref 0.0–0.2)
Eos: 3 %
Hematocrit: 38 % (ref 37.5–51.0)
Hemoglobin: 12.5 g/dL — ABNORMAL LOW (ref 13.0–17.7)
Immature Granulocytes: 0 %
Immature Granulocytes: 0 10*3/uL (ref 0.0–0.1)
Lymphs: 36 %
MCH: 31.4 pg (ref 26.6–33.0)
MCHC: 32.9 g/dL (ref 31.5–35.7)
MCV: 96 fL (ref 79–97)
Monocytes Absolute: 0.2 10*3/uL (ref 0.0–0.4)
Monocytes Absolute: 0.9 10*3/uL (ref 0.1–0.9)
Monocytes: 16 %
Neutrophils Absolute: 1.9 10*3/uL (ref 0.7–3.1)
Neutrophils Absolute: 2.4 10*3/uL (ref 1.4–7.0)
Neutrophils: 44 %
Platelets: 119 10*3/uL — ABNORMAL LOW (ref 150–450)
RBC: 3.98 x10E6/uL — ABNORMAL LOW (ref 4.14–5.80)
RDW: 12.4 % (ref 11.6–15.4)
WBC: 5.4 10*3/uL (ref 3.4–10.8)

## 2023-09-18 LAB — COMPREHENSIVE METABOLIC PANEL WITH GFR
ALT: 19 IU/L (ref 0–44)
AST: 29 IU/L (ref 0–40)
Albumin: 4.3 g/dL (ref 3.9–4.9)
Alkaline Phosphatase: 98 IU/L (ref 44–121)
BUN/Creatinine Ratio: 15 (ref 10–24)
BUN: 18 mg/dL (ref 8–27)
Bilirubin Total: 0.3 mg/dL (ref 0.0–1.2)
CO2: 20 mmol/L (ref 20–29)
Calcium: 9.3 mg/dL (ref 8.6–10.2)
Chloride: 98 mmol/L (ref 96–106)
Creatinine, Ser: 1.22 mg/dL (ref 0.76–1.27)
Globulin, Total: 2.3 g/dL (ref 1.5–4.5)
Glucose: 99 mg/dL (ref 70–99)
Potassium: 4.3 mmol/L (ref 3.5–5.2)
Sodium: 136 mmol/L (ref 134–144)
Total Protein: 6.6 g/dL (ref 6.0–8.5)
eGFR: 64 mL/min/{1.73_m2} (ref >59)

## 2023-09-18 LAB — LIPID PANEL
Cholesterol, Total: 199 mg/dL (ref 100–199)
HDL: 53 mg/dL (ref >39)
LDL CALC COMMENT:: 3.8 ratio (ref 0.0–5.0)
LDL Chol Calc (NIH): 133 mg/dL — ABNORMAL HIGH (ref 0–99)
Triglycerides: 74 mg/dL (ref 0–149)
VLDL Cholesterol Cal: 13 mg/dL (ref 5–40)

## 2023-09-18 NOTE — Progress Notes (Signed)
left voicemail for patient

## 2023-09-19 ENCOUNTER — Other Ambulatory Visit: Payer: Self-pay | Admitting: Family Medicine

## 2023-09-19 MED ORDER — ATORVASTATIN CALCIUM 20 MG PO TABS: 20.0000 mg | ORAL_TABLET | Freq: Every day | ORAL | 3 refills | Status: AC

## 2023-09-19 NOTE — Addendum Note (Signed)
 Addended by: JOYCE NORLEEN BROCKS on: 09/19/2023 04:49 PM   Modules accepted: Orders

## 2023-09-29 ENCOUNTER — Other Ambulatory Visit: Payer: Self-pay | Admitting: Family Medicine

## 2023-09-29 DIAGNOSIS — N5201 Erectile dysfunction due to arterial insufficiency: Secondary | ICD-10-CM

## 2023-09-29 DIAGNOSIS — K219 Gastro-esophageal reflux disease without esophagitis: Secondary | ICD-10-CM

## 2023-09-30 ENCOUNTER — Other Ambulatory Visit

## 2023-09-30 ENCOUNTER — Other Ambulatory Visit: Payer: Self-pay | Admitting: Family Medicine

## 2023-09-30 DIAGNOSIS — I491 Atrial premature depolarization: Secondary | ICD-10-CM

## 2023-09-30 DIAGNOSIS — I10 Essential (primary) hypertension: Secondary | ICD-10-CM

## 2023-10-02 ENCOUNTER — Other Ambulatory Visit: Payer: Self-pay | Admitting: Family Medicine

## 2023-10-02 DIAGNOSIS — G479 Sleep disorder, unspecified: Secondary | ICD-10-CM

## 2023-10-02 NOTE — Telephone Encounter (Signed)
 Last apt 09/17/23.

## 2023-10-27 ENCOUNTER — Other Ambulatory Visit: Payer: Self-pay | Admitting: Family Medicine

## 2023-10-31 ENCOUNTER — Other Ambulatory Visit: Payer: Self-pay | Admitting: Family Medicine

## 2023-10-31 DIAGNOSIS — G8929 Other chronic pain: Secondary | ICD-10-CM

## 2023-11-04 ENCOUNTER — Other Ambulatory Visit: Payer: Self-pay | Admitting: Family Medicine

## 2023-11-04 DIAGNOSIS — K219 Gastro-esophageal reflux disease without esophagitis: Secondary | ICD-10-CM

## 2023-11-04 DIAGNOSIS — N5201 Erectile dysfunction due to arterial insufficiency: Secondary | ICD-10-CM

## 2023-11-17 ENCOUNTER — Other Ambulatory Visit: Payer: Self-pay | Admitting: Family Medicine

## 2023-11-18 NOTE — Telephone Encounter (Signed)
 Is this okay to refill?

## 2023-11-28 NOTE — Progress Notes (Signed)
 NEUROLOGY FOLLOW UP OFFICE NOTE  Joseph [name] [id number]  Assessment/Plan:   1.  Cervicogenic headache 2.  Cervical spondylosis with degenerative disc disease and spinal stenosis 3.  Elevated blood pressure reading    1.  nortriptyline  50mg  at bedtime 2. Follow up in one year   Subjective:  Joseph [name] is a 68 year old right-handed man who follows up for cervicogenic headache with cervical spondylosis.   UPDATE: Current medication:  nortriptyline  50mg  at bedtime Headaches overall stable.  He retired last month, so no longer sitting at the computer screen all of the time, which has helped.  He may have a headache no more than 2 times a month.  Neck stretches help.     back pain - posture and sciatic - in PT seeing surgeon Some tightness from time to time not doing traction   HISTORY: In August 2016, he began experiencing bilateral posterior neck pain radiating up to the upper cervical region and down to between the shoulder blades.  He describes it as an aching pain.  About 3 days a week, it is accompanied by a headache in a band-like distribution.  He also reports burning sensation at the C7 spinal process.  He reports that a week prior to onset, he performed manual labor in the yard, such as chopping wood and pushing a wheel barrel of dirt.  He has undergone 5 weeks of PT with moderate relief.  The neck pain seems a little worse after a session.  Sometimes he notes numbness in the left arm and hand.  Plain films of cervical spine demonstrated multilevel degenerative disc disease with multiple osseous neural foraminal narrowing, most pronounced at C4-5, C5-6 and C6-7.  Craniocervical junctuion was unremarkable.  MRI of cervical spine from 01/14/15.  It reveals multilevel spondylosis moderate left C3-4 foraminal stenosis, moderate to severe bilateral C5-6 foraminal narrowing and severe left and moderate right C6-7 foraminal stenosis.  There is mild central stenosis with moderate left  central stenosis at C6-7. Repeat MRI of cervical spine from 06/12/2018 showed progressed multilevel degenerative changes with neural foraminal stenosis as well as moderate canal stenosis at C3-4.  He was evaluated by spine surgeon who sent him to Dr. Letha of pain management.  He received an epidural which was helpful.   In Righ[place] 2021, he reported very brief disorientation while driving.  At an intersection, it may take a couple of seconds to get his bearings.  It really only occurs at night, not during the day.  Also, he may sometimes have word-finding difficulty or can't recall somebody's name right away. B12 and TSH from March 2021 were 441 and 1.20 respectively.    PAST MEDICAL HISTORY: Past Medical History:  Diagnosis Date   Alopecia    DDD (degenerative disc disease)    Degenerative scoliosis 07/07/2018   Diverticulosis    ED (erectile dysfunction)    GERD (gastroesophageal reflux disease)    History of kidney stones    Melanoma (HCC)    sees Dr. Shona.  back and chest   Neurogenic claudication 07/07/2018   Spinal stenosis of lumbar region 07/07/2018    MEDICATIONS: Current Outpatient Medications on File Prior to Visit  Medication Sig Dispense Refill   atorvastatin  (LIPITOR) 20 MG tablet Take 1 tablet (20 mg total) by mouth daily. 90 tablet 3   BELSOMRA  15 MG TABS TAKE 1 TABLET BY MOUTH EVERY DAY AT BEDTIME AS NEEDED 30 tablet 1   Camphor-Menthol -Methyl Sal (SALONPAS) 3.03-31-08 % PTCH Apply 1  patch topically daily as needed (pain).     carisoprodol  (SOMA ) 350 MG tablet TAKE 1 TABLET BY MOUTH DAILY AS NEEDED FOR MUSCLE SPASMS. 30 tablet 1   celecoxib  (CELEBREX ) 200 MG capsule TAKE 1 CAPSULE BY MOUTH TWICE A DAY 60 capsule 0   co-enzyme Q-10 30 MG capsule Take 30 mg by mouth daily.     Cyanocobalamin (B-12 PO) Take by mouth.     Echinacea 400 MG CAPS Take 400 mg by mouth daily. (Patient not taking: Reported on 09/17/2023)     ferrous sulfate  325 (65 FE) MG tablet ferrous sulfate  325  mg (65 mg iron) tablet     finasteride  (PROPECIA ) 1 MG tablet Take 1 tablet (1 mg total) by mouth at bedtime. 90 tablet 3   losartan  (COZAAR ) 50 MG tablet TAKE 1 TABLET BY MOUTH EVERY DAY 90 tablet 3   MELATONIN PO Take 10 mg by mouth at bedtime as needed (sleep).     metoprolol  succinate (TOPROL -XL) 25 MG 24 hr tablet TAKE 1 TABLET BY MOUTH EVERY DAY WITH OR IMMEDIATELY FOLLOWING A MEAL 30 tablet 11   Misc Natural Products (GLUCOSAMINE CHOND CMP TRIPLE) TABS Take 1 tablet by mouth in the morning and at bedtime.     Multiple Vitamins-Minerals (MULTIVITAMIN WITH MINERALS) tablet Take 1 tablet by mouth daily.     nortriptyline  (PAMELOR ) 50 MG capsule TAKE 1 CAPSULE BY MOUTH AT BEDTIME. 90 capsule 3   Omega-3 Fatty Acids (FISH OIL PO) Take by mouth.     selenium 50 MCG TABS tablet Take 50 mcg by mouth daily.     tadalafil  (CIALIS ) 20 MG tablet TAKE ONE TABLET BY MOUTH DAILY AS NEEDED FOR ERECTILE DYSFUNCTION 30 tablet 0   Turmeric 500 MG CAPS Take 500 mg by mouth daily.     VITAMIN D PO Take 5,000 Units by mouth daily.     vitamin E 180 MG (400 UNITS) capsule Take 400 Units by mouth daily. (Patient not taking: Reported on 09/17/2023)     Zinc  50 MG TABS Take 50 mg by mouth daily.     No current facility-administered medications on file prior to visit.    ALLERGIES: Allergies  Allergen Reactions   Daypro [Oxaprozin]     GASTRIC PROBLEMS   Methadone Itching    FAMILY HISTORY: Family History  Problem Relation Age of Onset   Transient ischemic attack Father    Multiple myeloma Father    Bladder Cancer Father    Celiac disease Mother    Scoliosis Mother    Colon cancer Neg Hx    Esophageal cancer Neg Hx    Liver disease Neg Hx    Kidney disease Neg Hx    Colon polyps Neg Hx       Objective:  Blood pressure 110/69, pulse 96, height 6' 3 (1.905 m), weight 202 lb (91.6 kg), SpO2 96%. General: No acute distress.  Patient appears well-groomed.   Head:  Normocephalic/atraumatic Neck:   Supple.  No paraspinal tenderness.  Full range of motion. Heart:  Regular rate and rhythm. Neuro:  Alert and oriented.  Speech fluent and not dysarthric.  Language intact.  CN II-XII intact.  Bulk and tone normal.  Muscle strength 5/5 throughout.  Sensation to light touch intact.  Deep tendon reflexes 2+ throughout, toes downgoing.  Gait normal.  Romberg negative.    Juliene Dunnings, DO  CC: Norleen Jobs, MD

## 2023-11-29 ENCOUNTER — Ambulatory Visit (INDEPENDENT_AMBULATORY_CARE_PROVIDER_SITE_OTHER): Payer: Medicare Other | Admitting: Neurology

## 2023-11-29 VITALS — BP 110/69 | HR 96 | Ht 75.0 in | Wt 202.0 lb

## 2023-11-29 DIAGNOSIS — G4486 Cervicogenic headache: Secondary | ICD-10-CM | POA: Diagnosis not present

## 2023-11-30 ENCOUNTER — Encounter: Payer: Self-pay | Admitting: Neurology

## 2023-12-01 ENCOUNTER — Other Ambulatory Visit: Payer: Self-pay | Admitting: Family Medicine

## 2023-12-01 DIAGNOSIS — N5201 Erectile dysfunction due to arterial insufficiency: Secondary | ICD-10-CM

## 2023-12-01 DIAGNOSIS — K219 Gastro-esophageal reflux disease without esophagitis: Secondary | ICD-10-CM

## 2023-12-02 ENCOUNTER — Other Ambulatory Visit: Payer: Self-pay | Admitting: Family Medicine

## 2023-12-02 DIAGNOSIS — G479 Sleep disorder, unspecified: Secondary | ICD-10-CM

## 2024-01-09 ENCOUNTER — Other Ambulatory Visit: Payer: Self-pay | Admitting: Family Medicine

## 2024-01-09 DIAGNOSIS — K219 Gastro-esophageal reflux disease without esophagitis: Secondary | ICD-10-CM

## 2024-01-09 DIAGNOSIS — N5201 Erectile dysfunction due to arterial insufficiency: Secondary | ICD-10-CM

## 2024-01-16 ENCOUNTER — Other Ambulatory Visit: Payer: Self-pay | Admitting: Family Medicine

## 2024-02-05 ENCOUNTER — Other Ambulatory Visit: Payer: Self-pay | Admitting: Family Medicine

## 2024-02-05 DIAGNOSIS — G479 Sleep disorder, unspecified: Secondary | ICD-10-CM

## 2024-02-07 ENCOUNTER — Other Ambulatory Visit: Payer: Self-pay | Admitting: Family Medicine

## 2024-02-07 DIAGNOSIS — K219 Gastro-esophageal reflux disease without esophagitis: Secondary | ICD-10-CM

## 2024-02-07 DIAGNOSIS — N5201 Erectile dysfunction due to arterial insufficiency: Secondary | ICD-10-CM

## 2024-03-08 ENCOUNTER — Other Ambulatory Visit: Payer: Self-pay | Admitting: Family Medicine

## 2024-03-08 DIAGNOSIS — N5201 Erectile dysfunction due to arterial insufficiency: Secondary | ICD-10-CM

## 2024-03-08 DIAGNOSIS — K219 Gastro-esophageal reflux disease without esophagitis: Secondary | ICD-10-CM

## 2024-03-17 ENCOUNTER — Other Ambulatory Visit: Payer: Self-pay | Admitting: Family Medicine

## 2024-03-17 MED ORDER — CARISOPRODOL 350 MG PO TABS: 350.0000 mg | ORAL_TABLET | Freq: Every day | ORAL | 1 refills | Status: AC

## 2024-03-17 NOTE — Telephone Encounter (Signed)
 Copied from CRM #[id number]. Topic: Clinical - Medication Refill >> Mar 17, 2024  3:25 PM Kevelyn M wrote: Medication: carisoprodol  (SOMA ) 350 MG tablet  Has the patient contacted their pharmacy? Yes (Agent: If no, request that the patient contact the pharmacy for the refill. If patient does not wish to contact the pharmacy document the reason why and proceed with request.) (Agent: If yes, when and what did the pharmacy advise?)  This is the patient's preferred pharmacy:  [id number] - [place], [place] - [address] AT [address] OF [facility] [address] [place] [address] Phone: [phone] Fax: [phone]  Is this the correct pharmacy for this prescription? Yes If no, delete pharmacy and type the correct one.   Has the prescription been filled recently? No  Is the patient out of the medication? No, one left  Has the patient been seen for an appointment in the last year OR does the patient have an upcoming appointment? Yes  Can we respond through MyChart? No  Agent: Please be advised that Rx refills may take up to 3 business days. We ask that you follow-up with your pharmacy.

## 2024-04-07 LAB — OPHTHALMOLOGY REPORT-SCANNED

## 2024-04-11 ENCOUNTER — Other Ambulatory Visit: Payer: Self-pay | Admitting: Family Medicine

## 2024-04-11 DIAGNOSIS — G479 Sleep disorder, unspecified: Secondary | ICD-10-CM

## 2024-04-27 ENCOUNTER — Other Ambulatory Visit: Payer: Self-pay | Admitting: Family Medicine

## 2024-04-27 DIAGNOSIS — K219 Gastro-esophageal reflux disease without esophagitis: Secondary | ICD-10-CM

## 2024-04-27 DIAGNOSIS — N5201 Erectile dysfunction due to arterial insufficiency: Secondary | ICD-10-CM

## 2024-09-17 ENCOUNTER — Ambulatory Visit: Payer: Self-pay | Admitting: Family Medicine

## 2024-12-01 ENCOUNTER — Ambulatory Visit: Admitting: Neurology
